# Patient Record
Sex: Female | Born: 1945 | Race: White | Hispanic: No | Marital: Married | Smoking: Former smoker
Health system: Southern US, Community
[De-identification: ages and names within clinical notes are randomized; demographics above are authoritative.]

## PROBLEM LIST (undated history)

## (undated) DIAGNOSIS — N289 Disorder of kidney and ureter, unspecified: Secondary | ICD-10-CM

## (undated) DIAGNOSIS — E039 Hypothyroidism, unspecified: Secondary | ICD-10-CM

## (undated) DIAGNOSIS — F419 Anxiety disorder, unspecified: Secondary | ICD-10-CM

## (undated) DIAGNOSIS — K648 Other hemorrhoids: Secondary | ICD-10-CM

## (undated) DIAGNOSIS — M40209 Unspecified kyphosis, site unspecified: Secondary | ICD-10-CM

## (undated) DIAGNOSIS — N184 Chronic kidney disease, stage 4 (severe): Secondary | ICD-10-CM

## (undated) DIAGNOSIS — M1990 Unspecified osteoarthritis, unspecified site: Secondary | ICD-10-CM

## (undated) DIAGNOSIS — K5790 Diverticulosis of intestine, part unspecified, without perforation or abscess without bleeding: Secondary | ICD-10-CM

## (undated) DIAGNOSIS — I10 Essential (primary) hypertension: Secondary | ICD-10-CM

## (undated) DIAGNOSIS — M549 Dorsalgia, unspecified: Secondary | ICD-10-CM

## (undated) DIAGNOSIS — I4891 Unspecified atrial fibrillation: Secondary | ICD-10-CM

## (undated) DIAGNOSIS — D369 Benign neoplasm, unspecified site: Secondary | ICD-10-CM

## (undated) DIAGNOSIS — F319 Bipolar disorder, unspecified: Secondary | ICD-10-CM

## (undated) DIAGNOSIS — K219 Gastro-esophageal reflux disease without esophagitis: Secondary | ICD-10-CM

## (undated) HISTORY — PX: TUBAL LIGATION: SHX77

## (undated) HISTORY — PX: APPENDECTOMY: SHX54

## (undated) HISTORY — DX: Diverticulosis of intestine, part unspecified, without perforation or abscess without bleeding: K57.90

## (undated) HISTORY — PX: TONSILLECTOMY: SUR1361

## (undated) HISTORY — PX: CHOLECYSTECTOMY: SHX55

## (undated) HISTORY — DX: Hypothyroidism, unspecified: E03.9

## (undated) HISTORY — DX: Unspecified atrial fibrillation: I48.91

## (undated) HISTORY — DX: Benign neoplasm, unspecified site: D36.9

## (undated) HISTORY — DX: Other hemorrhoids: K64.8

---

## 1997-08-05 ENCOUNTER — Encounter: Admission: RE | Admit: 1997-08-05 | Discharge: 1997-11-03 | Payer: Self-pay | Admitting: Neurology

## 1998-01-23 ENCOUNTER — Inpatient Hospital Stay (HOSPITAL_COMMUNITY): Admission: EM | Admit: 1998-01-23 | Discharge: 1998-01-25 | Payer: Self-pay | Admitting: Emergency Medicine

## 1998-01-23 ENCOUNTER — Encounter: Payer: Self-pay | Admitting: General Surgery

## 1998-01-24 ENCOUNTER — Encounter: Payer: Self-pay | Admitting: General Surgery

## 1998-09-04 ENCOUNTER — Encounter: Payer: Self-pay | Admitting: Emergency Medicine

## 1998-09-04 ENCOUNTER — Emergency Department (HOSPITAL_COMMUNITY): Admission: EM | Admit: 1998-09-04 | Discharge: 1998-09-04 | Payer: Self-pay | Admitting: Emergency Medicine

## 1999-01-05 ENCOUNTER — Emergency Department (HOSPITAL_COMMUNITY): Admission: EM | Admit: 1999-01-05 | Discharge: 1999-01-05 | Payer: Self-pay | Admitting: Emergency Medicine

## 1999-01-05 ENCOUNTER — Encounter: Payer: Self-pay | Admitting: Emergency Medicine

## 2000-09-26 ENCOUNTER — Ambulatory Visit (HOSPITAL_COMMUNITY): Admission: RE | Admit: 2000-09-26 | Discharge: 2000-09-26 | Payer: Self-pay | Admitting: Internal Medicine

## 2000-09-26 ENCOUNTER — Encounter (INDEPENDENT_AMBULATORY_CARE_PROVIDER_SITE_OTHER): Payer: Self-pay | Admitting: Specialist

## 2000-10-12 ENCOUNTER — Inpatient Hospital Stay (HOSPITAL_COMMUNITY): Admission: EM | Admit: 2000-10-12 | Discharge: 2000-10-14 | Payer: Self-pay | Admitting: Emergency Medicine

## 2000-10-12 ENCOUNTER — Encounter: Payer: Self-pay | Admitting: Emergency Medicine

## 2003-03-16 ENCOUNTER — Ambulatory Visit (HOSPITAL_COMMUNITY): Admission: RE | Admit: 2003-03-16 | Discharge: 2003-03-16 | Payer: Self-pay | Admitting: Internal Medicine

## 2003-10-20 ENCOUNTER — Ambulatory Visit (HOSPITAL_COMMUNITY): Admission: RE | Admit: 2003-10-20 | Discharge: 2003-10-20 | Payer: Self-pay | Admitting: Internal Medicine

## 2003-11-04 ENCOUNTER — Ambulatory Visit (HOSPITAL_COMMUNITY): Admission: RE | Admit: 2003-11-04 | Discharge: 2003-11-04 | Payer: Self-pay | Admitting: Internal Medicine

## 2004-01-02 ENCOUNTER — Inpatient Hospital Stay (HOSPITAL_COMMUNITY): Admission: EM | Admit: 2004-01-02 | Discharge: 2004-01-20 | Payer: Self-pay | Admitting: Emergency Medicine

## 2004-01-06 ENCOUNTER — Encounter (INDEPENDENT_AMBULATORY_CARE_PROVIDER_SITE_OTHER): Payer: Self-pay | Admitting: Cardiology

## 2004-01-14 ENCOUNTER — Ambulatory Visit: Payer: Self-pay | Admitting: Physical Medicine & Rehabilitation

## 2004-08-24 ENCOUNTER — Ambulatory Visit: Payer: Self-pay | Admitting: Internal Medicine

## 2004-09-08 ENCOUNTER — Observation Stay (HOSPITAL_COMMUNITY): Admission: EM | Admit: 2004-09-08 | Discharge: 2004-09-08 | Payer: Self-pay | Admitting: Emergency Medicine

## 2004-09-15 ENCOUNTER — Emergency Department (HOSPITAL_COMMUNITY): Admission: EM | Admit: 2004-09-15 | Discharge: 2004-09-15 | Payer: Self-pay | Admitting: Emergency Medicine

## 2004-09-16 ENCOUNTER — Emergency Department (HOSPITAL_COMMUNITY): Admission: EM | Admit: 2004-09-16 | Discharge: 2004-09-16 | Payer: Self-pay | Admitting: Emergency Medicine

## 2004-11-29 ENCOUNTER — Ambulatory Visit: Payer: Self-pay | Admitting: Internal Medicine

## 2004-12-06 ENCOUNTER — Ambulatory Visit: Payer: Self-pay | Admitting: Internal Medicine

## 2005-01-23 ENCOUNTER — Ambulatory Visit: Payer: Self-pay | Admitting: Internal Medicine

## 2005-03-26 ENCOUNTER — Ambulatory Visit: Payer: Self-pay | Admitting: Internal Medicine

## 2005-04-04 ENCOUNTER — Ambulatory Visit: Payer: Self-pay | Admitting: Internal Medicine

## 2005-04-13 ENCOUNTER — Ambulatory Visit: Payer: Self-pay | Admitting: Internal Medicine

## 2005-05-04 ENCOUNTER — Encounter: Payer: Self-pay | Admitting: Internal Medicine

## 2005-05-15 ENCOUNTER — Ambulatory Visit: Payer: Self-pay | Admitting: Internal Medicine

## 2005-07-24 ENCOUNTER — Ambulatory Visit: Payer: Self-pay | Admitting: Internal Medicine

## 2005-07-26 ENCOUNTER — Ambulatory Visit: Payer: Self-pay | Admitting: Cardiology

## 2005-08-03 ENCOUNTER — Ambulatory Visit: Payer: Self-pay | Admitting: Internal Medicine

## 2005-08-09 ENCOUNTER — Ambulatory Visit: Payer: Self-pay | Admitting: Internal Medicine

## 2005-10-09 ENCOUNTER — Ambulatory Visit: Payer: Self-pay | Admitting: Internal Medicine

## 2005-11-06 ENCOUNTER — Ambulatory Visit: Payer: Self-pay | Admitting: Internal Medicine

## 2005-12-24 ENCOUNTER — Ambulatory Visit: Payer: Self-pay | Admitting: Internal Medicine

## 2006-01-15 ENCOUNTER — Ambulatory Visit: Payer: Self-pay | Admitting: Internal Medicine

## 2006-01-25 ENCOUNTER — Ambulatory Visit: Payer: Self-pay | Admitting: Internal Medicine

## 2006-01-31 ENCOUNTER — Ambulatory Visit: Payer: Self-pay | Admitting: Internal Medicine

## 2006-02-07 ENCOUNTER — Ambulatory Visit: Payer: Self-pay | Admitting: Internal Medicine

## 2006-02-18 ENCOUNTER — Ambulatory Visit: Payer: Self-pay | Admitting: Internal Medicine

## 2006-02-18 LAB — CONVERTED CEMR LAB
BUN: 33 mg/dL — ABNORMAL HIGH (ref 6–23)
CO2: 23 meq/L (ref 19–32)
Calcium: 9.6 mg/dL (ref 8.4–10.5)
GFR calc non Af Amer: 27 mL/min
Glomerular Filtration Rate, Af Am: 33 mL/min/{1.73_m2}
Potassium: 4.8 meq/L (ref 3.5–5.1)
Pro B Natriuretic peptide (BNP): 34 pg/mL (ref 0.0–100.0)
T3, Free: 2.6 pg/mL (ref 2.3–4.2)

## 2006-02-28 ENCOUNTER — Ambulatory Visit: Payer: Self-pay | Admitting: Internal Medicine

## 2006-03-01 ENCOUNTER — Ambulatory Visit: Payer: Self-pay | Admitting: Internal Medicine

## 2006-03-01 LAB — CONVERTED CEMR LAB
CO2: 20 meq/L (ref 19–32)
Calcium: 9.7 mg/dL (ref 8.4–10.5)
Chloride: 108 meq/L (ref 96–112)
Creatinine, Ser: 1.3 mg/dL — ABNORMAL HIGH (ref 0.4–1.2)
Glomerular Filtration Rate, Af Am: 54 mL/min/{1.73_m2}

## 2006-04-23 ENCOUNTER — Ambulatory Visit: Payer: Self-pay | Admitting: Internal Medicine

## 2006-07-24 ENCOUNTER — Ambulatory Visit: Payer: Self-pay | Admitting: Internal Medicine

## 2006-08-22 ENCOUNTER — Ambulatory Visit: Payer: Self-pay | Admitting: Internal Medicine

## 2006-08-22 LAB — CONVERTED CEMR LAB
BUN: 12 mg/dL (ref 6–23)
CO2: 25 meq/L (ref 19–32)
Calcium: 9.6 mg/dL (ref 8.4–10.5)
Hgb A1c MFr Bld: 6.4 % — ABNORMAL HIGH (ref 4.6–6.0)
Ketones, ur: NEGATIVE mg/dL
Potassium: 4.6 meq/L (ref 3.5–5.1)
Sodium: 138 meq/L (ref 135–145)
Specific Gravity, Urine: <=1.005 (ref 1.000–1.03)
Total Protein, Urine: NEGATIVE mg/dL
Urobilinogen, UA: 0.2 (ref 0.0–1.0)

## 2006-09-02 ENCOUNTER — Ambulatory Visit: Payer: Self-pay | Admitting: Internal Medicine

## 2006-09-18 ENCOUNTER — Ambulatory Visit: Payer: Self-pay | Admitting: Internal Medicine

## 2006-10-30 ENCOUNTER — Ambulatory Visit: Payer: Self-pay | Admitting: Family Medicine

## 2006-11-16 ENCOUNTER — Encounter: Payer: Self-pay | Admitting: Internal Medicine

## 2006-11-16 DIAGNOSIS — E039 Hypothyroidism, unspecified: Secondary | ICD-10-CM | POA: Insufficient documentation

## 2006-11-16 DIAGNOSIS — I10 Essential (primary) hypertension: Secondary | ICD-10-CM | POA: Insufficient documentation

## 2006-11-27 ENCOUNTER — Ambulatory Visit (HOSPITAL_COMMUNITY): Admission: RE | Admit: 2006-11-27 | Discharge: 2006-11-27 | Payer: Self-pay | Admitting: Family Medicine

## 2006-11-27 ENCOUNTER — Ambulatory Visit: Payer: Self-pay | Admitting: Family Medicine

## 2006-12-31 ENCOUNTER — Ambulatory Visit: Payer: Self-pay | Admitting: Internal Medicine

## 2007-02-19 ENCOUNTER — Ambulatory Visit: Payer: Self-pay | Admitting: Internal Medicine

## 2007-02-21 ENCOUNTER — Ambulatory Visit: Payer: Self-pay | Admitting: Internal Medicine

## 2007-02-21 DIAGNOSIS — D509 Iron deficiency anemia, unspecified: Secondary | ICD-10-CM

## 2007-02-21 DIAGNOSIS — F314 Bipolar disorder, current episode depressed, severe, without psychotic features: Secondary | ICD-10-CM | POA: Insufficient documentation

## 2007-02-21 DIAGNOSIS — K5909 Other constipation: Secondary | ICD-10-CM

## 2007-02-21 DIAGNOSIS — M169 Osteoarthritis of hip, unspecified: Secondary | ICD-10-CM

## 2007-02-21 DIAGNOSIS — K219 Gastro-esophageal reflux disease without esophagitis: Secondary | ICD-10-CM

## 2007-02-21 DIAGNOSIS — M479 Spondylosis, unspecified: Secondary | ICD-10-CM | POA: Insufficient documentation

## 2007-02-21 LAB — CONVERTED CEMR LAB: Blood Glucose, Fingerstick: 127

## 2007-02-25 LAB — CONVERTED CEMR LAB
Basophils Absolute: 0 10*3/uL (ref 0.0–0.1)
Basophils Relative: 0 % (ref 0–1)
Eosinophils Absolute: 0.2 10*3/uL (ref 0.0–0.7)
Eosinophils Relative: 2 % (ref 0–5)
HCT: 32.5 % — ABNORMAL LOW (ref 36.0–46.0)
Hemoglobin: 10 g/dL — ABNORMAL LOW (ref 12.0–15.0)
Lymphs Abs: 1.6 10*3/uL (ref 0.7–3.3)
MCV: 92.6 fL (ref 78.0–100.0)
Monocytes Absolute: 1 10*3/uL — ABNORMAL HIGH (ref 0.2–0.7)
Platelets: 271 10*3/uL (ref 150–400)
RDW: 17.7 % — ABNORMAL HIGH (ref 11.5–14.0)

## 2007-03-13 ENCOUNTER — Telehealth (INDEPENDENT_AMBULATORY_CARE_PROVIDER_SITE_OTHER): Payer: Self-pay | Admitting: Internal Medicine

## 2007-03-13 ENCOUNTER — Ambulatory Visit: Payer: Self-pay | Admitting: Internal Medicine

## 2007-03-27 ENCOUNTER — Encounter: Payer: Self-pay | Admitting: Internal Medicine

## 2007-03-27 ENCOUNTER — Ambulatory Visit: Payer: Self-pay | Admitting: Internal Medicine

## 2007-03-27 ENCOUNTER — Encounter (INDEPENDENT_AMBULATORY_CARE_PROVIDER_SITE_OTHER): Payer: Self-pay | Admitting: Internal Medicine

## 2007-03-27 DIAGNOSIS — D369 Benign neoplasm, unspecified site: Secondary | ICD-10-CM

## 2007-03-27 HISTORY — DX: Benign neoplasm, unspecified site: D36.9

## 2007-04-20 ENCOUNTER — Ambulatory Visit: Payer: Self-pay | Admitting: Family Medicine

## 2007-04-20 ENCOUNTER — Observation Stay (HOSPITAL_COMMUNITY): Admission: EM | Admit: 2007-04-20 | Discharge: 2007-04-21 | Payer: Self-pay | Admitting: Emergency Medicine

## 2007-04-20 ENCOUNTER — Encounter (INDEPENDENT_AMBULATORY_CARE_PROVIDER_SITE_OTHER): Payer: Self-pay | Admitting: Internal Medicine

## 2007-04-21 ENCOUNTER — Encounter: Payer: Self-pay | Admitting: Family Medicine

## 2007-04-27 ENCOUNTER — Encounter (INDEPENDENT_AMBULATORY_CARE_PROVIDER_SITE_OTHER): Payer: Self-pay | Admitting: Internal Medicine

## 2007-05-06 ENCOUNTER — Ambulatory Visit: Payer: Self-pay | Admitting: Internal Medicine

## 2007-05-06 ENCOUNTER — Telehealth (INDEPENDENT_AMBULATORY_CARE_PROVIDER_SITE_OTHER): Payer: Self-pay | Admitting: Internal Medicine

## 2007-05-06 DIAGNOSIS — R079 Chest pain, unspecified: Secondary | ICD-10-CM | POA: Insufficient documentation

## 2007-05-06 DIAGNOSIS — E782 Mixed hyperlipidemia: Secondary | ICD-10-CM

## 2007-05-12 ENCOUNTER — Telehealth (INDEPENDENT_AMBULATORY_CARE_PROVIDER_SITE_OTHER): Payer: Self-pay | Admitting: Internal Medicine

## 2007-05-27 ENCOUNTER — Encounter (INDEPENDENT_AMBULATORY_CARE_PROVIDER_SITE_OTHER): Payer: Self-pay | Admitting: Internal Medicine

## 2007-06-02 ENCOUNTER — Encounter (INDEPENDENT_AMBULATORY_CARE_PROVIDER_SITE_OTHER): Payer: Self-pay | Admitting: Nurse Practitioner

## 2007-06-02 ENCOUNTER — Telehealth (INDEPENDENT_AMBULATORY_CARE_PROVIDER_SITE_OTHER): Payer: Self-pay | Admitting: Nurse Practitioner

## 2007-06-11 ENCOUNTER — Telehealth (INDEPENDENT_AMBULATORY_CARE_PROVIDER_SITE_OTHER): Payer: Self-pay | Admitting: Internal Medicine

## 2007-06-17 ENCOUNTER — Encounter (INDEPENDENT_AMBULATORY_CARE_PROVIDER_SITE_OTHER): Payer: Self-pay | Admitting: Nurse Practitioner

## 2007-06-24 ENCOUNTER — Encounter (INDEPENDENT_AMBULATORY_CARE_PROVIDER_SITE_OTHER): Payer: Self-pay | Admitting: Internal Medicine

## 2007-07-01 ENCOUNTER — Encounter (INDEPENDENT_AMBULATORY_CARE_PROVIDER_SITE_OTHER): Payer: Self-pay | Admitting: Nurse Practitioner

## 2007-07-03 ENCOUNTER — Telehealth (INDEPENDENT_AMBULATORY_CARE_PROVIDER_SITE_OTHER): Payer: Self-pay | Admitting: Nurse Practitioner

## 2007-07-06 ENCOUNTER — Emergency Department (HOSPITAL_COMMUNITY): Admission: EM | Admit: 2007-07-06 | Discharge: 2007-07-06 | Payer: Self-pay | Admitting: Emergency Medicine

## 2007-07-08 DIAGNOSIS — M1990 Unspecified osteoarthritis, unspecified site: Secondary | ICD-10-CM | POA: Insufficient documentation

## 2007-07-08 DIAGNOSIS — Z860100 Personal history of colon polyps, unspecified: Secondary | ICD-10-CM | POA: Insufficient documentation

## 2007-07-08 DIAGNOSIS — F329 Major depressive disorder, single episode, unspecified: Secondary | ICD-10-CM

## 2007-07-08 DIAGNOSIS — J438 Other emphysema: Secondary | ICD-10-CM

## 2007-07-08 DIAGNOSIS — D126 Benign neoplasm of colon, unspecified: Secondary | ICD-10-CM

## 2007-07-08 DIAGNOSIS — K5730 Diverticulosis of large intestine without perforation or abscess without bleeding: Secondary | ICD-10-CM | POA: Insufficient documentation

## 2007-07-08 DIAGNOSIS — F411 Generalized anxiety disorder: Secondary | ICD-10-CM | POA: Insufficient documentation

## 2007-07-09 ENCOUNTER — Telehealth (INDEPENDENT_AMBULATORY_CARE_PROVIDER_SITE_OTHER): Payer: Self-pay | Admitting: Internal Medicine

## 2007-07-14 ENCOUNTER — Telehealth (INDEPENDENT_AMBULATORY_CARE_PROVIDER_SITE_OTHER): Payer: Self-pay | Admitting: Internal Medicine

## 2007-07-14 ENCOUNTER — Ambulatory Visit: Payer: Self-pay | Admitting: Nurse Practitioner

## 2007-07-18 ENCOUNTER — Encounter (INDEPENDENT_AMBULATORY_CARE_PROVIDER_SITE_OTHER): Payer: Self-pay | Admitting: Nurse Practitioner

## 2007-07-25 ENCOUNTER — Ambulatory Visit: Payer: Self-pay | Admitting: Internal Medicine

## 2007-07-25 DIAGNOSIS — R42 Dizziness and giddiness: Secondary | ICD-10-CM

## 2007-07-26 ENCOUNTER — Encounter (INDEPENDENT_AMBULATORY_CARE_PROVIDER_SITE_OTHER): Payer: Self-pay | Admitting: Internal Medicine

## 2007-07-26 DIAGNOSIS — N189 Chronic kidney disease, unspecified: Secondary | ICD-10-CM

## 2007-07-28 LAB — CONVERTED CEMR LAB
BUN: 27 mg/dL — ABNORMAL HIGH
CO2: 22 meq/L
Calcium: 9.8 mg/dL
Chloride: 105 meq/L
Creatinine, Ser: 1.44 mg/dL — ABNORMAL HIGH
Glucose, Bld: 85 mg/dL
Potassium: 5.5 meq/L — ABNORMAL HIGH
Sodium: 140 meq/L

## 2007-07-31 ENCOUNTER — Encounter (INDEPENDENT_AMBULATORY_CARE_PROVIDER_SITE_OTHER): Payer: Self-pay | Admitting: Internal Medicine

## 2007-08-04 ENCOUNTER — Telehealth (INDEPENDENT_AMBULATORY_CARE_PROVIDER_SITE_OTHER): Payer: Self-pay | Admitting: Internal Medicine

## 2007-08-12 ENCOUNTER — Telehealth (INDEPENDENT_AMBULATORY_CARE_PROVIDER_SITE_OTHER): Payer: Self-pay | Admitting: Internal Medicine

## 2007-08-13 ENCOUNTER — Telehealth (INDEPENDENT_AMBULATORY_CARE_PROVIDER_SITE_OTHER): Payer: Self-pay | Admitting: *Deleted

## 2007-08-25 ENCOUNTER — Telehealth (INDEPENDENT_AMBULATORY_CARE_PROVIDER_SITE_OTHER): Payer: Self-pay | Admitting: Internal Medicine

## 2007-09-09 ENCOUNTER — Ambulatory Visit: Payer: Self-pay | Admitting: Internal Medicine

## 2007-09-09 DIAGNOSIS — H0520 Unspecified exophthalmos: Secondary | ICD-10-CM | POA: Insufficient documentation

## 2007-09-11 ENCOUNTER — Encounter (INDEPENDENT_AMBULATORY_CARE_PROVIDER_SITE_OTHER): Payer: Self-pay | Admitting: Internal Medicine

## 2007-09-21 ENCOUNTER — Telehealth (INDEPENDENT_AMBULATORY_CARE_PROVIDER_SITE_OTHER): Payer: Self-pay | Admitting: Internal Medicine

## 2007-09-23 LAB — CONVERTED CEMR LAB
ANA Titer 1: 1:40 {titer} — ABNORMAL HIGH
Basophils Absolute: 0 10*3/uL (ref 0.0–0.1)
Basophils Relative: 1 % (ref 0–1)
HCT: 33.7 % — ABNORMAL LOW (ref 36.0–46.0)
Hemoglobin: 10.5 g/dL — ABNORMAL LOW (ref 12.0–15.0)
MCHC: 31.2 g/dL (ref 30.0–36.0)
RBC: 3.56 M/uL — ABNORMAL LOW (ref 3.87–5.11)
RDW: 13.3 % (ref 11.5–15.5)
TSH: 0.277 microintl units/mL — ABNORMAL LOW (ref 0.350–5.50)
WBC: 6.1 10*3/uL (ref 4.0–10.5)

## 2007-09-27 ENCOUNTER — Telehealth: Payer: Self-pay | Admitting: Internal Medicine

## 2007-10-07 ENCOUNTER — Encounter (INDEPENDENT_AMBULATORY_CARE_PROVIDER_SITE_OTHER): Payer: Self-pay | Admitting: Internal Medicine

## 2007-10-27 ENCOUNTER — Encounter (INDEPENDENT_AMBULATORY_CARE_PROVIDER_SITE_OTHER): Payer: Self-pay | Admitting: Internal Medicine

## 2007-10-28 ENCOUNTER — Telehealth: Payer: Self-pay | Admitting: Internal Medicine

## 2007-10-29 ENCOUNTER — Telehealth (INDEPENDENT_AMBULATORY_CARE_PROVIDER_SITE_OTHER): Payer: Self-pay | Admitting: *Deleted

## 2007-10-29 ENCOUNTER — Telehealth: Payer: Self-pay | Admitting: Internal Medicine

## 2007-10-30 ENCOUNTER — Telehealth (INDEPENDENT_AMBULATORY_CARE_PROVIDER_SITE_OTHER): Payer: Self-pay | Admitting: *Deleted

## 2007-12-15 ENCOUNTER — Encounter: Admission: RE | Admit: 2007-12-15 | Discharge: 2008-01-07 | Payer: Self-pay | Admitting: Orthopedic Surgery

## 2008-08-16 ENCOUNTER — Ambulatory Visit: Payer: Self-pay | Admitting: Oncology

## 2008-09-27 ENCOUNTER — Ambulatory Visit: Payer: Self-pay | Admitting: Oncology

## 2008-09-30 LAB — FECAL OCCULT BLOOD, GUAIAC: Occult Blood: NEGATIVE

## 2008-11-23 ENCOUNTER — Ambulatory Visit (HOSPITAL_COMMUNITY): Admission: RE | Admit: 2008-11-23 | Discharge: 2008-11-23 | Payer: Self-pay | Admitting: Oncology

## 2008-11-23 ENCOUNTER — Ambulatory Visit: Payer: Self-pay | Admitting: Oncology

## 2008-11-23 LAB — CBC WITH DIFFERENTIAL/PLATELET
Basophils Absolute: 0 10*3/uL (ref 0.0–0.1)
EOS%: 1.7 % (ref 0.0–7.0)
Eosinophils Absolute: 0.1 10*3/uL (ref 0.0–0.5)
HGB: 9.6 g/dL — ABNORMAL LOW (ref 11.6–15.9)
LYMPH%: 22.4 % (ref 14.0–49.7)
MCH: 30.2 pg (ref 25.1–34.0)
MCV: 89.6 fL (ref 79.5–101.0)
MONO%: 6.1 % (ref 0.0–14.0)
NEUT#: 3.4 10*3/uL (ref 1.5–6.5)
Platelets: 296 10*3/uL (ref 145–400)
RBC: 3.16 10*6/uL — ABNORMAL LOW (ref 3.70–5.45)
RDW: 17.3 % — ABNORMAL HIGH (ref 11.2–14.5)

## 2008-11-23 LAB — SEDIMENTATION RATE: Sed Rate: 49 mm/hr — ABNORMAL HIGH (ref 0–22)

## 2008-11-24 LAB — COMPREHENSIVE METABOLIC PANEL
AST: 9 U/L (ref 0–37)
Albumin: 4 g/dL (ref 3.5–5.2)
Alkaline Phosphatase: 137 U/L — ABNORMAL HIGH (ref 39–117)
BUN: 37 mg/dL — ABNORMAL HIGH (ref 6–23)
Creatinine, Ser: 1.64 mg/dL — ABNORMAL HIGH (ref 0.40–1.20)
Glucose, Bld: 94 mg/dL (ref 70–99)
Potassium: 5.3 mEq/L (ref 3.5–5.3)
Total Bilirubin: 0.2 mg/dL — ABNORMAL LOW (ref 0.3–1.2)

## 2008-11-24 LAB — ERYTHROPOIETIN: Erythropoietin: 14.4 m[IU]/mL (ref 2.6–34.0)

## 2008-12-23 ENCOUNTER — Ambulatory Visit: Payer: Self-pay | Admitting: Oncology

## 2008-12-23 LAB — CHCC SMEAR

## 2008-12-23 LAB — COMPREHENSIVE METABOLIC PANEL
ALT: 15 U/L (ref 0–35)
Alkaline Phosphatase: 117 U/L (ref 39–117)
Creatinine, Ser: 1.6 mg/dL — ABNORMAL HIGH (ref 0.40–1.20)
Glucose, Bld: 91 mg/dL (ref 70–99)
Sodium: 134 mEq/L — ABNORMAL LOW (ref 135–145)
Total Bilirubin: 0.5 mg/dL (ref 0.3–1.2)
Total Protein: 6.4 g/dL (ref 6.0–8.3)

## 2008-12-23 LAB — CBC WITH DIFFERENTIAL/PLATELET
EOS%: 1.3 % (ref 0.0–7.0)
LYMPH%: 18.1 % (ref 14.0–49.7)
MCH: 31.4 pg (ref 25.1–34.0)
MCV: 91.8 fL (ref 79.5–101.0)
MONO%: 8.8 % (ref 0.0–14.0)
Platelets: 229 10*3/uL (ref 145–400)
RBC: 2.86 10*6/uL — ABNORMAL LOW (ref 3.70–5.45)
RDW: 15.9 % — ABNORMAL HIGH (ref 11.2–14.5)

## 2008-12-23 LAB — LACTATE DEHYDROGENASE: LDH: 104 U/L (ref 94–250)

## 2008-12-25 LAB — IRON AND TIBC
%SAT: 12 % — ABNORMAL LOW (ref 20–55)
TIBC: 205 ug/dL — ABNORMAL LOW (ref 250–470)
UIBC: 180 ug/dL

## 2008-12-25 LAB — FERRITIN: Ferritin: 512 ng/mL — ABNORMAL HIGH (ref 10–291)

## 2008-12-29 LAB — IMMUNOFIXATION ELECTROPHORESIS
IgA: 101 mg/dL (ref 68–378)
IgG (Immunoglobin G), Serum: 654 mg/dL — ABNORMAL LOW (ref 694–1618)
IgM, Serum: 29 mg/dL — ABNORMAL LOW (ref 60–263)
Total Protein, Serum Electrophoresis: 6.2 g/dL (ref 6.0–8.3)

## 2009-01-04 LAB — UIFE/LIGHT CHAINS/TP QN, 24-HR UR
Alpha 2, Urine: DETECTED — AB
Beta, Urine: DETECTED — AB
Free Kappa Lt Chains,Ur: 3.56 mg/dL — ABNORMAL HIGH (ref 0.04–1.51)
Free Lt Chn Excr Rate: 131.72 mg/d
Gamma Globulin, Urine: DETECTED — AB
Total Protein, Urine-Ur/day: 204 mg/d — ABNORMAL HIGH (ref 10–140)

## 2009-01-04 LAB — CREATININE CLEARANCE, URINE, 24 HOUR
Collection Interval-CRCL: 24 hours
Creatinine, Urine: 30.7 mg/dL

## 2009-01-17 LAB — COMPREHENSIVE METABOLIC PANEL
ALT: 15 U/L (ref 0–35)
Alkaline Phosphatase: 118 U/L — ABNORMAL HIGH (ref 39–117)
Glucose, Bld: 92 mg/dL (ref 70–99)
Sodium: 139 mEq/L (ref 135–145)
Total Bilirubin: 0.2 mg/dL — ABNORMAL LOW (ref 0.3–1.2)
Total Protein: 6.6 g/dL (ref 6.0–8.3)

## 2009-01-17 LAB — ERYTHROCYTE SEDIMENTATION RATE: Sed Rate: 105 mm/hr — ABNORMAL HIGH (ref 0–30)

## 2009-01-17 LAB — CBC WITH DIFFERENTIAL/PLATELET
EOS%: 1.3 % (ref 0.0–7.0)
MCH: 32 pg (ref 25.1–34.0)
MCV: 92.1 fL (ref 79.5–101.0)
MONO%: 6.2 % (ref 0.0–14.0)
RBC: 2.85 10*6/uL — ABNORMAL LOW (ref 3.70–5.45)
RDW: 14.5 % (ref 11.2–14.5)

## 2009-01-19 LAB — ANTI-NUCLEAR AB-TITER (ANA TITER): ANA Titer 1: 1:40 {titer} — ABNORMAL HIGH

## 2009-01-19 LAB — ANA: Anti Nuclear Antibody(ANA): POSITIVE — AB

## 2009-01-26 ENCOUNTER — Ambulatory Visit: Payer: Self-pay | Admitting: Oncology

## 2009-01-31 LAB — COMPREHENSIVE METABOLIC PANEL
ALT: 15 U/L (ref 0–35)
AST: 9 U/L (ref 0–37)
Albumin: 4.1 g/dL (ref 3.5–5.2)
Alkaline Phosphatase: 115 U/L (ref 39–117)
Glucose, Bld: 97 mg/dL (ref 70–99)
Potassium: 5.3 mEq/L (ref 3.5–5.3)
Sodium: 140 mEq/L (ref 135–145)
Total Protein: 6.5 g/dL (ref 6.0–8.3)

## 2009-01-31 LAB — CBC WITH DIFFERENTIAL/PLATELET
BASO%: 0.4 % (ref 0.0–2.0)
HCT: 26.4 % — ABNORMAL LOW (ref 34.8–46.6)
MCHC: 34.2 g/dL (ref 31.5–36.0)
MONO#: 0.4 10*3/uL (ref 0.1–0.9)
NEUT%: 76.2 % (ref 38.4–76.8)
RDW: 15 % — ABNORMAL HIGH (ref 11.2–14.5)
WBC: 6.5 10*3/uL (ref 3.9–10.3)
lymph#: 1 10*3/uL (ref 0.9–3.3)

## 2009-01-31 LAB — LACTATE DEHYDROGENASE: LDH: 110 U/L (ref 94–250)

## 2009-01-31 LAB — ERYTHROCYTE SEDIMENTATION RATE: Sed Rate: 87 mm/hr — ABNORMAL HIGH (ref 0–30)

## 2009-02-04 ENCOUNTER — Ambulatory Visit: Payer: Self-pay | Admitting: Oncology

## 2009-02-04 ENCOUNTER — Ambulatory Visit (HOSPITAL_COMMUNITY): Admission: RE | Admit: 2009-02-04 | Discharge: 2009-02-04 | Payer: Self-pay | Admitting: Oncology

## 2009-02-04 ENCOUNTER — Encounter (HOSPITAL_COMMUNITY): Payer: Self-pay | Admitting: Oncology

## 2009-11-23 ENCOUNTER — Emergency Department (HOSPITAL_COMMUNITY): Admission: EM | Admit: 2009-11-23 | Discharge: 2009-11-23 | Payer: Self-pay | Admitting: Emergency Medicine

## 2009-11-25 ENCOUNTER — Inpatient Hospital Stay (HOSPITAL_COMMUNITY): Admission: EM | Admit: 2009-11-25 | Discharge: 2009-12-02 | Payer: Self-pay | Admitting: Emergency Medicine

## 2009-11-29 ENCOUNTER — Encounter: Payer: Self-pay | Admitting: Internal Medicine

## 2009-12-06 ENCOUNTER — Ambulatory Visit: Payer: Self-pay | Admitting: Oncology

## 2010-02-22 ENCOUNTER — Emergency Department (HOSPITAL_COMMUNITY): Admission: EM | Admit: 2010-02-22 | Discharge: 2010-02-22 | Payer: Self-pay | Admitting: Emergency Medicine

## 2010-05-26 ENCOUNTER — Encounter
Admission: RE | Admit: 2010-05-26 | Discharge: 2010-05-26 | Payer: Self-pay | Source: Home / Self Care | Attending: Family Medicine | Admitting: Family Medicine

## 2010-05-27 ENCOUNTER — Encounter: Payer: Self-pay | Admitting: Internal Medicine

## 2010-07-22 LAB — URINE CULTURE: Colony Count: >=100000

## 2010-07-22 LAB — BASIC METABOLIC PANEL
BUN: 27 mg/dL — ABNORMAL HIGH (ref 6–23)
BUN: 36 mg/dL — ABNORMAL HIGH (ref 6–23)
BUN: 44 mg/dL — ABNORMAL HIGH (ref 6–23)
CO2: 24 mEq/L (ref 19–32)
CO2: 26 mEq/L (ref 19–32)
Calcium: 8.4 mg/dL (ref 8.4–10.5)
Chloride: 108 mEq/L (ref 96–112)
Chloride: 112 mEq/L (ref 96–112)
Chloride: 112 mEq/L (ref 96–112)
Creatinine, Ser: 1.67 mg/dL — ABNORMAL HIGH (ref 0.4–1.2)
Creatinine, Ser: 1.82 mg/dL — ABNORMAL HIGH (ref 0.4–1.2)
Creatinine, Ser: 1.92 mg/dL — ABNORMAL HIGH (ref 0.4–1.2)
Creatinine, Ser: 2.3 mg/dL — ABNORMAL HIGH (ref 0.4–1.2)
GFR calc Af Amer: 34 mL/min — ABNORMAL LOW (ref >60)
GFR calc non Af Amer: 31 mL/min — ABNORMAL LOW (ref >60)
Glucose, Bld: 100 mg/dL — ABNORMAL HIGH (ref 70–99)
Glucose, Bld: 93 mg/dL (ref 70–99)
Glucose, Bld: 99 mg/dL (ref 70–99)
Potassium: 3.7 mEq/L (ref 3.5–5.1)
Potassium: 4.1 mEq/L (ref 3.5–5.1)

## 2010-07-22 LAB — CBC
HCT: 20.1 % — ABNORMAL LOW (ref 36.0–46.0)
HCT: 21.5 % — ABNORMAL LOW (ref 36.0–46.0)
HCT: 22.2 % — ABNORMAL LOW (ref 36.0–46.0)
HCT: 25 % — ABNORMAL LOW (ref 36.0–46.0)
HCT: 26.5 % — ABNORMAL LOW (ref 36.0–46.0)
HCT: 26.8 % — ABNORMAL LOW (ref 36.0–46.0)
HCT: 27.1 % — ABNORMAL LOW (ref 36.0–46.0)
HCT: 25.1 % — ABNORMAL LOW (ref 36.0–46.0)
HCT: 25.5 % — ABNORMAL LOW (ref 36.0–46.0)
HCT: 26.7 % — ABNORMAL LOW (ref 36.0–46.0)
Hemoglobin: 6.3 g/dL — CL (ref 12.0–15.0)
Hemoglobin: 6.8 g/dL — CL (ref 12.0–15.0)
Hemoglobin: 7.3 g/dL — ABNORMAL LOW (ref 12.0–15.0)
Hemoglobin: 8.3 g/dL — ABNORMAL LOW (ref 12.0–15.0)
Hemoglobin: 9 g/dL — ABNORMAL LOW (ref 12.0–15.0)
Hemoglobin: 9.1 g/dL — ABNORMAL LOW (ref 12.0–15.0)
Hemoglobin: 9.1 g/dL — ABNORMAL LOW (ref 12.0–15.0)
Hemoglobin: 9.2 g/dL — ABNORMAL LOW (ref 12.0–15.0)
MCH: 28.9 pg (ref 26.0–34.0)
MCH: 30.8 pg (ref 26.0–34.0)
MCH: 30.8 pg (ref 26.0–34.0)
MCH: 30.2 pg (ref 26.0–34.0)
MCH: 30.4 pg (ref 26.0–34.0)
MCH: 30.6 pg (ref 26.0–34.0)
MCHC: 33.4 g/dL (ref 30.0–36.0)
MCHC: 33.7 g/dL (ref 30.0–36.0)
MCHC: 33.9 g/dL (ref 30.0–36.0)
MCHC: 33.5 g/dL (ref 30.0–36.0)
MCHC: 33.8 g/dL (ref 30.0–36.0)
MCHC: 33.9 g/dL (ref 30.0–36.0)
MCV: 88.4 fL (ref 78.0–100.0)
MCV: 90.8 fL (ref 78.0–100.0)
MCV: 91.3 fL (ref 78.0–100.0)
MCV: 90 fL (ref 78.0–100.0)
MCV: 90.3 fL (ref 78.0–100.0)
MCV: 90.3 fL (ref 78.0–100.0)
Platelets: 181 10*3/uL (ref 150–400)
Platelets: 184 10*3/uL (ref 150–400)
Platelets: 209 10*3/uL (ref 150–400)
Platelets: 212 10*3/uL (ref 150–400)
Platelets: 227 10*3/uL (ref 150–400)
Platelets: 238 10*3/uL (ref 150–400)
Platelets: 216 10*3/uL (ref 150–400)
Platelets: 218 10*3/uL (ref 150–400)
Platelets: 222 10*3/uL (ref 150–400)
Platelets: 235 10*3/uL (ref 150–400)
RBC: 2.07 MIL/uL — ABNORMAL LOW (ref 3.87–5.11)
RBC: 2.24 MIL/uL — ABNORMAL LOW (ref 3.87–5.11)
RBC: 2.44 MIL/uL — ABNORMAL LOW (ref 3.87–5.11)
RBC: 2.92 MIL/uL — ABNORMAL LOW (ref 3.87–5.11)
RBC: 2.94 MIL/uL — ABNORMAL LOW (ref 3.87–5.11)
RBC: 2.95 MIL/uL — ABNORMAL LOW (ref 3.87–5.11)
RBC: 2.96 MIL/uL — ABNORMAL LOW (ref 3.87–5.11)
RBC: 3.18 MIL/uL — ABNORMAL LOW (ref 3.87–5.11)
RBC: 2.78 MIL/uL — ABNORMAL LOW (ref 3.87–5.11)
RBC: 2.95 MIL/uL — ABNORMAL LOW (ref 3.87–5.11)
RDW: 16.4 % — ABNORMAL HIGH (ref 11.5–15.5)
RDW: 16.5 % — ABNORMAL HIGH (ref 11.5–15.5)
RDW: 16.5 % — ABNORMAL HIGH (ref 11.5–15.5)
RDW: 16.8 % — ABNORMAL HIGH (ref 11.5–15.5)
RDW: 16.9 % — ABNORMAL HIGH (ref 11.5–15.5)
RDW: 16.6 % — ABNORMAL HIGH (ref 11.5–15.5)
RDW: 16.8 % — ABNORMAL HIGH (ref 11.5–15.5)
RDW: 16.9 % — ABNORMAL HIGH (ref 11.5–15.5)
WBC: 10.1 10*3/uL (ref 4.0–10.5)
WBC: 10.3 10*3/uL (ref 4.0–10.5)
WBC: 11 10*3/uL — ABNORMAL HIGH (ref 4.0–10.5)
WBC: 11.1 10*3/uL — ABNORMAL HIGH (ref 4.0–10.5)
WBC: 11.5 10*3/uL — ABNORMAL HIGH (ref 4.0–10.5)
WBC: 11.6 10*3/uL — ABNORMAL HIGH (ref 4.0–10.5)
WBC: 12.2 10*3/uL — ABNORMAL HIGH (ref 4.0–10.5)
WBC: 14.8 10*3/uL — ABNORMAL HIGH (ref 4.0–10.5)
WBC: 18.5 10*3/uL — ABNORMAL HIGH (ref 4.0–10.5)
WBC: 9.9 10*3/uL (ref 4.0–10.5)
WBC: 10.1 10*3/uL (ref 4.0–10.5)
WBC: 9.5 10*3/uL (ref 4.0–10.5)

## 2010-07-22 LAB — DIFFERENTIAL
Basophils Absolute: 0 10*3/uL (ref 0.0–0.1)
Basophils Absolute: 0 10*3/uL (ref 0.0–0.1)
Basophils Relative: 0 % (ref 0–1)
Basophils Relative: 0 % (ref 0–1)
Eosinophils Absolute: 0.1 10*3/uL (ref 0.0–0.7)
Lymphocytes Relative: 1 % — ABNORMAL LOW (ref 12–46)
Monocytes Absolute: 0.1 10*3/uL (ref 0.1–1.0)
Neutro Abs: 12.8 10*3/uL — ABNORMAL HIGH (ref 1.7–7.7)
Neutro Abs: 17.7 10*3/uL — ABNORMAL HIGH (ref 1.7–7.7)
Neutrophils Relative %: 96 % — ABNORMAL HIGH (ref 43–77)

## 2010-07-22 LAB — COMPREHENSIVE METABOLIC PANEL
ALT: 34 U/L (ref 0–35)
Alkaline Phosphatase: 130 U/L — ABNORMAL HIGH (ref 39–117)
Alkaline Phosphatase: 138 U/L — ABNORMAL HIGH (ref 39–117)
BUN: 62 mg/dL — ABNORMAL HIGH (ref 6–23)
Creatinine, Ser: 3.42 mg/dL — ABNORMAL HIGH (ref 0.4–1.2)
Glucose, Bld: 133 mg/dL — ABNORMAL HIGH (ref 70–99)
Glucose, Bld: 95 mg/dL (ref 70–99)
Potassium: 4.3 mEq/L (ref 3.5–5.1)
Potassium: 4.5 mEq/L (ref 3.5–5.1)
Sodium: 139 mEq/L (ref 135–145)
Total Bilirubin: 0.4 mg/dL (ref 0.3–1.2)
Total Protein: 5.4 g/dL — ABNORMAL LOW (ref 6.0–8.3)
Total Protein: 5.6 g/dL — ABNORMAL LOW (ref 6.0–8.3)

## 2010-07-22 LAB — POCT I-STAT, CHEM 8
BUN: 62 mg/dL — ABNORMAL HIGH (ref 6–23)
Calcium, Ion: 1.23 mmol/L (ref 1.12–1.32)
Chloride: 111 mEq/L (ref 96–112)
Chloride: 113 mEq/L — ABNORMAL HIGH (ref 96–112)
HCT: 19 % — ABNORMAL LOW (ref 36.0–46.0)
HCT: 22 % — ABNORMAL LOW (ref 36.0–46.0)
Hemoglobin: 7.5 g/dL — ABNORMAL LOW (ref 12.0–15.0)
Sodium: 130 mEq/L — ABNORMAL LOW (ref 135–145)
TCO2: 14 mmol/L (ref 0–100)

## 2010-07-22 LAB — PREPARE FRESH FROZEN PLASMA

## 2010-07-22 LAB — URINE MICROSCOPIC-ADD ON

## 2010-07-22 LAB — CROSSMATCH: Antibody Screen: NEGATIVE

## 2010-07-22 LAB — BLOOD GAS, ARTERIAL
Acid-base deficit: 9.4 mmol/L — ABNORMAL HIGH (ref 0.0–2.0)
Bicarbonate: 15.7 mEq/L — ABNORMAL LOW (ref 20.0–24.0)
Drawn by: 270091
O2 Content: 2 L/min
O2 Saturation: 96.8 %
pO2, Arterial: 89.7 mmHg (ref 80.0–100.0)

## 2010-07-22 LAB — LACTIC ACID, PLASMA
Lactic Acid, Venous: 0.6 mmol/L (ref 0.5–2.2)
Lactic Acid, Venous: 0.9 mmol/L (ref 0.5–2.2)

## 2010-07-22 LAB — RETICULOCYTES: Retic Count, Absolute: 14.5 10*3/uL — ABNORMAL LOW (ref 19.0–186.0)

## 2010-07-22 LAB — RENAL FUNCTION PANEL
BUN: 49 mg/dL — ABNORMAL HIGH (ref 6–23)
CO2: 19 mEq/L (ref 19–32)
CO2: 21 mEq/L (ref 19–32)
Chloride: 108 mEq/L (ref 96–112)
GFR calc Af Amer: 18 mL/min — ABNORMAL LOW (ref >60)
GFR calc non Af Amer: 15 mL/min — ABNORMAL LOW (ref >60)
Glucose, Bld: 102 mg/dL — ABNORMAL HIGH (ref 70–99)
Glucose, Bld: 110 mg/dL — ABNORMAL HIGH (ref 70–99)
Phosphorus: 4.8 mg/dL — ABNORMAL HIGH (ref 2.3–4.6)
Potassium: 4.2 mEq/L (ref 3.5–5.1)
Potassium: 4.3 mEq/L (ref 3.5–5.1)
Sodium: 140 mEq/L (ref 135–145)
Sodium: 142 mEq/L (ref 135–145)

## 2010-07-22 LAB — TYPE AND SCREEN
ABO/RH(D): O POS
Antibody Screen: NEGATIVE

## 2010-07-22 LAB — URINALYSIS, ROUTINE W REFLEX MICROSCOPIC
Ketones, ur: 15 mg/dL — AB
Nitrite: POSITIVE — AB
Specific Gravity, Urine: 1.013 (ref 1.005–1.030)
Urobilinogen, UA: 1 mg/dL (ref 0.0–1.0)
pH: 5.5 (ref 5.0–8.0)

## 2010-07-22 LAB — TSH: TSH: 0.688 u[IU]/mL (ref 0.350–4.500)

## 2010-07-22 LAB — IRON AND TIBC: Iron: <10 ug/dL — ABNORMAL LOW (ref 42–135)

## 2010-07-22 LAB — HEMOCCULT GUIAC POC 1CARD (OFFICE)
Fecal Occult Bld: NEGATIVE
Fecal Occult Bld: POSITIVE

## 2010-07-22 LAB — CULTURE, BLOOD (ROUTINE X 2)
Culture: NO GROWTH
Culture: NO GROWTH
Culture: NO GROWTH

## 2010-07-22 LAB — VITAMIN B12: Vitamin B-12: 476 pg/mL (ref 211–911)

## 2010-07-22 LAB — APTT
aPTT: 33 seconds (ref 24–37)
aPTT: 43 seconds — ABNORMAL HIGH (ref 24–37)

## 2010-07-22 LAB — PREPARE RBC (CROSSMATCH)

## 2010-07-22 LAB — ABO/RH: ABO/RH(D): O POS

## 2010-07-22 LAB — PROTIME-INR
INR: 1.44 (ref 0.00–1.49)
INR: 1.58 — ABNORMAL HIGH (ref 0.00–1.49)

## 2010-07-22 LAB — GC/CHLAMYDIA PROBE AMP, GENITAL: Chlamydia, DNA Probe: NEGATIVE

## 2010-07-22 LAB — PROCALCITONIN
Procalcitonin: 28.56 ng/mL
Procalcitonin: 45.17 ng/mL

## 2010-07-22 LAB — FERRITIN: Ferritin: 498 ng/mL — ABNORMAL HIGH (ref 10–291)

## 2010-07-22 LAB — MAGNESIUM: Magnesium: 1.7 mg/dL (ref 1.5–2.5)

## 2010-07-22 LAB — WET PREP, GENITAL: Clue Cells Wet Prep HPF POC: NONE SEEN

## 2010-08-10 LAB — DIFFERENTIAL
Basophils Absolute: 0 10*3/uL (ref 0.0–0.1)
Eosinophils Absolute: 0.1 10*3/uL (ref 0.0–0.7)
Eosinophils Relative: 2 % (ref 0–5)
Lymphocytes Relative: 25 % (ref 12–46)

## 2010-08-10 LAB — BONE MARROW EXAM

## 2010-08-10 LAB — CBC
HCT: 27.6 % — ABNORMAL LOW (ref 36.0–46.0)
MCV: 93.5 fL (ref 78.0–100.0)
Platelets: 260 10*3/uL (ref 150–400)
RDW: 15.1 % (ref 11.5–15.5)

## 2010-08-18 ENCOUNTER — Other Ambulatory Visit: Payer: Self-pay | Admitting: Neurology

## 2010-08-18 DIAGNOSIS — R42 Dizziness and giddiness: Secondary | ICD-10-CM

## 2010-08-18 DIAGNOSIS — Z5181 Encounter for therapeutic drug level monitoring: Secondary | ICD-10-CM

## 2010-08-18 DIAGNOSIS — R269 Unspecified abnormalities of gait and mobility: Secondary | ICD-10-CM

## 2010-08-29 ENCOUNTER — Ambulatory Visit
Admission: RE | Admit: 2010-08-29 | Discharge: 2010-08-29 | Disposition: A | Discharge status: Home / Self Care | Payer: Medicare Other | Source: Ambulatory Visit | Attending: Neurology | Admitting: Neurology

## 2010-08-29 DIAGNOSIS — R42 Dizziness and giddiness: Secondary | ICD-10-CM

## 2010-08-29 DIAGNOSIS — Z5181 Encounter for therapeutic drug level monitoring: Secondary | ICD-10-CM

## 2010-08-29 DIAGNOSIS — R269 Unspecified abnormalities of gait and mobility: Secondary | ICD-10-CM

## 2010-09-19 NOTE — Assessment & Plan Note (Signed)
[facility]                                 ON-CALL NOTE   ESCARLET, SAATHOFF                         MRN:          [id number]  DATE:03/26/2007                            DOB:          March 02, 1946    Ms. Kittrell called the answering service twice tonight.  She is prepping  for a colonoscopy tomorrow with Dr. Marina Goodell.  She has taken half of her  TriLyte and is to drink the other half in the morning.  She has only had  1 or 2 bowel movements.  She took an enema without benefit.  She has  chronic constipation problems.  I told her to take 4 Dulcolax tonight  and then resume her TriLyte in the morning at about 7 a.m. and to call  us back if she was not having good results.  She called a second time  asking if she should take an enema in the morning, and I told her to  follow with the plan that I had outlined and to call us, and then we  would make further recommendations.     Iva Boop, MD,FACG  Electronically Signed    CEG/MedQ  DD: 03/26/2007  DT: 03/27/2007  Job #: [id number]   cc:   Wilhemina Bonito. Marina Goodell, MD

## 2010-09-19 NOTE — Discharge Summary (Signed)
[name], [name] NO.:  [id number]   MEDICAL RECORD NO.:  [id number]          PATIENT TYPE:  INP   LOCATION:  2004                         FACILITY:  [facility]   PHYSICIAN:  Levander Campion, M.D.  DATE OF BIRTH:  1945/09/13   DATE OF ADMISSION:  04/20/2007  DATE OF DISCHARGE:  04/21/2007                               DISCHARGE SUMMARY   ADMISSION DIAGNOSES:  Chest pain, rule out myocardial infarction (MI),  hypertension, gastroesophageal reflux disease (GERD), hypothyroidism,  right degenerative hip disease, bipolar disorder.   DISCHARGE DIAGNOSES:  Chest pain, rule out myocardial infarction (MI),  hypertension, hypothyroidism, gastroesophageal reflux disease (GERD),  right degenerative hip disease, bipolar disorder.   CONSULTS:  None.   PROCEDURE:  An echocardiogram done on April 21, 2007 which showed  hyperdynamic left ventricle.   HISTORY AND PHYSICAL EXAM:  The patient was admitted for chest pain  starting at 7 a.m. on December 14th.  The pain is described as an ache  with heaviness that travels from the right to the left shoulder and this  is not associated with shortness of breath, palpitations, or  diaphoresis.  This is not relieved by rest or exacerbated by movement.   VITALS ON ADMISSION:  Temp 97.1, pulse 52, blood pressure 151/79,  respiratory 20, and pulse ox 94% at room air.  Physical exam during  admission was benign.  GENERAL:  The patient is awake, alert, in no apparent distress.  HEENT:  No icterus.  HEART EXAM:  Regular rate and rhythm.  No murmur.  External tenderness  to palpation and from the left and right pectoral.  LUNGS:  Clear to auscultation bilaterally.  ABDOMEN:  Soft.  Positive bowel sounds.  No masses.  The patient is  obese.  EXTREMITY EXAM:  No cyanosis, clubbing, or edema.  NEURO EXAM:  Cranial nerves II through XII grossly intact.  Normal  affect.  The patient alert and oriented times three.   EKG on admission  showed normal sinus rhythm with normal axis.  There is  an inverted T wave in leads I, AVL, and V1.  During the course of  hospitalization, the patient's cardiac implants were negative.  D-dimer  negative.  BNP was 116.  Hemoglobin 10.4 with MCV of 88.  BUN 34,  creatinine 1.4.  She had a UA that was nitrite positive and showed  moderate leukocytes.  The patient was asymptomatic __________  of  dysuria.   Chest x-ray on admission showed cardiomegaly with no edema.  During  course of hospitalization, the patient's MI was ruled out with negative  enzymes.  Also had a lipid panel that showed cholesterol of 207,  triglycerides of 408.  With triglycerides over 400, we were not able to  determine the patient's LDL.  We recommend that the patient be seen at  followup and recommend her being started on a statin to lower her  cholesterol and lower her cardiac risk.  For hypertension, the patient's  hypertension was well-controlled here with blood pressures from 129 to  131 over 59 to 74.  Of note, her creatinine  was 1.4 on admission and  1.21 on day of discharge with the creatinine level trending down.  The  patient most likely will try on admission.  This will continue to  follow.  We will not do anything at this time.  Also during workup the  patient's hemoglobin was 11.2 on admission, down to 10.8 on discharge,  and peripheral smear showed leucocytes and __________ .  This is also  something that she can be followed up on as an outpatient with her  primary care.  Sodium 139, potassium 4.1, chloride 111, bicarb 22, BUN  18, creatinine, 1.21, glucose 129, calcium 9.5, total bilirubin 0.6,  alkaline phosphatase 147, total protein 7.1, AST 17, ALT 16, albumin  3.9.  Her U micro showed a few bacteria.  We decided not to treat  because the patient was asymptomatic.  The  discharge lab showed white  cell count 7, hemoglobin 10.8, hematocrit 31.9, platelets 338.  We feel  that the patient can benefit  from an outpatient stress test that can be  done from her primary care, Dr. Delrae Alfred.   DISCHARGE CONDITION:  Stable.   DISPOSITION:  The patient is discharged to home.  She lives at an  assisted living facility.   Her medications on discharge include:  1. Benzatropine 0.5 mg p.o. daily.  2. Amlodipine 10 mg p.o. daily.  3. Risperidone 2 mg p.o. t.i.d.  4. Lamictal 200 mg p.o. daily.  5. Oxcabavepine 300 mg p.o. t.i.d., and oxcabavepine 900 mg p.o.      q.h.s.  6. Omeprazole 20 mg p.o. daily.  7. Diovan 320 mg p.o. daily.  8. Alprazolam 1 mg p.o. t.i.d.   PATIENT INSTRUCTIONS:  The patient can increase activity slowly.  Her  diet should be heart healthy diet.  There are no __________ .  If  symptoms or want further treatment, the patient can come back to the  Emergency Room.  The patient is to follow up with Dr. Delrae Alfred at  [facility] on May 06, 2007 at 9:45.  The phone number there is [phone].  All the notes for this admission plus the echo and her labs will  be forwarded to Dr. Delrae Alfred at [facility].      Levander Campion, M.D.  Electronically Signed     Levander Campion, M.D.  Electronically Signed    JH/MEDQ  D:  04/21/2007  T:  04/22/2007  Job:  [id number]

## 2010-09-19 NOTE — Assessment & Plan Note (Signed)
[facility]                         GASTROENTEROLOGY OFFICE NOTE   NAME:[name], Doutt                       MRN:          [id number]  DATE:02/19/2007                            DOB:          [birth year]    REASON FOR CONSULTATION:  Surveillance colonoscopy and chronic  constipation.   HISTORY:  This is a 65 year old white female with bipolar disorder,  morbid obesity, thyroid disorder, adenomatous colon polyps, degenerative  joint disease, and chronic constipation.  She underwent colonoscopy in  May 2002, to evaluate change in bowel habits.  At that time, she was  found to have internal hemorrhoids, left sided diverticulosis, and  multiple colon polyps.  Her preparation was deemed fair.  Colon polyps  were found to be tubulovillous adenomas with high grade dysplasia as  well as tubulovillous adenoma.  Followup in 1 year recommended.  The  patient was sent a recall letter but failed to respond.  She was next seen in the office on July 24, 2005, as a self referral  for abdominal pain.  She was felt to have possible diverticulitis and  was treated with ciprofloxacin.  Screening laboratories were  unremarkable.  Contrast-enhanced CT scan of the abdomen and pelvis was  negative for acute abnormalities.  Known diverticulosis was noted,  though no obvious diverticulitis.  She was seen in followup and had  improved on medical therapy.  She was felt to have probable mild  diverticulitis that resolved.  Alternative diagnoses included benign  constipation or spasm from diverticulosis.  We did again discuss  colonoscopy at that time but it was not clear that she was adequately  fit.  The patient had problems with constipation this summer.  She had  an abdominal films at the hospital in July that showed a large amount of  retained stool.  Dr. Barbaraann Barthel referred her to this office for management  of constipation and surveillance colonoscopy.  The patient cancelled  her  appointment in early August as well as mid September.  She now presents.  She has multiple questions regarding her constipation.  She tells me  that she takes Dulcolax and periodic enemas with good results.  She  asked me what she might take should she have severe constipation.  Currently no other active GI complaints.  No abdominal pain.  No  bleeding.  We discussed the importance of surveillance colonoscopy.   PAST MEDICAL HISTORY:  As above.   PAST SURGICAL HISTORY:  1. Cholecystectomy.  2. Tubal ligation.  3. Appendectomy.   ALLERGIES:  No known drug allergies.   CURRENT MEDICATIONS:  Xanax, Lamictal, Diovan, Norvasc, labetalol, and  Trileptal.   SOCIAL HISTORY:  The patient is single, lives alone and currently  unemployed.  She is a reformed smoker and denies alcohol use.   FAMILY HISTORY:  Negative for gastrointestinal malignancy.   PHYSICAL EXAMINATION:  GENERAL:  Obese female who ambulates with a  walker.  She is alert and oriented and in no acute distress.  VITAL SIGNS:  Her blood pressure is 132/82, heart rate is 80, weight is  227 pounds.  HEENT:  Sclerae are anicteric.  LUNGS:  Clear.  HEART:  Regular.  ABDOMEN:  Obese and soft without tenderness.  Good bowel sounds heard.   IMPRESSION:  1. Chronic constipation.  2. 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  3. Diverticulosis.  4. General medical problems, including psychiatric disease.   RECOMMENDATIONS:  1. I discussed in detail today with the patient multiple bowel      regimens to address her constipation.  We discussed fiber, PEG      solutions, laxatives and enemas.  At this point, the patient is      content to continue with Dulcolax and enemas.  She is not      interested in [place] stating it was not helpful in the past.  We      did discuss the use of magnesium citrate for severe constipation as      a  rescue method.  2. Discussed the importance of screening colonoscopy.  The patient      states that she is not interested in having the exam performed at      this time.  Her reason is reported as being her mother's illness      and her fear that colonoscopy will make her own bipolar disorder      worse.  I told her that she is at risk for colon cancer and is well      over due to have had this exam performed.  She clearly acknowledges      and accepts the responsibility.  3. Return to the care of Dr. Barbaraann Barthel at [facility]     Wilhemina Bonito. Marina Goodell, MD  Electronically Signed    JNP/MedQ  DD: 02/19/2007  DT: 02/20/2007  Job #: [id number]   cc:   Fanny Dance. Rankins, M.D.

## 2010-09-22 NOTE — H&P (Signed)
[name], Mcmahon NO.:  [id number]   MEDICAL RECORD NO.:  [id number]          PATIENT TYPE:  INP   LOCATION:  5705                         FACILITY:  [facility]   PHYSICIAN:  Theone Stanley, MD   DATE OF BIRTH:  1945/09/07   DATE OF ADMISSION:  09/07/2004  DATE OF DISCHARGE:                                HISTORY & PHYSICAL   CHIEF COMPLAINT:  Weakness, bilateral leg pain.   HISTORY OF PRESENT ILLNESS:  [name] is a 65 year old female with history  of bipolar disorder, hypothyroidism, and hypertension presenting with a two  to three-month history of bilateral lower extremity pain.  Her pain is  constant.  She states it is both legs.  The whole leg is bothering her.  She  describes it as an achy, dull pain.  She states ibuprofen helps.  Standing  makes it worse.  She denies any numbness or tingling in her lower  extremities.  She does state she has increasing swelling in her ankles x1  week.  However, she normally does have some swelling bilaterally.  In  addition, she has complaints of weakness which is progressively worse.  She  states that she was able to walk and drive without any difficulty and now  uses cane and now has to use a walker.  She cannot ambulate without assist.  She feels unsteady when she gets up.  She states if she were to fall her  legs do not have enough energy or strength to pick herself up.  She has a  difficult time turning over in bed.  Some cold intolerance.  No incontinence  of bowel or bladder.  Patient is somewhat difficult to interview secondary  to her agitation.  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  She apparently, according to her sister,  is in a manic phase and they have recently switched her psychiatrist to Dr.  Jacqulyn Bath.  She was recently started in Trileptal about a week ago.   PAST MEDICAL HISTORY:  1.  Bipolar disorder.  2.  Hypothyroidism.  3.  Hypertension.  4.  She  does have a history of weakness in her left leg which was noted in      August of 2005.  An MRI scan of the lumbar and left hip were      unremarkable.  5.  There is mention of mental retardation in previous records; however, the      sister states this is untrue.  6.  Patient was admitted in August 2005 with lithium toxicity.  7.  She has some history of chronic renal insufficiency.   MEDICATIONS:  1.  Risperdal 3 mg one p.o. t.i.d.  2.  Xanax 0.5 mg q.i.d.  3.  Synthroid 88 mcg daily.  4.  Diovan 80 mg daily.  5.  Trileptal 300 mg t.i.d.  6.  Requip 1 mg daily.  7.  Multivitamin.  8.  Ibuprofen.   ALLERGIES:  No known drug allergies.   FAMILY HISTORY:  Stroke, hypothyroidism, depression, normal pressure  hydrocephalus.  SOCIAL HISTORY:  Patient lives in [place] at [facility] assisted  living.  She has her own apartment and gets minimal help.  She has never  been married, has no children.  She used to be a heavy smoker of two pack  per day x30 years, but quit in September 2005.  No alcohol or illicit drug  use.   PAST SURGICAL HISTORY:  Tonsillectomy and tubal ligation.   REVIEW OF SYSTEMS:  Please see HPI, otherwise all other systems were  negative.   PHYSICAL EXAMINATION:  VITAL SIGNS:  Temperature 97.1, blood pressure  152/83, pulse 95, respiratory rate 20, saturating 94% on room air.  HEENT:  Head atraumatic, normocephalic.  Eyes 3 mm.  Pupils equal, reactive  to light.  Extraocular movements intact.  Ears and nose no discharge.  Throat clear.  Mucosa appears slightly dry.  NECK:  Supple, no lymphadenopathy.  HEART:  Regular rate and rhythm.  A 2/6 systolic murmur is heard best at the  apex.  LUNGS:  Clear to auscultation bilaterally.  ABDOMEN:  Obese, soft, nontender, nondistended.  EXTREMITIES:  Patient had evidence of venous insufficiency more on the left  compared to the right.  1-2+ pitting edema.  NEUROLOGIC:  Patient was alert and oriented x3.   Patient had 4/5 strength in  upper extremities, 4/5 on the right leg, 3 on the left.  Cranial nerves II-  XII were intact.  Patient was able to get out of bed at a very slow pace.  When she got up to walk she needed assistance and walked with a very short  gait and stated that she had more pain on her right in the lower leg  compared to the left.  Patient did not have any cogwheeling present.  SKIN:  Dry.  No rashes appreciated.  Patient did have some discoloration of  her left leg consistent with venous insufficiency.  GENITOURINARY:  Deferred.   LABORATORIES/RADIOLOGY:  Sodium 141, potassium 4, chloride 110, BUN 13,  glucose 104.  Hemoglobin 10, hematocrit 31.  Creatinine 1.1, albumin 3.4,  AST 21, ALT 20, alkaline phosphatase 196.  Total direct and indirect were  all normal.  Magnesium was 2.  Beta natriuretic peptide was less than 30.  A  CT of the head did not show any acute processes, some left maxillary sinus  disease.  EKG showed normal sinus rhythm.  No Q-waves.  No ST abnormalities.  Patient did have inverted T-waves in AVR and AVL.   ASSESSMENT/PLAN:  1.  Disambulation with bilateral leg pain and weakness.  Exact etiology is      unclear at this point in time.  Patient was started in Trileptal.      However, that was a week ago.  It does have side effects of abnormal      gait, ataxia, fatigue, psychomotor slowing, and impaired coordination.      Will obtain an x-ray of the right lower leg.  Will also ask neurology to      come by and see the patient.  Ask physical therapy and occupational      therapy consult.  Will also ask Dr. Jeanie Sewer to come by to help out      with management of her psychiatric issues to see if there might be a      psychological underlying issue.  Her hemoglobin and hematocrit were low      and she had an elevated alkaline phosphatase.  Will obtain a formal CBC;  however, this is concerning for possible malignancy.  She does have an      extensive  smoking history.  Will obtain a chest x-ray at this point in      time.  A breast examination did not reveal any obvious masses and      patient states she has had a mammogram which was negative.  She has also      had a colonoscopy a couple years ago and subsequent follow-up with      [facility].  They wanted her to come back because they obtained a couple      polyps at that time.  However, she was not able to do that secondary to      all her issues with weakness and leg pain.  This might need to be      addressed.  However, obviously this can be done as an outpatient.  2.  Bipolar disorder.  Continue on all her medications.  Will ask Dr.      Jeanie Sewer to come by to help out with these medications and give his      input in regards to this Trileptal.  Although it has the side effects of      abnormal gait, the timeline does not correlate.  3.  Hypertension.  Will continue with the Diovan at this point in time.  4.  Hypothyroidism.  She did mention some increased cold intolerance.  Will      check a TSH.  Otherwise, will keep her Synthroid at 88 mcg.  5.  Prophylaxis.  Lovenox and Protonix will be started.      AEJ/MEDQ  D:  09/08/2004  T:  09/08/2004  Job:  [id number]

## 2010-09-22 NOTE — Assessment & Plan Note (Signed)
[facility]                                 ON-CALL NOTE   NAME:LEWISLevie, [name]                         MRN:          [id number]  DATE:10/30/2007                            DOB:          June 05, 1945    TELEPHONE NUMBER:  [phone].   TIME OF CALL:  07:58 p.m.   The patient is the caller.   HISTORY:  [name] is a patient of mine.  She has been calling the  office this week complaining of constipation.  She has been using  MiraLax.  She calls tonight saying that she just feels bad tells me  that she has been dizzy.  She is anxious.  She tried to call her  psychiatrist who did not call her back.  She has not seen her family  doctor.  She tells me she has not had a bowel movement in a week.  She  denies nausea, vomiting, or abdominal pain.  She is concerned that the  MiraLax may be having a reaction towards her body.  I tried to reassure  her that without nausea, vomiting or abdominal pain, that she does not  need to get fixated on her bowels.  She could continue to use MiraLax  regularly and I certainly would anticipate bowel movement at some point.  I asked her to check for impaction.  She was still quite anxious.  I  told her that if she felt that she needed to go to the emergency room  for evaluation by the emergency room personnel, that would be  reasonable.  I also told her to call her family physician for further  advice and evaluation.  She was agreeable.     Wilhemina Bonito. Marina Goodell, MD  Electronically Signed    JNP/MedQ  DD: 10/30/2007  DT: 10/31/2007  Job #: [id number]

## 2010-09-22 NOTE — Consult Note (Signed)
NAME:  [name], [name] NO.:  [id number]   MEDICAL RECORD NO.:  [id number]                   PATIENT TYPE:  INP   LOCATION:  2116                                 FACILITY:  [facility]   PHYSICIAN:  Marolyn Hammock. Thad Ranger, M.D.           DATE OF BIRTH:  10-21-1945   DATE OF CONSULTATION:  01/05/2004  DATE OF DISCHARGE:                                   CONSULTATION   REQUESTING PHYSICIAN:  Geoffry Paradise, M.D.   REASON FOR CONSULTATION:  Altered mental status.   HISTORY OF PRESENT ILLNESS:  This is the initial inpatient consultation  evaluation of this 65 year old woman with past medical history which  includes bipolar disorder and mental retardation.  The patient was admitted  on August 28 with generalized weakness and nausea.  She had a CT of the  abdomen and pelvis which was unremarkable. She was subsequently found to  have an extremely toxic lithium level of 3.7.  She was admitted for  observation, and her lithium has been held.  The does of her maintenance  Risperdal was also reduced.  Over the past couple of days, she has become  more lethargic and less responsive.  Today she was found in bed minimally  responsive to pain, unable to converse or answer questions which, although  she was lethargic on admission, did represent a change.  Currently  electrolytes have become markedly deranged with a serum sodium level this  morning of 173 with associated markedly elevated urine output in the 5000 ml  for 24-hour range.  She also has been febrile throughout her admission.   She underwent MRI of the brain this morning, which I did review, and  subsequently was transferred to the intensive care unit for close  management.  Neurologic consultation was requested for evaluation of  encephalopathy with consideration for possible lumbar puncture.   PAST MEDICAL HISTORY:  1.  Bipolar disorder.  2.  Mental retardation as above.  3.  She apparently has had some  recent leg pain and weakness which was      worked up with an MRI of the L spine and left hip which was      unremarkable.  4.  She has had some hypercalcemia for the past few months of uncertain      etiology with a negative bone scan and parathyroid scan.  5.  History of hypothyroidism.  6.  History of hypertension.   FAMILY HISTORY, SOCIAL HISTORY, REVIEW OF SYSTEMS:  As outlined in admission  H&P by Dr. Eloise Harman on January 02, 2004.   PAST MEDICAL HISTORY:  Prior to admission, she was taking:  1.  Xanax 0.5 mg t.i.d.  2.  Lithium 300 mg t.i.d.  3.  Levoxyl 0.1 mg daily.  4.  Diovan 80 mg daily.  5.  Risperdal 3 mg b.i.d.   Present regimen of medications includes:  1.  Synthroid at the same  dose.  2.  Risperdal 1 mg q.a.m., 2 mg q.h.s.  3.  Rocephin 1 g IV q.24h.  4.  Ativan p.r.n.  5.  Tylenol p.r.n.   PHYSICAL EXAMINATION:  VITAL SIGNS:  Temperature 101.0, blood pressure  140/50, pulse 105, respirations 36.  GENERAL:  This is a deeply stuporous woman lying supine in hospital bed in  no evident distress.  HEENT:  Cranium is normocephalic and atraumatic.  Oropharynx benign.  Mucous  membranes are very dry.  NECK: Supple.  No carotid bruits are heard.  HEART:  Regular rate and rhythm without murmurs.  NEUROLOGIC:  Mental Status:  She grunts to deep pain and demonstrates some  avoiding behavior but otherwise is not responsive to voice or stimuli.  She  does not make efforts to speak except for some groaning.  Cranial Nerves:  Pupils are equal and briskly reactive.  She is able to move her eyes  reflexively in all directions with some nystagmoid jerking movements.  Eye  __________, corneal, and gag reflexes are all present.  Motor:  Normal bulk,  slightly increased tone, with little cogwheeling versus a fine tremor.  There is no real withdrawal to pain and no spontaneous movement of any  extremity.  Reflexes are 2+ upper extremities, 1+ lower extremities.  Toes  are  downgoing.  Cerebellar and gait examinations could not be performed due  to her altered mental status.   LABORATORY AND X-RAY DATA:  CBC:  White count today 10.5, down from 14.3 on  admission.  Hemoglobin today 12.9, up from 12.2 on admission.  CMET  remarkable for markedly elevated sodium of 173, markedly elevated chloride  of greater than 130, minimally elevated AST and ALT at 41 and 43,  respectively.  BUN and creatinine are 21 and 2.0, respectively, mildly  elevated glucose of 115, elevated calcium at 11.6 with low albumin of 2.9.  Lithium level was 3.71 at admit, today down to 1.94 which is still elevated.  ABG from this morning showed pH 7.27, PCO2 of 39, PO2 of 60, bicarb 18.  Urinalysis on August 28 demonstrated moderate leukocytes.  Urinalysis  yesterday demonstrated no evidence of UTI.  Blood cultures done yesterday  were negative.   I personally reviewed the MRI of the brain performed earlier today and to my  eye demonstrates mild atrophy without any evidence of acute problems and no  real stigmata of white matter disease.  There are no findings suggestive of  meningitis on the scan.   IMPRESSION:  Toxic metabolic encephalopathy.  This most likely is due to her  profound electrolyte abnormalities including hypernatremia and hypercalcemia  with possible associated component of drug effect, perhaps a sepsis.  I do  not believe that she has meningitis given that she has no nuchal rigidity, a  normal white count, history of receiving empiric Rocephin, and other  adequate explanations for all her neurologic symptoms.   RECOMMENDATIONS:  1.  She obviously needs aggressive correction of her electrolyte      abnormalities.  2.  Would continue Rocephin for now, but I think we can defer a lumbar      puncture for now given the penetration of ceftriaxone into the fluid      would inhibit culture growth, and the severe dehydration in and of     itself may cause a pleocytosis which may  not necessarily be associated      with infection, and thus the study might provide some confusing results.  3.  Would also suggest holding the Ativan and Risperdal right now given her      mental status.  4.  Neuroleptic malignant syndrome is a remote consideration, although given      that she does not have muscle rigidity, I think this is quite unlikely.   Will follow with you.  Thank you for the consultation.                                               Michael L. Thad Ranger, M.D.    MLR/MEDQ  D:  01/05/2004  T:  01/05/2004  Job:  [id number]   cc:   Geoffry Paradise, M.D.  [address]  [place]  [place] [address]  Fax: [phone]

## 2010-11-07 ENCOUNTER — Emergency Department (HOSPITAL_COMMUNITY): Payer: Medicare Other

## 2010-11-07 ENCOUNTER — Inpatient Hospital Stay (HOSPITAL_COMMUNITY)
Admission: EM | Admit: 2010-11-07 | Discharge: 2010-11-09 | DRG: 392 | Disposition: A | Discharge status: Nursing Home (Includes ICF) | Payer: Medicare Other | Attending: Internal Medicine | Admitting: Internal Medicine

## 2010-11-07 DIAGNOSIS — K5900 Constipation, unspecified: Secondary | ICD-10-CM | POA: Diagnosis present

## 2010-11-07 DIAGNOSIS — Y92009 Unspecified place in unspecified non-institutional (private) residence as the place of occurrence of the external cause: Secondary | ICD-10-CM

## 2010-11-07 DIAGNOSIS — F319 Bipolar disorder, unspecified: Secondary | ICD-10-CM | POA: Diagnosis present

## 2010-11-07 DIAGNOSIS — N289 Disorder of kidney and ureter, unspecified: Secondary | ICD-10-CM | POA: Diagnosis present

## 2010-11-07 DIAGNOSIS — T474X5A Adverse effect of other laxatives, initial encounter: Secondary | ICD-10-CM | POA: Diagnosis present

## 2010-11-07 DIAGNOSIS — N3000 Acute cystitis without hematuria: Secondary | ICD-10-CM | POA: Diagnosis present

## 2010-11-07 DIAGNOSIS — E785 Hyperlipidemia, unspecified: Secondary | ICD-10-CM | POA: Diagnosis present

## 2010-11-07 DIAGNOSIS — E039 Hypothyroidism, unspecified: Secondary | ICD-10-CM | POA: Diagnosis present

## 2010-11-07 DIAGNOSIS — K219 Gastro-esophageal reflux disease without esophagitis: Secondary | ICD-10-CM | POA: Diagnosis present

## 2010-11-07 DIAGNOSIS — F411 Generalized anxiety disorder: Secondary | ICD-10-CM | POA: Diagnosis present

## 2010-11-07 DIAGNOSIS — R1084 Generalized abdominal pain: Principal | ICD-10-CM | POA: Diagnosis present

## 2010-11-07 DIAGNOSIS — N189 Chronic kidney disease, unspecified: Secondary | ICD-10-CM | POA: Diagnosis present

## 2010-11-07 LAB — COMPREHENSIVE METABOLIC PANEL
Alkaline Phosphatase: 167 U/L — ABNORMAL HIGH (ref 39–117)
BUN: 42 mg/dL — ABNORMAL HIGH (ref 6–23)
CO2: 23 mEq/L (ref 19–32)
Chloride: 105 mEq/L (ref 96–112)
Creatinine, Ser: 2.16 mg/dL — ABNORMAL HIGH (ref 0.50–1.10)
GFR calc Af Amer: 28 mL/min — ABNORMAL LOW (ref >60)
GFR calc non Af Amer: 23 mL/min — ABNORMAL LOW (ref >60)
Glucose, Bld: 81 mg/dL (ref 70–99)
Potassium: 5.1 mEq/L (ref 3.5–5.1)
Total Bilirubin: 0.2 mg/dL — ABNORMAL LOW (ref 0.3–1.2)

## 2010-11-07 LAB — DIFFERENTIAL
Basophils Absolute: 0 10*3/uL (ref 0.0–0.1)
Basophils Relative: 1 % (ref 0–1)
Eosinophils Relative: 2 % (ref 0–5)
Monocytes Absolute: 0.6 10*3/uL (ref 0.1–1.0)

## 2010-11-07 LAB — URINE MICROSCOPIC-ADD ON

## 2010-11-07 LAB — CBC
MCHC: 32.4 g/dL (ref 30.0–36.0)
RDW: 13.7 % (ref 11.5–15.5)

## 2010-11-07 LAB — URINALYSIS, ROUTINE W REFLEX MICROSCOPIC
Protein, ur: NEGATIVE mg/dL
Urobilinogen, UA: 0.2 mg/dL (ref 0.0–1.0)

## 2010-11-07 LAB — LIPASE, BLOOD: Lipase: 100 U/L — ABNORMAL HIGH (ref 11–59)

## 2010-11-08 LAB — CBC
HCT: 27.2 % — ABNORMAL LOW (ref 36.0–46.0)
MCHC: 32.7 g/dL (ref 30.0–36.0)
Platelets: 163 10*3/uL (ref 150–400)
RDW: 13.6 % (ref 11.5–15.5)

## 2010-11-08 LAB — COMPREHENSIVE METABOLIC PANEL
AST: 14 U/L (ref 0–37)
Albumin: 2.7 g/dL — ABNORMAL LOW (ref 3.5–5.2)
Alkaline Phosphatase: 138 U/L — ABNORMAL HIGH (ref 39–117)
BUN: 36 mg/dL — ABNORMAL HIGH (ref 6–23)
Potassium: 4.6 mEq/L (ref 3.5–5.1)
Sodium: 143 mEq/L (ref 135–145)
Total Protein: 5.9 g/dL — ABNORMAL LOW (ref 6.0–8.3)

## 2010-11-09 LAB — CBC
HCT: 29.5 % — ABNORMAL LOW (ref 36.0–46.0)
MCV: 95.8 fL (ref 78.0–100.0)
RDW: 13.5 % (ref 11.5–15.5)
WBC: 4.1 10*3/uL (ref 4.0–10.5)

## 2010-11-09 LAB — BASIC METABOLIC PANEL
BUN: 31 mg/dL — ABNORMAL HIGH (ref 6–23)
CO2: 20 mEq/L (ref 19–32)
Chloride: 111 mEq/L (ref 96–112)
Creatinine, Ser: 1.76 mg/dL — ABNORMAL HIGH (ref 0.50–1.10)
GFR calc Af Amer: 35 mL/min — ABNORMAL LOW (ref >60)

## 2010-11-11 LAB — URINE CULTURE
Colony Count: >=100000
Culture  Setup Time: 201207040214

## 2010-11-23 NOTE — Discharge Summary (Signed)
[name], [name] NO.:  [id number]  MEDICAL RECORD NO.:  [id number]  LOCATION:  1506                         FACILITY:  [facility]  PHYSICIAN:  Ladell Pier, M.D.   DATE OF BIRTH:  1945/05/25  DATE OF ADMISSION:  11/07/2010 DATE OF DISCHARGE:  11/09/2010                              DISCHARGE SUMMARY   DISCHARGE DIAGNOSES: 1. Abdominal pain, possibly secondary to laxative use that is now     resolved with CT scan of the abdomen and pelvis negative. 2. Acute renal insufficiency that is improving. 3. Hypothyroidism. 4. Anemia. 5. Anxiety. 6. Bipolar disorder. 7. Chronic back pain. 8. Chronic constipation. 9. Gastroesophageal reflux disease. 10.Hyperlipidemia. 11.Hypertension. 12.Osteoarthritis. 13.History of renal insufficiency. 14.History of schizophrenia.  DISCHARGE MEDICATIONS:  Same as admission medications except for decrease in laxatives. 1. Docusate 100 mg twice daily. 2. Alprazolam 1 mg 3 times daily as needed. 3. Norvasc 10 mg daily. 4. Cetirizine 10 mg daily. 5. Famotidine 20 mg twice daily. 6. Flonase nasal spray 1-2 sprays per nostril daily. 7. Ibuprofen 200 mg 2 tablets every 4 hours as needed. 8. Labetalol 200 mg twice daily. 9. Lamotrigine 200 mg twice daily. 10.Lovaza 4 capsules daily. 11.Lubiprostone 24 mcg twice daily. 12.Meclizine 25 mg half a tablet to 1 tablet 4 times daily as needed. 13.Oxcarbazepine 300 mg 3 times daily. 14.Risperidone 2 mg 2 tablets twice daily. 15.Robitussin 2-3 teaspoonfuls every 4 hours as needed.  FOLLOWUP APPOINTMENTS:  The patient is being discharged back to assisted living.  Follow up with Dr. Benedetto Goad in 1 week.  PROCEDURES:  CT scan of the abdomen and pelvis showed no acute abdominal or pelvic process.  No significant change from previous x-rays.  Sigmoid and descending colon diverticulosis without diverticulitis, renal cortical calcification are unchanged.  CONSULTANTS:  None.  HISTORY  OF PRESENT ILLNESS:  The patient is a 65 year old female with a history of chronic constipation among other things.  The patient has been on and off laxative for some type of chronic constipation.  She started MiraLax and took it for the last month.  In the last couple of days, she has felt generalized abdominal cramping and swelling.  She had no nausea or vomiting.  Please see admission note for remainder of history.  Past medical history, family history, social history, medications, allergies and review of systems per admission H and P.  PHYSICAL EXAMINATION:  VITAL SIGNS:  At the time of discharge, temperature 97.4, pulse 56, respirations 18, blood pressure 153/74, pulse oximetry 95% on room air. GENERAL:  The patient is sitting up in bed, well-nourished white female. HEENT:  Normocephalic, atraumatic.  Pupils reactive to light.  Throat is without erythema.  CARDIOVASCULAR:  Regular rhythm. LUNGS:  Clear bilaterally. ABDOMEN:  Positive bowel sounds. EXTREMITIES:  Without edema.  HOSPITAL COURSE: 1. Abdominal pain:  The patient was admitted to the hospital, had a CT     scan of the abdomen and pelvis that was negative.  The patient's     symptoms improved.  Most likely abdominal cramping could be     secondary to laxative use.  If this should recur, she probably     should  need to see a gastroenterologist.  We will discontinue the     MiraLax and just keep her on docusate 100 twice a day. 2. Schizophrenia/bipolar.  We kept her on her home medications. 3. Hypertension.  She was kept on her home medication for that as     well.  Blood pressure is stable. 4. Acute-on-chronic renal insufficiency.  Creatinine at the time of     discharge is 1.76.  I am unsure what her baseline is, but that can     be followed as an outpatient. 5. Urinary tract infection and in discussing urinary tract infection:     The patient did have a urinalysis done that also showed nitrite     positive urine with  too numerous to count WBCs, so another etiology     of her abdominal pain could be acute cystitis.  We will start her     on Cipro 500 mg twice daily for 5 days and discharge her on that     medication. 6. Elevated lipase:  We will repeat her lipase prior to discharge to     make sure if it is going down or back to normal.  LABORATORY DATA:  At the time of discharge, sodium 139, potassium 4.6, chloride 111, CO2 of 20, glucose 88, BUN 31, creatinine 1.76, WBC 4.1, hemoglobin 9.5, MCV 95.8, platelets of 169.  LFTs:  Alk phos 138, AST 14, ALT 15, lipase was 100 on November 07, 2010.  We will recheck.  TIME SPENT:  Time spent with the patient and doing this discharge was approximately 35 minutes.Ladell Pier, M.D.    NJ/MEDQ  D:  11/09/2010  T:  11/09/2010  Job:  [id number]  Electronically Signed by Ladell Pier M.D. on 11/23/2010 10:59:56 PM

## 2011-01-04 ENCOUNTER — Emergency Department (HOSPITAL_COMMUNITY)
Admission: EM | Admit: 2011-01-04 | Discharge: 2011-01-05 | Disposition: A | Discharge status: Home / Self Care | Payer: Medicare Other | Attending: Emergency Medicine | Admitting: Emergency Medicine

## 2011-01-04 ENCOUNTER — Emergency Department (HOSPITAL_COMMUNITY): Payer: Medicare Other

## 2011-01-04 DIAGNOSIS — R109 Unspecified abdominal pain: Secondary | ICD-10-CM | POA: Insufficient documentation

## 2011-01-04 DIAGNOSIS — F319 Bipolar disorder, unspecified: Secondary | ICD-10-CM | POA: Insufficient documentation

## 2011-01-04 DIAGNOSIS — G8929 Other chronic pain: Secondary | ICD-10-CM | POA: Insufficient documentation

## 2011-01-04 DIAGNOSIS — K5900 Constipation, unspecified: Secondary | ICD-10-CM | POA: Insufficient documentation

## 2011-01-04 DIAGNOSIS — E039 Hypothyroidism, unspecified: Secondary | ICD-10-CM | POA: Insufficient documentation

## 2011-01-04 DIAGNOSIS — K219 Gastro-esophageal reflux disease without esophagitis: Secondary | ICD-10-CM | POA: Insufficient documentation

## 2011-01-04 DIAGNOSIS — Z79899 Other long term (current) drug therapy: Secondary | ICD-10-CM | POA: Insufficient documentation

## 2011-01-04 DIAGNOSIS — I10 Essential (primary) hypertension: Secondary | ICD-10-CM | POA: Insufficient documentation

## 2011-01-04 LAB — URINALYSIS, ROUTINE W REFLEX MICROSCOPIC
Glucose, UA: NEGATIVE mg/dL
Hgb urine dipstick: NEGATIVE
Ketones, ur: NEGATIVE mg/dL
Leukocytes, UA: NEGATIVE
pH: 6 (ref 5.0–8.0)

## 2011-01-29 LAB — URINALYSIS, ROUTINE W REFLEX MICROSCOPIC
Bilirubin Urine: NEGATIVE
Hgb urine dipstick: NEGATIVE
Ketones, ur: NEGATIVE
Protein, ur: NEGATIVE
Specific Gravity, Urine: 1.007
Urobilinogen, UA: 0.2

## 2011-01-29 LAB — CBC
HCT: 30 — ABNORMAL LOW
Hemoglobin: 10.4 — ABNORMAL LOW
MCV: 88
RDW: 16.1 — ABNORMAL HIGH
WBC: 5.6

## 2011-01-29 LAB — URINE MICROSCOPIC-ADD ON

## 2011-01-29 LAB — DIFFERENTIAL
Basophils Absolute: 0
Basophils Relative: 0
Neutro Abs: 3.8
Neutrophils Relative %: 69

## 2011-01-29 LAB — URINE CULTURE: Colony Count: >=100000

## 2011-01-29 LAB — COMPREHENSIVE METABOLIC PANEL
Alkaline Phosphatase: 159 — ABNORMAL HIGH
BUN: 23
Chloride: 107
Creatinine, Ser: 1.32 — ABNORMAL HIGH
GFR calc non Af Amer: 41 — ABNORMAL LOW
Glucose, Bld: 111 — ABNORMAL HIGH
Potassium: 5
Total Bilirubin: 0.5

## 2011-01-29 LAB — POCT CARDIAC MARKERS
CKMB, poc: 2.8
Myoglobin, poc: 153
Operator id: 1211

## 2011-02-09 LAB — LIPID PANEL
Cholesterol: 270 — ABNORMAL HIGH
HDL: 38 — ABNORMAL LOW
LDL Cholesterol: UNDETERMINED
Total CHOL/HDL Ratio: 7.1
Triglycerides: 408 — ABNORMAL HIGH

## 2011-02-09 LAB — BASIC METABOLIC PANEL
CO2: 22
Calcium: 9.5
Creatinine, Ser: 1.21 — ABNORMAL HIGH
GFR calc Af Amer: 55 — ABNORMAL LOW

## 2011-02-09 LAB — CBC
Hemoglobin: 10.8 — ABNORMAL LOW
MCHC: 33.8
MCV: 88.1
RBC: 3.62 — ABNORMAL LOW

## 2011-02-09 LAB — CARDIAC PANEL(CRET KIN+CKTOT+MB+TROPI): Total CK: 102

## 2011-02-09 LAB — C-REACTIVE PROTEIN: CRP: 1 — ABNORMAL HIGH (ref <0.6)

## 2011-02-12 LAB — CBC
HCT: 31 — ABNORMAL LOW
Hemoglobin: 10.4 — ABNORMAL LOW
MCV: 88
RBC: 3.53 — ABNORMAL LOW
WBC: 5.3

## 2011-02-12 LAB — DIFFERENTIAL
Eosinophils Absolute: 0.2
Eosinophils Relative: 3
Lymphs Abs: 1.2
Monocytes Relative: 7

## 2011-02-12 LAB — HEPATIC FUNCTION PANEL
AST: 17
Albumin: 3.9
Alkaline Phosphatase: 147 — ABNORMAL HIGH
Bilirubin, Direct: 0.2
Total Bilirubin: 0.6

## 2011-02-12 LAB — I-STAT 8, (EC8 V) (CONVERTED LAB)
Acid-base deficit: 7 — ABNORMAL HIGH
Glucose, Bld: 97
TCO2: 22
pCO2, Ven: 47.7
pH, Ven: 7.242 — ABNORMAL LOW

## 2011-02-12 LAB — URINALYSIS, ROUTINE W REFLEX MICROSCOPIC
Bilirubin Urine: NEGATIVE
Glucose, UA: NEGATIVE
Ketones, ur: NEGATIVE
Specific Gravity, Urine: 1.01
pH: 6

## 2011-02-12 LAB — URINE MICROSCOPIC-ADD ON

## 2011-02-12 LAB — URINE CULTURE

## 2011-02-12 LAB — POCT CARDIAC MARKERS
CKMB, poc: 2.7
Myoglobin, poc: 176
Operator id: 151321

## 2011-02-12 LAB — CK TOTAL AND CKMB (NOT AT ARMC)
Relative Index: INVALID
Total CK: 87

## 2011-02-17 NOTE — H&P (Signed)
[name], Mcmahon NO.:  [id number]  MEDICAL RECORD NO.:  [id number]  LOCATION:  1506                         FACILITY:  [facility]  PHYSICIAN:  Lonia Blood, M.D.      DATE OF BIRTH:  February 11, 1946  DATE OF ADMISSION:  11/07/2010 DATE OF DISCHARGE:                             HISTORY & PHYSICAL   PRIMARY CARE PHYSICIAN:  Gloriajean Dell. Andrey Campanile, M.D.  PRESENTING COMPLAINT:  Abdominal pain.  HISTORY OF PRESENT ILLNESS:  The patient is a 65 year old female with history of chronic constipation among other things who has apparently been on chronic laxatives on and off for long time.  She recently had MiraLax added as her laxative and took it for the last 1 month.  In the last couple of days, she has felt generalized abdominal pain and swelling.  Since she started the MiraLax, no nausea or vomiting.  She had mild diarrhea yesterday, but the abdomen has continued to swell up and tender.  She denied any urinary problems associated with this.  She denied any fever or chills, but she is still nauseated and feels very much uncomfortable.  She lives in the [facility].  She was brought to the emergency room with worrying about possibility of small-bowel obstruction.  PAST MEDICAL HISTORY:  Extensive including: 1. Anemia. 2. Anxiety. 3. Bipolar disorder. 4. Chronic back pain. 5. Constipation. 6. GERD. 7. Hyperlipidemia. 8. Hypertension. 9. Hypothyroidism. 10.Osteoarthritis. 11.History of renal insufficiency. 12.History of schizophrenia.  ALLERGIES:  She has no known drug allergies.  CURRENT MEDICATIONS:  Include: 1. Alprazolam 1 mg t.i.d. 2. Citrucel fiber laxative 2 tablespoons twice a day. 3. Risperdal 4 mg twice a day. 4. Amitiza 24 mcg twice a day. 5. Amlodipine 10 mg daily. 6. Sodium docusate 100 mg daily. 7. Famotidine 20 mg twice a day. 8. Labetalol 200 mg twice a day. 9. Lamotrigine 200 mg twice a day. 10.Oxcarbazepine 300 mg  t.i.d. 11.Cetirizine 10 mg daily. 12.Fluticasone 50 mcg daily. 13.Lovaza 4 g daily. 14.MiraLax 17 g daily. 15.Ibuprofen 400 mg p.r.n. 16.Aspirin 1000 mg p.r.n. 17.Fleet's enema as needed.  SOCIAL HISTORY:  The patient lives in [place].  Denied tobacco, alcohol, or IV drug use.  She lives at the [facility]. No tobacco, alcohol, or IV drug use.  FAMILY HISTORY:  Significant only for stroke, hypothyroidism, depression, and normal pressure hydrocephalus.  REVIEW OF SYSTEMS:  All systems reviewed are negative except per HPI.  PHYSICAL EXAMINATION:  VITAL SIGNS:  Temperature 98, blood pressure 140/78, pulse 60, respiratory rate 16, her sats 94% on room air. GENERAL:  She is awake, alert woman, slow but in no acute distress. HEENT:  PERRL.  EOMI.  No pallor, no jaundice, no rhinorrhea. NECK:  Supple.  No visible JVD, no lymphadenopathy. RESPIRATORY:  She has good air entry bilaterally.  No significant wheeze.  No rales.  No crackles. CARDIOVASCULAR SYSTEM:  She has S1 and S2.  No audible murmur.  No rub. ABDOMEN:  Distended, tympanitic, soft with exaggerated bowel sounds. She has diffuse discomfort, but nontender.  No organomegaly. EXTREMITIES:  No significant edema, cyanosis, or clubbing. SKIN:  No rashes.  No ulcers. MUSCULOSKELETAL:  No  joint swelling or tenderness.  LABORATORY DATA:  White count is 5.7, hemoglobin 9.7 with platelet count of 188 and MCV of 94 and normal differentials.  Urinalysis showed cloudy urine with some trace hemoglobin, positive nitrite, large leukocyte esterase, wbc's too numerous to count, rbc's 0-2, many bacteria.  Sodium is 137, potassium 5.1, chloride 105, CO2 of 23, glucose 81, BUN 42, creatinine 2.16 with a calcium of 9.9, total protein 7.1, albumin 3.3, lipase is 100.  Acute abdominal series showed no acute cardiopulmonary process.  No evidence of bowel obstruction.  CT abdomen and pelvis showed no acute abdominal or pelvic  process.  No significant change from prior.  The sigmoid, descending colon diverticulosis without diverticulitis, renal cortical calcification unchanged from prior.  ASSESSMENT:  This is a 65 year old female presenting with mild abdominal distention, probably from the use of MiraLax, which can give occasional megacolon even though that is not seen on the CT.  She also has a UTI. Otherwise stable.  PLAN: 1. Abdominal pain and distention.  This will be treated     symptomatically.  The patient should probably stop taking MiraLax     at this point.  Watch her diet with increasing fiber.  We will     advance her diet closely in the hospital and make sure she is     taking okay. 2. UTI.  I will give her empiric ceftriaxone while awaiting for urine     culture. 3. GERD.  Continue with PPI. 4. Hypertension.  Blood pressure seems reasonable.  I will continue     with home medication. 5. Hyperlipidemia.  Also, continue with her statin. 6. Hypothyroidism.  Check TSH and continue with Synthroid. 7. Schizophrenia and other psych disease.  Again, we will continue     with her home medications. 8. Renal insufficiency.  The patient denied any chronic kidney     disease, however, as such her prior labs showed that she did have     chronic kidney disease from previous hospitalizations with her     creatinine at some point start to be acute-on-chronic and as high     as 4.1 back in 2011, so this may be her baseline. 9. Osteoarthritis.  Again, continue with home medications. 10.Chronic constipation.  At this point, the patient has overused     laxatives.  We will not give her any laxatives for now.     Lonia Blood, M.D.     Verlin Grills  D:  11/08/2010  T:  11/08/2010  Job:  [id number]  Electronically Signed by Lonia Blood M.D. on 02/17/2011 02:39:15 PM

## 2011-02-23 ENCOUNTER — Emergency Department (HOSPITAL_COMMUNITY)
Admission: EM | Admit: 2011-02-23 | Discharge: 2011-02-23 | Disposition: A | Discharge status: Home / Self Care | Payer: Medicare Other | Attending: Emergency Medicine | Admitting: Emergency Medicine

## 2011-02-23 ENCOUNTER — Emergency Department (HOSPITAL_COMMUNITY): Payer: Medicare Other

## 2011-02-23 DIAGNOSIS — I10 Essential (primary) hypertension: Secondary | ICD-10-CM | POA: Insufficient documentation

## 2011-02-23 DIAGNOSIS — R109 Unspecified abdominal pain: Secondary | ICD-10-CM | POA: Insufficient documentation

## 2011-02-23 DIAGNOSIS — R059 Cough, unspecified: Secondary | ICD-10-CM | POA: Insufficient documentation

## 2011-02-23 DIAGNOSIS — N289 Disorder of kidney and ureter, unspecified: Secondary | ICD-10-CM | POA: Insufficient documentation

## 2011-02-23 DIAGNOSIS — R05 Cough: Secondary | ICD-10-CM | POA: Insufficient documentation

## 2011-02-23 DIAGNOSIS — E039 Hypothyroidism, unspecified: Secondary | ICD-10-CM | POA: Insufficient documentation

## 2011-02-23 LAB — POCT I-STAT, CHEM 8
BUN: 44 mg/dL — ABNORMAL HIGH (ref 6–23)
Calcium, Ion: 1.29 mmol/L (ref 1.12–1.32)
Chloride: 110 mEq/L (ref 96–112)
Glucose, Bld: 97 mg/dL (ref 70–99)
TCO2: 23 mmol/L (ref 0–100)

## 2011-02-23 LAB — OCCULT BLOOD, POC DEVICE: Fecal Occult Bld: NEGATIVE

## 2011-03-16 ENCOUNTER — Other Ambulatory Visit: Payer: Self-pay

## 2011-04-12 ENCOUNTER — Emergency Department (HOSPITAL_COMMUNITY): Payer: Medicare Other

## 2011-04-12 ENCOUNTER — Emergency Department (HOSPITAL_COMMUNITY)
Admission: EM | Admit: 2011-04-12 | Discharge: 2011-04-13 | Disposition: A | Discharge status: Home / Self Care | Payer: Medicare Other | Attending: Emergency Medicine | Admitting: Emergency Medicine

## 2011-04-12 DIAGNOSIS — I10 Essential (primary) hypertension: Secondary | ICD-10-CM | POA: Insufficient documentation

## 2011-04-12 DIAGNOSIS — M4000 Postural kyphosis, site unspecified: Secondary | ICD-10-CM | POA: Insufficient documentation

## 2011-04-12 DIAGNOSIS — Z79899 Other long term (current) drug therapy: Secondary | ICD-10-CM | POA: Insufficient documentation

## 2011-04-12 DIAGNOSIS — K5900 Constipation, unspecified: Secondary | ICD-10-CM | POA: Insufficient documentation

## 2011-04-12 DIAGNOSIS — R109 Unspecified abdominal pain: Secondary | ICD-10-CM | POA: Insufficient documentation

## 2011-04-12 HISTORY — DX: Anxiety disorder, unspecified: F41.9

## 2011-04-12 HISTORY — DX: Essential (primary) hypertension: I10

## 2011-04-12 HISTORY — DX: Dorsalgia, unspecified: M54.9

## 2011-04-12 HISTORY — DX: Bipolar disorder, unspecified: F31.9

## 2011-04-12 HISTORY — DX: Gastro-esophageal reflux disease without esophagitis: K21.9

## 2011-04-12 HISTORY — DX: Unspecified osteoarthritis, unspecified site: M19.90

## 2011-04-12 LAB — URINALYSIS, ROUTINE W REFLEX MICROSCOPIC
Bilirubin Urine: NEGATIVE
Glucose, UA: NEGATIVE mg/dL
Hgb urine dipstick: NEGATIVE
Ketones, ur: NEGATIVE mg/dL
Nitrite: POSITIVE — AB
Protein, ur: NEGATIVE mg/dL
Specific Gravity, Urine: 1.009 (ref 1.005–1.030)
Urobilinogen, UA: 0.2 mg/dL (ref 0.0–1.0)
pH: 6 (ref 5.0–8.0)

## 2011-04-12 LAB — COMPREHENSIVE METABOLIC PANEL
ALT: 15 U/L (ref 0–35)
AST: 14 U/L (ref 0–37)
Albumin: 3 g/dL — ABNORMAL LOW (ref 3.5–5.2)
Alkaline Phosphatase: 181 U/L — ABNORMAL HIGH (ref 39–117)
BUN: 39 mg/dL — ABNORMAL HIGH (ref 6–23)
CO2: 24 mEq/L (ref 19–32)
Calcium: 10.3 mg/dL (ref 8.4–10.5)
Chloride: 104 mEq/L (ref 96–112)
Creatinine, Ser: 2.21 mg/dL — ABNORMAL HIGH (ref 0.50–1.10)
GFR calc Af Amer: 26 mL/min — ABNORMAL LOW (ref >90)
GFR calc non Af Amer: 22 mL/min — ABNORMAL LOW (ref >90)
Glucose, Bld: 90 mg/dL (ref 70–99)
Potassium: 4.6 mEq/L (ref 3.5–5.1)
Sodium: 136 mEq/L (ref 135–145)
Total Bilirubin: 0.1 mg/dL — ABNORMAL LOW (ref 0.3–1.2)
Total Protein: 7 g/dL (ref 6.0–8.3)

## 2011-04-12 LAB — CBC
HCT: 27.9 % — ABNORMAL LOW (ref 36.0–46.0)
Hemoglobin: 9.3 g/dL — ABNORMAL LOW (ref 12.0–15.0)
MCH: 30.9 pg (ref 26.0–34.0)
MCHC: 33.3 g/dL (ref 30.0–36.0)
MCV: 92.7 fL (ref 78.0–100.0)
Platelets: 235 10*3/uL (ref 150–400)
RBC: 3.01 MIL/uL — ABNORMAL LOW (ref 3.87–5.11)
RDW: 14 % (ref 11.5–15.5)
WBC: 5.8 10*3/uL (ref 4.0–10.5)

## 2011-04-12 LAB — URINE MICROSCOPIC-ADD ON

## 2011-04-12 MED ORDER — SODIUM CHLORIDE 0.9 % IV BOLUS (SEPSIS)
1000.0000 mL | Freq: Once | INTRAVENOUS | Status: AC
  Administered 2011-04-12: 1000 mL via INTRAVENOUS

## 2011-04-12 MED ORDER — CEFTRIAXONE SODIUM 1 G IJ SOLR
1.0000 g | Freq: Once | INTRAMUSCULAR | Status: AC
  Administered 2011-04-12: 1 g via INTRAVENOUS
  Filled 2011-04-12: qty 10

## 2011-04-12 NOTE — ED Notes (Signed)
Pt brought by ems from SNF with c/o constipation, pt c/o abd pain and tenderness as well.pt states she normally has BM daily and her last BM was on thanksgiving day

## 2011-04-12 NOTE — ED Notes (Signed)
JXB:[id number]<GN> Expected date:04/12/11<BR> Expected time: 5:18 PM<BR> Means of arrival:Ambulance<BR> Comments:<BR> EMS 31 GC - constipation

## 2011-04-13 NOTE — ED Provider Notes (Signed)
History    65yf with constipation. Says has not had normal BM since about thanksgiving. Doesn't feel like has to go but concerned that hasn't seemed to. Takes PRN meds for constipation. Has been giving self enemas recently as well and says just getting "water." Mild crampy intermittent abdominal pain. NO feve ror chills. No n/v. No back pain. No urinary complaints. No fever or chills. No change in appetite. No unintentional weight loss. No melena or brbpr.  CSN: [id number] Arrival date & time: 04/12/2011  5:46 PM   First MD Initiated Contact with Patient 04/12/11 1821      Chief Complaint  Patient presents with  . Constipation    (Consider location/radiation/quality/duration/timing/severity/associated sxs/prior treatment) HPI  Past Medical History  Diagnosis Date  . Constipation   . Bipolar 1 disorder   . Hypertension   . Esophageal reflux   . Anxiety   . Back pain   . Osteoarthritis     Past Surgical History  Procedure Date  . Cholecystectomy   . Appendectomy   . Tonsillectomy     History reviewed. No pertinent family history.  History  Substance Use Topics  . Smoking status: Not on file  . Smokeless tobacco: Not on file  . Alcohol Use:     OB History    Grav Para Term Preterm Abortions TAB SAB Ect Mult Living                  Review of Systems   Review of symptoms negative unless otherwise noted in HPI.   Allergies  Review of patient's allergies indicates no known allergies.  Home Medications   Current Outpatient Rx  Name Route Sig Dispense Refill  . ALPRAZOLAM 1 MG PO TABS Oral Take 1 mg by mouth 3 (three) times daily as needed.     Marland Kitchen AMLODIPINE BESYLATE 10 MG PO TABS Oral Take 10 mg by mouth daily.      Marland Kitchen CETIRIZINE HCL 10 MG PO TABS Oral Take 10 mg by mouth daily.      Marland Kitchen DOCUSATE SODIUM 100 MG PO CAPS Oral Take 300 mg by mouth 2 (two) times daily.      Marland Kitchen FAMOTIDINE 20 MG PO TABS Oral Take 20 mg by mouth 2 (two) times daily.      . GUAIFENESIN  100 MG/5ML PO LIQD Oral Take 200 mg by mouth 3 (three) times daily as needed. Take 2 to 3 teaspoons by mouth every four hours as needed for cough     . IBUPROFEN 200 MG PO TABS Oral Take 200 mg by mouth every 6 (six) hours as needed.      Marland Kitchen LABETALOL HCL 200 MG PO TABS Oral Take 200 mg by mouth 2 (two) times daily.      Marland Kitchen LAMOTRIGINE 200 MG PO TABS Oral Take 200 mg by mouth 2 (two) times daily.     . LUBIPROSTONE 24 MCG PO CAPS Oral Take 24 mcg by mouth 2 (two) times daily with a meal.      . NITROFURANTOIN MACROCRYSTAL 50 MG PO CAPS Oral Take 50 mg by mouth at bedtime.      . OMEGA-3-ACID ETHYL ESTERS 1 G PO CAPS Oral Take 1 g by mouth 2 (two) times daily.      Marland Kitchen OXCARBAZEPINE 300 MG PO TABS Oral Take 300 mg by mouth 2 (two) times daily. Take two tablets by mouth each morning and three at bedtime    . PSYLLIUM 95 % PO PACK  Oral Take 1 packet by mouth daily.      Marland Kitchen RISPERIDONE 2 MG PO TABS Oral Take 2 mg by mouth 2 (two) times daily.        BP 166/75  Pulse 63  Temp(Src) 98.2 F (36.8 C) (Oral)  Resp 20  Ht 5\' 6"  (1.676 m)  Wt 180 lb (81.647 kg)  BMI 29.05 kg/m2  SpO2 99%  Physical Exam  Nursing note and vitals reviewed. Constitutional: She appears well-developed and well-nourished. No distress.  HENT:  Head: Normocephalic and atraumatic.  Eyes: Conjunctivae are normal. Right eye exhibits no discharge. Left eye exhibits no discharge.  Cardiovascular: Normal rate, regular rhythm and normal heart sounds.  Exam reveals no gallop and no friction rub.   No murmur heard. Pulmonary/Chest: Effort normal and breath sounds normal. No respiratory distress.  Abdominal: Soft. She exhibits no distension and no mass. There is no tenderness. There is no rebound.  Genitourinary:       No external rectal lesions. No internal masses felt. No tenderness. No stool in rectal vault. No melena or blood.  Musculoskeletal: She exhibits no edema and no tenderness.       kyphotic  Neurological: She is alert.   Skin: Skin is warm and dry.  Psychiatric: She has a normal mood and affect. Her behavior is normal. Thought content normal.    ED Course  Procedures (including critical care time)  Labs Reviewed  CBC - Abnormal; Notable for the following:    RBC 3.01 (*)    Hemoglobin 9.3 (*)    HCT 27.9 (*)    All other components within normal limits  COMPREHENSIVE METABOLIC PANEL - Abnormal; Notable for the following:    BUN 39 (*)    Creatinine, Ser 2.21 (*)    Albumin 3.0 (*)    Alkaline Phosphatase 181 (*)    Total Bilirubin 0.1 (*)    GFR calc non Af Amer 22 (*)    GFR calc Af Amer 26 (*)    All other components within normal limits  URINALYSIS, ROUTINE W REFLEX MICROSCOPIC - Abnormal; Notable for the following:    APPearance CLOUDY (*)    Nitrite POSITIVE (*)    Leukocytes, UA LARGE (*)    All other components within normal limits  URINE MICROSCOPIC-ADD ON - Abnormal; Notable for the following:    Bacteria, UA MANY (*)    All other components within normal limits   Ct Abdomen Pelvis Wo Contrast  04/12/2011  *RADIOLOGY REPORT*  Clinical Data: Left abdominal pain and tenderness.  Constipation.  CT ABDOMEN AND PELVIS WITHOUT CONTRAST  Technique:  Multidetector CT imaging of the abdomen and pelvis was performed following the standard protocol without intravenous contrast.  Comparison: Acute abdominal series 02/23/2011.  Abdominal pelvic CT 11/07/2010.  Findings: The lung bases are clear.  There is no pleural effusion. As imaged in the noncontrast state, the liver, spleen, pancreas and right adrenal gland appear normal.  There is stable mild fullness of the left adrenal gland.  There is no significant biliary dilatation status post cholecystectomy.  Extensive parenchymal calcifications are again noted throughout both kidneys.  Small low-density lesions appear stable.  There is no hydronephrosis or increased perinephric soft tissue stranding. There is no evidence of ureteral calculus.  Enteric  contrast enters the right colon.  There is primarily liquid stool throughout the remainder of the colon.  Scattered diverticular changes are present.  There is no appreciable bowel wall thickening or surrounding inflammatory change.  The uterus, ovaries and urinary bladder appear unremarkable.  There are no acute osseous findings.  IMPRESSION:  1.  No acute abdominal pelvic findings identified. 2.  Liquid stool throughout the colon.  No evidence of bowel obstruction or inflammation. 3.  Chronic renal disease with numerous cortical calcifications. No hydronephrosis.  Original Report Authenticated By: Gerrianne Scale, M.D.     1. Abdominal  pain, other specified site   2. Constipation       MDM  65yF with mild abdominal pain and constipation. Abdominal exam benign but scanned because of age. No impaction or concerning findings on DRE. W/u relatively unremarkable. Pt already taking PRN meds. Fu as outpt.       Raeford Razor, MD 04/13/11 Moses Manners

## 2011-04-23 ENCOUNTER — Emergency Department (HOSPITAL_COMMUNITY): Payer: Medicare Other

## 2011-04-23 ENCOUNTER — Emergency Department (HOSPITAL_COMMUNITY)
Admission: EM | Admit: 2011-04-23 | Discharge: 2011-04-23 | Disposition: A | Discharge status: Home / Self Care | Payer: Medicare Other | Attending: Emergency Medicine | Admitting: Emergency Medicine

## 2011-04-23 ENCOUNTER — Encounter (HOSPITAL_COMMUNITY): Payer: Self-pay | Admitting: *Deleted

## 2011-04-23 DIAGNOSIS — F319 Bipolar disorder, unspecified: Secondary | ICD-10-CM | POA: Insufficient documentation

## 2011-04-23 DIAGNOSIS — K219 Gastro-esophageal reflux disease without esophagitis: Secondary | ICD-10-CM | POA: Insufficient documentation

## 2011-04-23 DIAGNOSIS — R109 Unspecified abdominal pain: Secondary | ICD-10-CM | POA: Insufficient documentation

## 2011-04-23 DIAGNOSIS — I10 Essential (primary) hypertension: Secondary | ICD-10-CM | POA: Insufficient documentation

## 2011-04-23 DIAGNOSIS — N390 Urinary tract infection, site not specified: Secondary | ICD-10-CM | POA: Insufficient documentation

## 2011-04-23 DIAGNOSIS — K5900 Constipation, unspecified: Secondary | ICD-10-CM | POA: Insufficient documentation

## 2011-04-23 LAB — DIFFERENTIAL
Basophils Absolute: 0 10*3/uL (ref 0.0–0.1)
Basophils Relative: 1 % (ref 0–1)
Eosinophils Relative: 1 % (ref 0–5)
Monocytes Absolute: 0.3 10*3/uL (ref 0.1–1.0)
Neutro Abs: 4 10*3/uL (ref 1.7–7.7)

## 2011-04-23 LAB — POCT I-STAT, CHEM 8
Calcium, Ion: 1.32 mmol/L (ref 1.12–1.32)
Glucose, Bld: 97 mg/dL (ref 70–99)
HCT: 27 % — ABNORMAL LOW (ref 36.0–46.0)
Hemoglobin: 9.2 g/dL — ABNORMAL LOW (ref 12.0–15.0)
TCO2: 20 mmol/L (ref 0–100)

## 2011-04-23 LAB — CBC
HCT: 27.8 % — ABNORMAL LOW (ref 36.0–46.0)
MCHC: 31.7 g/dL (ref 30.0–36.0)
Platelets: 333 10*3/uL (ref 150–400)
RDW: 13.7 % (ref 11.5–15.5)

## 2011-04-23 LAB — URINALYSIS, ROUTINE W REFLEX MICROSCOPIC
Bilirubin Urine: NEGATIVE
Ketones, ur: NEGATIVE mg/dL
Nitrite: POSITIVE — AB
Urobilinogen, UA: 0.2 mg/dL (ref 0.0–1.0)

## 2011-04-23 MED ORDER — MAGNESIUM CITRATE PO SOLN
1.0000 | Freq: Once | ORAL | Status: AC
  Administered 2011-04-23: 1 via ORAL
  Filled 2011-04-23: qty 296

## 2011-04-23 MED ORDER — NITROFURANTOIN MONOHYD MACRO 100 MG PO CAPS: 100.0000 mg | ORAL_CAPSULE | Freq: Two times a day (BID) | ORAL | Status: AC

## 2011-04-23 MED ORDER — FLEET ENEMA 7-19 GM/118ML RE ENEM
1.0000 | ENEMA | Freq: Once | RECTAL | Status: AC
  Administered 2011-04-23: 1 via RECTAL
  Filled 2011-04-23: qty 1

## 2011-04-23 NOTE — ED Notes (Signed)
Pt state she feels the urge to go but unable to have a bowel movement. Pt abdomin is distended and pt c/o abdominal pain

## 2011-04-23 NOTE — ED Provider Notes (Signed)
History     CSN: [id number] Arrival date & time: 04/23/2011 10:16 AM   First MD Initiated Contact with Patient 04/23/11 1126      Chief Complaint  Patient presents with  . Constipation  . Abdominal Pain    (Consider location/radiation/quality/duration/timing/severity/associated sxs/prior treatment) Patient is a 65 y.o. female presenting with constipation. The history is provided by the patient.  Constipation  Associated symptoms include abdominal pain. Pertinent negatives include no fever, no diarrhea, no nausea, no rectal pain, no vomiting, no chest pain and no rash.  Hx limited as pt is poor historian. States she is unable to recall when her last BM was. She has had trouble with constipation in the past and states she is on Amitiza daily, Colace, fiber supplement for this. Intermittently gives herself enemas and states she has just noted "water" with these. Abd feels distended to her, with generalized abd discomfort. She is able to pass gas. Has not had any changes in appetite or nausea/vomiting. Denies noting blood in stool/rectal bleeding, fever/chills, wt change.  Review of the chart indicates that pt was seen for the same on 12/6. She was evaled at that time with CT abd/pelvis which did not show any acute abd findings. She was instructed to f/u as outpt.  She apparently saw her PCP and was given GI referral to Dr. Loreta Ave last week; she cancelled this appt for an unknown reason.  Past Medical History  Diagnosis Date  . Constipation   . Bipolar 1 disorder   . Hypertension   . Esophageal reflux   . Anxiety   . Back pain   . Osteoarthritis     Past Surgical History  Procedure Date  . Cholecystectomy   . Appendectomy   . Tonsillectomy     History reviewed. No pertinent family history.  History  Substance Use Topics  . Smoking status: Never Smoker   . Smokeless tobacco: Not on file  . Alcohol Use: No    OB History    Grav Para Term Preterm Abortions TAB SAB Ect Mult  Living                  Review of Systems  Constitutional: Negative for fever, chills, activity change, appetite change and unexpected weight change.  Respiratory: Negative for chest tightness and shortness of breath.   Cardiovascular: Negative for chest pain and palpitations.  Gastrointestinal: Positive for abdominal pain, constipation and abdominal distention. Negative for nausea, vomiting, diarrhea, blood in stool, anal bleeding and rectal pain.  Genitourinary: Negative for dysuria and pelvic pain.  Musculoskeletal: Negative for myalgias.  Skin: Negative for rash.    Allergies  Review of patient's allergies indicates no known allergies.  Home Medications   Current Outpatient Rx  Name Route Sig Dispense Refill  . ALPRAZOLAM 1 MG PO TABS Oral Take 1 mg by mouth 3 (three) times daily.     Marland Kitchen ALPRAZOLAM 1 MG PO TABS Oral Take 1 mg by mouth at bedtime as needed. sleep     . AMLODIPINE BESYLATE 10 MG PO TABS Oral Take 10 mg by mouth daily.      Marland Kitchen CETIRIZINE HCL 10 MG PO TABS Oral Take 10 mg by mouth daily.      . CYCLOBENZAPRINE HCL 5 MG PO TABS Oral Take 5 mg by mouth 3 (three) times daily as needed. pain     . DOCUSATE SODIUM 100 MG PO CAPS Oral Take 300 mg by mouth 2 (two) times daily.      Marland Kitchen  FAMOTIDINE 20 MG PO TABS Oral Take 20 mg by mouth 2 (two) times daily.      . GUAIFENESIN 100 MG/5ML PO LIQD Oral Take 200 mg by mouth 3 (three) times daily as needed. Take 2 to 3 teaspoons by mouth every four hours as needed for cough     . IBUPROFEN 200 MG PO TABS Oral Take 200 mg by mouth every 6 (six) hours as needed. pain    . LABETALOL HCL 200 MG PO TABS Oral Take 200 mg by mouth 2 (two) times daily.      Marland Kitchen LAMOTRIGINE 200 MG PO TABS Oral Take 200 mg by mouth 2 (two) times daily.     Marland Kitchen LEVOTHYROXINE SODIUM 50 MCG PO TABS Oral Take 50 mcg by mouth daily.      . LUBIPROSTONE 24 MCG PO CAPS Oral Take 24 mcg by mouth 2 (two) times daily with a meal.     . MECLIZINE HCL 25 MG PO TABS Oral Take  12.5-25 mg by mouth 4 (four) times daily as needed. dizziness     . NITROFURANTOIN MACROCRYSTAL 50 MG PO CAPS Oral Take 50 mg by mouth at bedtime. Pt takes daily    . OMEGA-3-ACID ETHYL ESTERS 1 G PO CAPS Oral Take 4 g by mouth 2 (two) times daily. Pt takes 4 capsules daily    . OXCARBAZEPINE 300 MG PO TABS Oral Take 600-900 mg by mouth 2 (two) times daily. Take two tablets by mouth each morning and three at bedtime    . RISPERIDONE 2 MG PO TABS Oral Take 4 mg by mouth 2 (two) times daily. Pt takes 2 tabs for a 4mg  dose. Twice daily    . SIMETHICONE 80 MG PO CHEW Oral Chew 80 mg by mouth 2 (two) times daily as needed. gas     . ACETAMINOPHEN 500 MG PO TABS Oral Take 500 mg by mouth every 4 (four) hours as needed. pain     . FLUTICASONE PROPIONATE 50 MCG/ACT NA SUSP Nasal Place 1-2 sprays into the nose daily.      . GUAIFENESIN-DM 100-10 MG/5ML PO SYRP Oral Take 15 mLs by mouth 3 (three) times daily as needed. cough     . PSYLLIUM 95 % PO PACK Oral Take 1 packet by mouth daily.        BP 124/63  Pulse 77  Temp(Src) 98.9 F (37.2 C) (Oral)  Resp 16  SpO2 94%  Physical Exam  Nursing note and vitals reviewed. Constitutional: She appears well-developed and well-nourished. No distress.  HENT:  Head: Normocephalic and atraumatic.  Eyes: Conjunctivae are normal. Pupils are equal, round, and reactive to light.  Neck: Normal range of motion.  Cardiovascular: Normal rate, regular rhythm and normal heart sounds.   Pulmonary/Chest: Effort normal and breath sounds normal. No respiratory distress.  Abdominal: Soft. Bowel sounds are normal. She exhibits no mass. There is no tenderness. There is no rebound and no guarding.       Questionable slight generalized abd distension. Generalized mild TTP. Abd obese.  Genitourinary:       No ext lesions. Small internal hemorrhoids. No stool in rectal vault or tenderness with exam. Stool nl color.  Musculoskeletal: Normal range of motion.  Neurological: She is  alert.  Skin: Skin is dry. She is not diaphoretic.  Psychiatric: She has a normal mood and affect.    ED Course  Procedures (including critical care time)  Labs Reviewed  CBC - Abnormal; Notable for the  following:    RBC 2.91 (*)    Hemoglobin 8.8 (*)    HCT 27.8 (*)    All other components within normal limits  URINALYSIS, ROUTINE W REFLEX MICROSCOPIC - Abnormal; Notable for the following:    APPearance CLOUDY (*)    Hgb urine dipstick TRACE (*)    Nitrite POSITIVE (*)    Leukocytes, UA LARGE (*)    All other components within normal limits  POCT I-STAT, CHEM 8 - Abnormal; Notable for the following:    Chloride 117 (*)    BUN 33 (*)    Creatinine, Ser 2.50 (*)    Hemoglobin 9.2 (*)    HCT 27.0 (*)    All other components within normal limits  URINE MICROSCOPIC-ADD ON - Abnormal; Notable for the following:    Bacteria, UA FEW (*)    All other components within normal limits  DIFFERENTIAL  URINE CULTURE   Dg Abd Acute W/chest  04/23/2011  *RADIOLOGY REPORT*  Clinical Data: Abdominal pain, distension and constipation, evaluate for small bowel obstruction  ACUTE ABDOMEN SERIES (ABDOMEN 2 VIEW & CHEST 1 VIEW)  Comparison: 02/23/2011; chest radiograph - 11/25/2009; abdominal CT - 04/12/2011  Findings:  Paucity of bowel gas without evidence of obstruction.  Post cholecystectomy.  No pneumoperitoneum, pneumatosis or portal venous gas.  Grossly unchanged cardiac silhouette and mediastinal contours with atherosclerotic calcifications within the aortic arch.  There is minimal blunting of bilateral costophrenic angles without definite pleural effusion.  No focal airspace opacities.  No definite pneumothorax, though evaluation somewhat limited secondary to overlying chin. No acute osseous abnormalities.  IMPRESSION: 1.  Paucity of bowel gas without definite evidence of obstruction. 2.  No acute cardiopulmonary disease.  Original Report Authenticated By: Waynard Reeds, M.D.     1. Urinary  tract infection       MDM  1:09 PM Rectal exam performed. Internal hemorrhoids on exam. H/H stable. Abd plain films without definitive evidence for SBO, reviewed by myself. Pt just had CT performed last week, which was negative for acute findings, so I do not think that repeating this would add much to the clinical picture at this point. Will have nursing staff give enema and reassess.  3:00 PM Enema did not produce large amount of stool. Pt given mag citrate. Moved to TCU.  6:00 PM Pt has not had BM with mag citrate. Her abd is less tender than prior. I asked her further and she states that she has been "passing liquid" for the past several weeks (liquid stool) but has not had a firm BM. Was able to "pass liquid" with the enema. Plain film again reviewed by myself; there is not great evidence of large stool burden. Per RN, pt's sister visited her briefly and commented to RN that pt has had intermittent px with constipation in the past, but is very sedentary 2/2 her arthritis. Pt was educated that ambulation/regular activity will help with both px. I feel at this time pt is stable for d/c home, with close GI follow up. We had a lengthy conversation about today's findings. She was strongly encouraged to make a follow up with GI in the next 1-2 days. She stated that she would have the nurse manager at her assisted living make an appt tomorrow. Appears to have UTI and was given abx rx. Discussed reasons to return to ED. Pt verbalized understanding and agreed to plan.    Grant Fontana, [place] 04/23/11 [phone]

## 2011-04-23 NOTE — ED Notes (Signed)
Per ems: pt is unsure of her last bowel movement. Pt had a appointment with her GI doctor on Friday. Pt does not feel like she can make it to the doctor on Friday.

## 2011-04-24 ENCOUNTER — Telehealth: Payer: Self-pay | Admitting: Internal Medicine

## 2011-04-24 NOTE — Telephone Encounter (Signed)
Pt was seen in the ER for constipation and abdominal discomfort. Pt does not have transport to the office until Thursday. Pt scheduled to see Dr. Marina Goodell 04/26/11@9 :15am. Nursing home aware of appt date and time.

## 2011-04-24 NOTE — ED Provider Notes (Signed)
Medical screening examination/treatment/procedure(s) were performed by non-physician practitioner and as supervising physician I was immediately available for consultation/collaboration.   Nat Christen, MD 04/24/11 [phone]

## 2011-04-26 ENCOUNTER — Ambulatory Visit: Payer: Medicare Other | Admitting: Internal Medicine

## 2011-04-26 ENCOUNTER — Telehealth: Payer: Self-pay | Admitting: Internal Medicine

## 2011-04-26 LAB — URINE CULTURE: Culture  Setup Time: 201212180432

## 2011-04-26 NOTE — Telephone Encounter (Signed)
Spoke with Victorino Dike and pt missed her appt today, they accidentally wrote down the wrong time. Pt rescheduled to see Mike Gip PA tomorrow at 2pm. Victorino Dike aware of appt date and time.

## 2011-04-26 NOTE — ED Notes (Addendum)
Chart sent to EDP office for review of positive urine culture showing ESBL. Patient f/u with PCP

## 2011-04-27 ENCOUNTER — Ambulatory Visit (INDEPENDENT_AMBULATORY_CARE_PROVIDER_SITE_OTHER): Payer: Medicare Other | Admitting: Physician Assistant

## 2011-04-27 ENCOUNTER — Encounter: Payer: Self-pay | Admitting: Physician Assistant

## 2011-04-27 VITALS — Ht 66.0 in | Wt 176.4 lb

## 2011-04-27 DIAGNOSIS — R141 Gas pain: Secondary | ICD-10-CM

## 2011-04-27 DIAGNOSIS — R140 Abdominal distension (gaseous): Secondary | ICD-10-CM

## 2011-04-27 DIAGNOSIS — K5900 Constipation, unspecified: Secondary | ICD-10-CM

## 2011-04-27 DIAGNOSIS — K5909 Other constipation: Secondary | ICD-10-CM

## 2011-04-27 MED ORDER — RIFAXIMIN 550 MG PO TABS: 550.0000 mg | ORAL_TABLET | Freq: Two times a day (BID) | ORAL | Status: DC

## 2011-04-27 MED ORDER — ALIGN PO CAPS: 1.0000 | ORAL_CAPSULE | Freq: Every day | ORAL | Status: DC

## 2011-04-27 NOTE — Progress Notes (Signed)
Agree with assessment and plan 

## 2011-04-27 NOTE — Patient Instructions (Signed)
We have given you samples of Align capsule ( probiotic)  28 days worth. Take 1 capsule daily.  It would be good for her to take daily.  It is an over the counter drug.  We also gave you samples of Xifaxan 550 mg. Take 1 tab twice daily for 6 days.  Stay on the Amitiza twice daily. Use the Fleets enema as needed for constipation. Decrease the Citrucel to once daily.

## 2011-04-27 NOTE — Progress Notes (Signed)
Subjective:    Patient ID: [name], female    DOB: [birth year], 65 y.o.   MRN: [id number]  HPI [name] is a 65 year old white female known to Dr. Marina Goodell from colonoscopy done in 2008. She had one small polyp and moderate diverticulosis.  polyp was hyperplastic. She has multiple medical issues and is currently a resident at an assisted living facility. She does have previously documented history of schizophrenia and bipolar disorder. She has suffered from long-term chronic constipation. She comes in today after in the ER visit at which time she had complained of abdominal pain distention and constipation. She had KUB done earlier this week which was negative and also had CT scan of the abdomen on pelvis which was unremarkable. She is chronically on Amitiza twice daily and uses a variety of other laxatives including enemas Citrucel and Colace for constipation symptoms. She said she went to the emergency room because she felt bloated and distended and this" scared her "". She says with Amitiza  twice daily she has bowel movements generally every day though she seems to have a window of time with which the medication is effective and if she does not get the bathroom and that period of time she won't go that day. If she does not have a bowel movement about every day she feels bloated gassy and uncomfortable. This can be alleviated by an enema but at times this is difficult to obtain as she is an assisted living facility. She started taking Citrucel over the past few days but has not noticed any change in her bowels. She says the bloating is better but she is concerned because all she passes as liquid stool and is wondering if this is okay. Is not noted any melena or hematochezia. She is currently being treated for a UTI with Bactrim has no complaints of nausea or vomiting.    Review of Systems  Constitutional: Negative.   HENT: Negative.   Eyes: Negative.   Respiratory: Negative.   Cardiovascular:  Negative.   Gastrointestinal: Positive for constipation and abdominal distention.  Genitourinary: Negative.   Skin: Negative.   Neurological: Negative.   Hematological: Negative.    Outpatient Prescriptions Prior to Visit  Medication Sig Dispense Refill  . acetaminophen (TYLENOL) 500 MG tablet Take 1,000 mg by mouth every 4 (four) hours as needed. pain      . ALPRAZolam (XANAX) 1 MG tablet Take 1 mg by mouth 3 (three) times daily.       Marland Kitchen amLODipine (NORVASC) 10 MG tablet Take 10 mg by mouth daily.        . cetirizine (ZYRTEC) 10 MG tablet Take 10 mg by mouth daily.        . cyclobenzaprine (FLEXERIL) 5 MG tablet Take 5 mg by mouth 3 (three) times daily as needed. pain       . docusate sodium (COLACE) 100 MG capsule Take 3 tablets by mouth every morning and 2 tablets by mouth at 4 pm      . famotidine (PEPCID) 20 MG tablet Take 20 mg by mouth 2 (two) times daily.        . fluticasone (FLONASE) 50 MCG/ACT nasal spray Place 1-2 sprays into the nose daily.        Marland Kitchen guaiFENesin (ROBITUSSIN) 100 MG/5ML liquid Take 200 mg by mouth 3 (three) times daily as needed. Take 2 to 3 teaspoons by mouth every four hours as needed for cough       . ibuprofen (ADVIL,MOTRIN)  200 MG tablet Take 400 mg by mouth every 4 (four) hours as needed. pain      . labetalol (NORMODYNE) 200 MG tablet Take 200 mg by mouth 2 (two) times daily.        Marland Kitchen lamoTRIgine (LAMICTAL) 200 MG tablet Take 200 mg by mouth 2 (two) times daily.       Marland Kitchen levothyroxine (SYNTHROID, LEVOTHROID) 50 MCG tablet Take 50 mcg by mouth daily.        Marland Kitchen lubiprostone (AMITIZA) 24 MCG capsule Take 24 mcg by mouth 2 (two) times daily with a meal.       . meclizine (ANTIVERT) 25 MG tablet Take 12.5-25 mg by mouth 4 (four) times daily as needed. dizziness       . nitrofurantoin (MACRODANTIN) 50 MG capsule Take 50 mg by mouth at bedtime. Pt takes daily      . nitrofurantoin, macrocrystal-monohydrate, (MACROBID) 100 MG capsule Take 1 capsule (100 mg total) by  mouth 2 (two) times daily.  14 capsule  0  . omega-3 acid ethyl esters (LOVAZA) 1 G capsule Take 4 g by mouth 2 (two) times daily. Pt takes 4 capsules daily      . Oxcarbazepine (TRILEPTAL) 300 MG tablet Take 600-900 mg by mouth 2 (two) times daily. Take two tablets by mouth each morning and three at bedtime      . risperiDONE (RISPERDAL) 2 MG tablet Take 4 mg by mouth 2 (two) times daily. Pt takes 2 tabs for a 4mg  dose. Twice daily      . simethicone (MYLICON) 80 MG chewable tablet Chew 80 mg by mouth 2 (two) times daily as needed. gas       . ALPRAZolam (XANAX) 1 MG tablet Take 1 mg by mouth at bedtime as needed. sleep       . guaiFENesin-dextromethorphan (ROBITUSSIN DM) 100-10 MG/5ML syrup Take 15 mLs by mouth 3 (three) times daily as needed. cough       . psyllium (HYDROCIL/METAMUCIL) 95 % PACK Take 1 packet by mouth daily.         No Known Allergies     Objective:   Physical Exam Well-developed chronically ill-appearing white female with severe kyphosis ambulates with a walker. Alert and oriented x3, pleasant. HEENT;nontraumatic normocephalic, EOMI PERRLA sclera anicteric, And severely kyphotic her chin is resting on her chest, Cardiovascular ; regular rate and rhythm with S1-S2 no murmur gallop, Pulmonary; clear bilaterally, Abdomen;protuberant, soft nondistended bowel sounds active nontender no palpable mass or hepatosplenomegaly, Rectal; exam no stool in the rectal vault, scant liquid stool is Hemoccult negative. Extremities; no clubbing cyanosis or edema, Psych; mood normal affect odd but appropriate        Assessment & Plan:  #45 65 year old female with long term chronic constipation and intermittent abdominal bloating and gas. She is very focused on her bowels and is concerned with loose lstools which are  secondary to her chronic laxative use. R/o component of bacterial overgrowth Plan; reassurance and long discussion regarding affects of laxatives and Amitiza. For the time being we  will continue Amitiza twice daily, She will continue Citrucel but decrease to once daily Okay for her to use when necessary and fleets enemas Add Align one by mouth daily Add Xifaxan 550 twice daily x7 days for possible bacterial overgrowth, she is asked to wait until she finishes her Bactrim prior to starting on the Xifaxan. Followup with Dr. Marina Goodell on a when necessary basis  #2 Diverticulosis #3 history of adenomatous colon  polyps last colonoscopy 2008 one small hyperplastic polyp consider followup colonoscopy at [age]-year .

## 2011-05-03 NOTE — ED Notes (Signed)
Patient stated that she was already on Macrobid and feels fine.

## 2011-05-11 ENCOUNTER — Ambulatory Visit: Payer: Medicare Other | Admitting: Internal Medicine

## 2011-05-16 ENCOUNTER — Encounter (HOSPITAL_COMMUNITY): Payer: Self-pay | Admitting: *Deleted

## 2011-05-16 ENCOUNTER — Emergency Department (HOSPITAL_COMMUNITY): Payer: Medicare Other

## 2011-05-16 ENCOUNTER — Emergency Department (HOSPITAL_COMMUNITY)
Admission: EM | Admit: 2011-05-16 | Discharge: 2011-05-17 | Disposition: A | Discharge status: Home / Self Care | Payer: Medicare Other | Attending: Emergency Medicine | Admitting: Emergency Medicine

## 2011-05-16 DIAGNOSIS — I10 Essential (primary) hypertension: Secondary | ICD-10-CM | POA: Insufficient documentation

## 2011-05-16 DIAGNOSIS — D414 Neoplasm of uncertain behavior of bladder: Secondary | ICD-10-CM | POA: Insufficient documentation

## 2011-05-16 DIAGNOSIS — K5900 Constipation, unspecified: Secondary | ICD-10-CM | POA: Insufficient documentation

## 2011-05-16 DIAGNOSIS — R10819 Abdominal tenderness, unspecified site: Secondary | ICD-10-CM | POA: Insufficient documentation

## 2011-05-16 DIAGNOSIS — M4000 Postural kyphosis, site unspecified: Secondary | ICD-10-CM | POA: Insufficient documentation

## 2011-05-16 DIAGNOSIS — E039 Hypothyroidism, unspecified: Secondary | ICD-10-CM | POA: Insufficient documentation

## 2011-05-16 DIAGNOSIS — Z9089 Acquired absence of other organs: Secondary | ICD-10-CM | POA: Insufficient documentation

## 2011-05-16 DIAGNOSIS — K5730 Diverticulosis of large intestine without perforation or abscess without bleeding: Secondary | ICD-10-CM | POA: Insufficient documentation

## 2011-05-16 HISTORY — DX: Disorder of kidney and ureter, unspecified: N28.9

## 2011-05-16 LAB — OCCULT BLOOD, POC DEVICE: Fecal Occult Bld: POSITIVE

## 2011-05-16 NOTE — ED Notes (Signed)
Pt brought in via [facility] and taken to hall bed and made comfortable.

## 2011-05-16 NOTE — ED Provider Notes (Signed)
History     CSN: [id number]  Arrival date & time 05/16/11  2010   First MD Initiated Contact with Patient 05/16/11 2127      Chief Complaint  Patient presents with  . Constipation    pt c/o constipation x 7 days. pt was given 2 suppositories at [facility] w/o any relief.      HPI  History provided by the patient. Patient is a 66 year old female with history of cholecystectomy and appendectomy who presents with complaints of constipation for the past week. Patient presenting from assisted living facility. She reports taking several stool softeners, using an enema, and rectal suppository as well as drinking magnesium citrate today without any improvements of her symptoms over the past week. She reports some associated nausea and diffuse abdominal tenderness and cramping. Also feels bloated and distended abdomen. She denies any aggravating or alleviating factors. She denies any vomiting. She has had decreased appetite. She denies any diarrhea, rectal bleeding or rectal pain. She reports similar symptoms in the past off and on. She is on daily stool softeners.    Past Medical History  Diagnosis Date  . Constipation   . Bipolar 1 disorder   . Hypertension   . Esophageal reflux   . Anxiety   . Back pain   . Osteoarthritis   . Gout   . Adenomatous polyps 03-27-07  . Diverticulosis   . Internal hemorrhoids   . Hypothyroidism     Past Surgical History  Procedure Date  . Cholecystectomy   . Appendectomy   . Tonsillectomy   . Tubal ligation     Family History  Problem Relation Age of Onset  . Hypertension Mother   . Atrial fibrillation Father   . Colon cancer Neg Hx   . Heart disease Mother     History  Substance Use Topics  . Smoking status: Former Smoker    Quit date: 05/08/2003  . Smokeless tobacco: Never Used  . Alcohol Use: No    OB History    Grav Para Term Preterm Abortions TAB SAB Ect Mult Living                  Review of Systems  Constitutional: Positive for  appetite change. Negative for fever and chills.  Respiratory: Negative for cough and shortness of breath.   Cardiovascular: Negative for chest pain.  Gastrointestinal: Positive for nausea, abdominal pain, constipation and abdominal distention. Negative for vomiting, diarrhea, blood in stool, anal bleeding and rectal pain.  Genitourinary: Negative for dysuria, frequency, hematuria and flank pain.  All other systems reviewed and are negative.    Allergies  Review of patient's allergies indicates no known allergies.  Home Medications   Current Outpatient Rx  Name Route Sig Dispense Refill  . ALPRAZOLAM 1 MG PO TABS Oral Take 1 mg by mouth 3 (three) times daily. SLEEP    . AMLODIPINE BESYLATE 10 MG PO TABS Oral Take 10 mg by mouth daily.      Marland Kitchen ALIGN PO CAPS Oral Take 1 capsule by mouth daily. 28 capsule 0    Lot # [id number] Exp date: 02/2012  . BISACODYL 10 MG RE SUPP Rectal Place 10 mg rectally as needed.      Marland Kitchen CETIRIZINE HCL 10 MG PO TABS Oral Take 10 mg by mouth daily.      Marland Kitchen DEXTROMETHORPHAN-GUAIFENESIN 10-100 MG/5ML PO LIQD Oral Take 10-15 mLs by mouth every 4 (four) hours as needed. COUGH    . DOCUSATE SODIUM 100  MG PO CAPS  200-300 mg 2 (two) times daily. Take 3 tablets by mouth every morning and 2 tablets by mouth at 4 pm    . FAMOTIDINE 20 MG PO TABS Oral Take 20 mg by mouth 2 (two) times daily.      Marland Kitchen FLUTICASONE PROPIONATE 50 MCG/ACT NA SUSP Nasal Place 1-2 sprays into the nose daily.      . IBUPROFEN 200 MG PO TABS Oral Take 400 mg by mouth every 4 (four) hours as needed. pain    . LABETALOL HCL 200 MG PO TABS Oral Take 200 mg by mouth 2 (two) times daily.      Marland Kitchen LAMOTRIGINE 200 MG PO TABS Oral Take 200 mg by mouth 2 (two) times daily.     Marland Kitchen LEVOTHYROXINE SODIUM 50 MCG PO TABS Oral Take 50 mcg by mouth daily.      . LUBIPROSTONE 24 MCG PO CAPS Oral Take 24 mcg by mouth 2 (two) times daily with a meal.     . NITROFURANTOIN MACROCRYSTAL 50 MG PO CAPS Oral Take 50 mg by mouth  at bedtime. Pt takes daily    . OMEGA-3-ACID ETHYL ESTERS 1 G PO CAPS Oral Take 4 g by mouth daily. Pt takes 4 capsules daily    . OXCARBAZEPINE 300 MG PO TABS Oral Take 600-900 mg by mouth 2 (two) times daily. Take two tablets by mouth each morning and three at bedtime    . RISPERIDONE 2 MG PO TABS Oral Take 4 mg by mouth 2 (two) times daily.       BP 132/55  Pulse 83  Temp(Src) 98.6 F (37 C) (Oral)  Resp 20  SpO2 94%  Physical Exam  Nursing note and vitals reviewed. Constitutional: She is oriented to person, place, and time. She appears well-developed and well-nourished. No distress.  HENT:  Head: Normocephalic.  Cardiovascular: Normal rate and regular rhythm.   Pulmonary/Chest: Effort normal and breath sounds normal. She has no wheezes. She has no rales.  Abdominal: Soft. There is CVA tenderness. There is no rebound, no guarding, no tenderness at McBurney's point and negative Murphy's sign.       Dullness with percussion throughout abdomen except in the epigastric and right upper quadrant areas. Mild diffuse tenderness. Mild distention. No hernias. Laparoscopic incisional scars consistent with prior surgeries. Patient is obese.  Genitourinary: Rectum normal. Rectal exam shows no external hemorrhoid, no fissure and no tenderness.       Chaperone was present. No stool in vault or fecal impaction. No masses.  Musculoskeletal:       Kyphosis  Neurological: She is alert and oriented to person, place, and time.  Skin: Skin is warm and dry. No rash noted.  Psychiatric: She has a normal mood and affect. Her behavior is normal.    ED Course  Procedures   Labs Reviewed  CBC - Abnormal; Notable for the following:    WBC 1.7 (*)    RBC 5.97 (*)    Hemoglobin 18.6 (*)    HCT 54.1 (*)    Platelets 98 (*)    All other components within normal limits  DIFFERENTIAL - Abnormal; Notable for the following:    Neutro Abs 1.2 (*)    Lymphs Abs 0.3 (*)    All other components within normal  limits  BASIC METABOLIC PANEL - Abnormal; Notable for the following:    CO2 18 (*)    BUN 32 (*)    Creatinine, Ser 2.04 (*)  GFR calc non Af Amer 25 (*)    GFR calc Af Amer 29 (*)    All other components within normal limits  OCCULT BLOOD, POC DEVICE  POCT OCCULT BLOOD STOOL, DEVICE  CBC   Results for orders placed during the hospital encounter of 05/16/11  OCCULT BLOOD, POC DEVICE      Component Value Range   Fecal Occult Bld POSITIVE    CBC      Component Value Range   WBC 1.7 (*) 4.0 - 10.5 (K/uL)   RBC 5.97 (*) 3.87 - 5.11 (MIL/uL)   Hemoglobin 18.6 (*) 12.0 - 15.0 (g/dL)   HCT 40.9 (*) 81.1 - 46.0 (%)   MCV 90.6  78.0 - 100.0 (fL)   MCH 31.2  26.0 - 34.0 (pg)   MCHC 34.4  30.0 - 36.0 (g/dL)   RDW 91.4  78.2 - 95.6 (%)   Platelets 98 (*) 150 - 400 (K/uL)  DIFFERENTIAL      Component Value Range   Neutrophils Relative 72  43 - 77 (%)   Lymphocytes Relative 16  12 - 46 (%)   Monocytes Relative 7  3 - 12 (%)   Eosinophils Relative 4  0 - 5 (%)   Basophils Relative 1  0 - 1 (%)   Neutro Abs 1.2 (*) 1.7 - 7.7 (K/uL)   Lymphs Abs 0.3 (*) 0.7 - 4.0 (K/uL)   Monocytes Absolute 0.1  0.1 - 1.0 (K/uL)   Eosinophils Absolute 0.1  0.0 - 0.7 (K/uL)   Basophils Absolute 0.0  0.0 - 0.1 (K/uL)   Smear Review PLATELET COUNT CONFIRMED BY SMEAR    BASIC METABOLIC PANEL      Component Value Range   Sodium 136  135 - 145 (mEq/L)   Potassium 4.3  3.5 - 5.1 (mEq/L)   Chloride 105  96 - 112 (mEq/L)   CO2 18 (*) 19 - 32 (mEq/L)   Glucose, Bld 89  70 - 99 (mg/dL)   BUN 32 (*) 6 - 23 (mg/dL)   Creatinine, Ser 2.13 (*) 0.50 - 1.10 (mg/dL)   Calcium 9.9  8.4 - 08.6 (mg/dL)   GFR calc non Af Amer 25 (*) >90 (mL/min)   GFR calc Af Amer 29 (*) >90 (mL/min)     Ct Abdomen Pelvis Wo Contrast  05/17/2011  *RADIOLOGY REPORT*  Clinical Data: Colitis.  Constipation.  CT ABDOMEN AND PELVIS WITHOUT CONTRAST  Technique:  Multidetector CT imaging of the abdomen and pelvis was performed following  the standard protocol without intravenous contrast.  Comparison: 04/12/2011.  Findings: Lung bases show dependent atelectasis.  Cholecystectomy. Noncontrast appearance of the liver is unchanged.  Bilateral renal parenchymal calcifications appear similar to prior exam.  There is urothelial thickening in the renal pelvis bilaterally, greater on the right than left.  Although nonspecific, this can be associated with chronic urinary tract infection.  Correlation with urinalysis is recommended.  Uterus appears within normal limits.  There is a small hyperdense nodule in the anterior urinary bladder which is unchanged compared to recent prior. Follow-up cystoscopy recommended.  Although nonspecific, potentially this could represent neoplasm.  Severe colonic diverticulosis is present.  Liquid stool is present throughout the colon which is abnormal but nonspecific, most commonly associated with enteric infection.  There is no colonic mural thickening.  No aggressive osseous lesions are present.  Lumbar facet arthrosis and spondylosis. Small hiatal hernia.  Pancreas appears normal.  Stomach and proximal small bowel normal.  IMPRESSION: 1.  Liquid stool present throughout the colon which is abnormal but nonspecific. Most commonly this is associated with enteric infection.  The distribution is more extensive than on the recent examination of 04/12/2011. 2.  Cholecystectomy. 3.  Renal parenchymal calcification consistent with chronic renal disease.  Left upper pole renal sinus cyst. 4.  Renal pelvis urothelial thickening, greater on the right than left.  This can be associated with chronic infection but is nonspecific.  Correlate with urinalysis. 5.  Small hypodense nodule measuring 7 mm in the anterior right urinary bladder.  Follow-up cystoscopy recommended.  Original Report Authenticated By: Andreas Newport, M.D.   Dg Abd Acute W/chest  05/16/2011  *RADIOLOGY REPORT*  Clinical Data: The patient.  Small bowel obstruction.   ACUTE ABDOMEN SERIES (ABDOMEN 2 VIEW & CHEST 1 VIEW)  Comparison: 04/23/2011.  Findings: Chronic pulmonary parenchymal changes of the chest. Emphysema.  Cardiopericardial silhouette unchanged.  No free air underneath the hemidiaphragms. Cholecystectomy clips are present in the right upper quadrant.  No pathologically dilated loops of small bowel.  Scattered nonspecific air-fluid levels are present within colon and small bowel, most commonly associated with enteric infection.  The appearance is similar to 04/23/2011.  IMPRESSION:  1.  Stable appearance of the chest with emphysema and chronic pulmonary parenchymal changes. 2.  No free air. 3.  Scattered loops of nondilated large and small bowel with air- fluid levels.  Although nonspecific, this is most commonly associated with enteric infection and the appearance is very similar to 04/23/2011.  Original Report Authenticated By: Andreas Newport, M.D.     1. Bladder polyp   2. Constipation       MDM  9:30 PM patient seen and evaluated. Patient in no acute distress.   Spoke with Dr. Darrold Span oncologist hematology oncology about patient's CBC findings. She will arrange for close followup though she reports the next available new patient appointment is 10-14 days out. She recommends repeating CBC just to be sure of values. She also recommends having patient see her primary care provider Dr. Andrey Campanile this week for repeat CBC. Patient otherwise appears well and has no concerning or emergent findings on her CT scan. Plan to have patient continue her normal medications with close followup at the assisted-living facility.   Repeat a CBC shows normal lab values. Have instructed patient on all of the findings today including the lesion in the bladder. She's been given urology referral. Patient states she has an upcoming appointment with her GI doctor. He has been advised to keep this appointment and also followup with her PCP.  Angus Seller, [place] 05/17/11 [phone]

## 2011-05-16 NOTE — ED Notes (Signed)
[id number]<AV> Expected date:05/16/11<BR> Expected time: 7:56 PM<BR> Means of arrival:Ambulance<BR> Comments:<BR> EMS [facility], constipation

## 2011-05-17 ENCOUNTER — Emergency Department (HOSPITAL_COMMUNITY): Payer: Medicare Other

## 2011-05-17 ENCOUNTER — Encounter (HOSPITAL_COMMUNITY): Payer: Self-pay | Admitting: Radiology

## 2011-05-17 LAB — CBC
HCT: 24.6 % — ABNORMAL LOW (ref 36.0–46.0)
HCT: 54.1 % — ABNORMAL HIGH (ref 36.0–46.0)
Hemoglobin: 18.6 g/dL — ABNORMAL HIGH (ref 12.0–15.0)
Hemoglobin: 8.2 g/dL — ABNORMAL LOW (ref 12.0–15.0)
MCH: 30.5 pg (ref 26.0–34.0)
MCHC: 33.3 g/dL (ref 30.0–36.0)
WBC: 1.7 10*3/uL — ABNORMAL LOW (ref 4.0–10.5)

## 2011-05-17 LAB — BASIC METABOLIC PANEL
BUN: 32 mg/dL — ABNORMAL HIGH (ref 6–23)
CO2: 18 mEq/L — ABNORMAL LOW (ref 19–32)
Chloride: 105 mEq/L (ref 96–112)
GFR calc Af Amer: 29 mL/min — ABNORMAL LOW (ref >90)
Glucose, Bld: 89 mg/dL (ref 70–99)
Potassium: 4.3 mEq/L (ref 3.5–5.1)

## 2011-05-17 LAB — DIFFERENTIAL
Basophils Relative: 1 % (ref 0–1)
Eosinophils Relative: 4 % (ref 0–5)
Lymphocytes Relative: 16 % (ref 12–46)
Monocytes Relative: 7 % (ref 3–12)
Neutro Abs: 1.2 10*3/uL — ABNORMAL LOW (ref 1.7–7.7)

## 2011-05-17 NOTE — ED Notes (Signed)
[facility] retirement   [phone]  - [name]

## 2011-05-17 NOTE — ED Notes (Signed)
Patient stable upon discharged.  Called [facility] for transport and called facility and gave them report.

## 2011-05-18 NOTE — ED Provider Notes (Signed)
Medical screening examination/treatment/procedure(s) were performed by non-physician practitioner and as supervising physician I was immediately available for consultation/collaboration.   Jane Birkel A. Leimomi Zervas, MD 05/18/11 1133 

## 2011-05-20 ENCOUNTER — Emergency Department (HOSPITAL_COMMUNITY): Payer: Medicare Other

## 2011-05-20 ENCOUNTER — Emergency Department (HOSPITAL_COMMUNITY)
Admission: EM | Admit: 2011-05-20 | Discharge: 2011-05-20 | Disposition: A | Discharge status: Home / Self Care | Payer: Medicare Other | Attending: Emergency Medicine | Admitting: Emergency Medicine

## 2011-05-20 ENCOUNTER — Encounter (HOSPITAL_COMMUNITY): Payer: Self-pay | Admitting: Emergency Medicine

## 2011-05-20 DIAGNOSIS — M1990 Unspecified osteoarthritis, unspecified site: Secondary | ICD-10-CM | POA: Insufficient documentation

## 2011-05-20 DIAGNOSIS — K219 Gastro-esophageal reflux disease without esophagitis: Secondary | ICD-10-CM | POA: Insufficient documentation

## 2011-05-20 DIAGNOSIS — Z79899 Other long term (current) drug therapy: Secondary | ICD-10-CM | POA: Insufficient documentation

## 2011-05-20 DIAGNOSIS — M25559 Pain in unspecified hip: Secondary | ICD-10-CM | POA: Insufficient documentation

## 2011-05-20 DIAGNOSIS — M545 Low back pain, unspecified: Secondary | ICD-10-CM | POA: Insufficient documentation

## 2011-05-20 DIAGNOSIS — F319 Bipolar disorder, unspecified: Secondary | ICD-10-CM | POA: Insufficient documentation

## 2011-05-20 DIAGNOSIS — I10 Essential (primary) hypertension: Secondary | ICD-10-CM | POA: Insufficient documentation

## 2011-05-20 DIAGNOSIS — W19XXXA Unspecified fall, initial encounter: Secondary | ICD-10-CM

## 2011-05-20 DIAGNOSIS — Z8639 Personal history of other endocrine, nutritional and metabolic disease: Secondary | ICD-10-CM | POA: Insufficient documentation

## 2011-05-20 DIAGNOSIS — E039 Hypothyroidism, unspecified: Secondary | ICD-10-CM | POA: Insufficient documentation

## 2011-05-20 DIAGNOSIS — F411 Generalized anxiety disorder: Secondary | ICD-10-CM | POA: Insufficient documentation

## 2011-05-20 DIAGNOSIS — M25552 Pain in left hip: Secondary | ICD-10-CM

## 2011-05-20 DIAGNOSIS — Z862 Personal history of diseases of the blood and blood-forming organs and certain disorders involving the immune mechanism: Secondary | ICD-10-CM | POA: Insufficient documentation

## 2011-05-20 NOTE — ED Notes (Signed)
Patient states she turned around too quick and fell hurting low back.

## 2011-05-20 NOTE — ED Provider Notes (Signed)
History     CSN: [id number]  Arrival date & time 05/20/11  1478   First MD Initiated Contact with Patient 05/20/11 1729      Chief Complaint  Patient presents with  . Fall    (Consider location/radiation/quality/duration/timing/severity/associated sxs/prior treatment) HPI... [facility] at [facility] home. Complains of pain in left lateral hip and lower back. No head or neck trauma. No extremity pain. Patient makes it worse. Pain is minimal and sharp.  Past Medical History  Diagnosis Date  . Constipation   . Bipolar 1 disorder   . Hypertension   . Esophageal reflux   . Anxiety   . Back pain   . Osteoarthritis   . Gout   . Adenomatous polyps 03-27-07  . Diverticulosis   . Internal hemorrhoids   . Hypothyroidism   . Renal insufficiency     Past Surgical History  Procedure Date  . Cholecystectomy   . Appendectomy   . Tonsillectomy   . Tubal ligation     Family History  Problem Relation Age of Onset  . Hypertension Mother   . Atrial fibrillation Father   . Colon cancer Neg Hx   . Heart disease Mother     History  Substance Use Topics  . Smoking status: Former Smoker    Quit date: 05/08/2003  . Smokeless tobacco: Never Used  . Alcohol Use: No    OB History    Grav Para Term Preterm Abortions TAB SAB Ect Mult Living                  Review of Systems  All other systems reviewed and are negative.    Allergies  Review of patient's allergies indicates no known allergies.  Home Medications   Current Outpatient Rx  Name Route Sig Dispense Refill  . ACETAMINOPHEN 500 MG PO TABS Oral Take 1,000 mg by mouth every 4 (four) hours as needed. For pain    . ALPRAZOLAM 1 MG PO TABS Oral Take 1 mg by mouth 3 (three) times daily.     Marland Kitchen ALPRAZOLAM 1 MG PO TABS Oral Take 1 mg by mouth at bedtime as needed. For sleep    . AMLODIPINE BESYLATE 10 MG PO TABS Oral Take 10 mg by mouth daily.      Marland Kitchen ALIGN PO CAPS Oral Take 1 capsule by mouth daily. 28  capsule 0    Lot # [id number] Exp date: 02/2012  . CETIRIZINE HCL 10 MG PO TABS Oral Take 10 mg by mouth daily.      . CYCLOBENZAPRINE HCL 5 MG PO TABS Oral Take 5 mg by mouth 3 (three) times daily as needed. For muscle spasms    . DEXTROMETHORPHAN-GUAIFENESIN 10-100 MG/5ML PO LIQD Oral Take 10-15 mLs by mouth every 4 (four) hours as needed. COUGH    . DOCUSATE SODIUM 100 MG PO CAPS  200-300 mg 2 (two) times daily. Take 3 capsules by mouth every morning and 2 capsules by mouth at 4 pm    . FAMOTIDINE 20 MG PO TABS Oral Take 20 mg by mouth 2 (two) times daily.      Marland Kitchen FLUTICASONE PROPIONATE 50 MCG/ACT NA SUSP Nasal Place 1-2 sprays into the nose daily.     Marland Kitchen LABETALOL HCL 200 MG PO TABS Oral Take 200 mg by mouth 2 (two) times daily.      Marland Kitchen LAMOTRIGINE 200 MG PO TABS Oral Take 200 mg by mouth 2 (two) times daily.     Marland Kitchen  LEVOTHYROXINE SODIUM 50 MCG PO TABS Oral Take 50 mcg by mouth daily.      . LUBIPROSTONE 24 MCG PO CAPS Oral Take 24 mcg by mouth 2 (two) times daily with a meal.     . MECLIZINE HCL 25 MG PO TABS Oral Take 12.5-25 mg by mouth 4 (four) times daily as needed. For dizziness    . METHYLCELLULOSE (LAXATIVE) PO POWD Oral Take 1 packet by mouth daily as needed. For constipation    . NITROFURANTOIN MACROCRYSTAL 50 MG PO CAPS Oral Take 50 mg by mouth at bedtime. Pt takes daily    . OMEGA-3-ACID ETHYL ESTERS 1 G PO CAPS Oral Take 4 g by mouth daily.     Marland Kitchen OVER THE COUNTER MEDICATION Oral Take 5 oz by mouth daily as needed. For constipation  4oz. Prune juice with 1oz. Mineral oil    . OXCARBAZEPINE 300 MG PO TABS Oral Take 600-900 mg by mouth 2 (two) times daily. Take two tablets by mouth each morning and three at bedtime    . RISPERIDONE 2 MG PO TABS Oral Take 4 mg by mouth 2 (two) times daily.     Marland Kitchen GAS-X PO Oral Take 1 tablet by mouth 2 (two) times daily as needed. As needed for gas. Gas-x chewable tablet    . FLEET ENEMA RE Rectal Place 1 each rectally daily as needed. For constipation      . BISACODYL 10 MG RE SUPP Rectal Place 10 mg rectally as needed. For constipation    . IBUPROFEN 200 MG PO TABS Oral Take 400 mg by mouth every 4 (four) hours as needed. pain      BP 137/61  Pulse 62  Temp(Src) 97.7 F (36.5 C) (Oral)  Resp 18  SpO2 100%  Physical Exam  Nursing note and vitals reviewed. Constitutional: She is oriented to person, place, and time. She appears well-developed and well-nourished.  HENT:  Head: Normocephalic and atraumatic.  Eyes: Conjunctivae and EOM are normal. Pupils are equal, round, and reactive to light.  Neck: Normal range of motion. Neck supple.  Cardiovascular: Normal rate and regular rhythm.   Pulmonary/Chest: Effort normal and breath sounds normal.  Abdominal: Soft. Bowel sounds are normal.  Musculoskeletal:       Minimal tenderness lower back and left lateral hip.  Neurological: She is alert and oriented to person, place, and time.  Skin: Skin is warm and dry.  Psychiatric: She has a normal mood and affect.    ED Course  Procedures (including critical care time)  Labs Reviewed - No data to display Dg Lumbar Spine Complete  05/20/2011  *RADIOLOGY REPORT*  Clinical Data: Fall, pain.  LUMBAR SPINE - COMPLETE 4+ VIEW  Comparison: CT abdomen and pelvis 05/17/2011.  Findings: There is no fracture or subluxation of the lumbar spine. Scattered mild appearing degenerative change is noted.  IMPRESSION: No acute finding.  Original Report Authenticated By: Bernadene Bell. D'ALESSIO, M.D.   Dg Hip Complete Left  05/20/2011  *RADIOLOGY REPORT*  Clinical Data: Left hip pain.  LEFT HIP - COMPLETE 2+ VIEW  Comparison: Plain films 11/25/2009.  Findings: Both hips are located.  There is no fracture.  No notable degenerative change.  IMPRESSION: Negative study.  Original Report Authenticated By: Bernadene Bell. D'ALESSIO, M.D.     1. Fall   2. Left hip pain   3. Lower back pain       MDM  X-rays of left hip and lumbar spine are negative. Patient is  in no  acute distress        Donnetta Hutching, MD 05/20/11 1944

## 2011-05-20 NOTE — ED Notes (Signed)
Pt to ED via [facility].  Pt states she turned around too fast yesterday and lost her balance and fell.  C/o pain to L hip and lower back.  Denies LOC.  Denies neck pain. [name].

## 2011-05-20 NOTE — ED Notes (Signed)
[facility] contacted to transport pt back to [facility]  per nurses request

## 2011-05-21 ENCOUNTER — Ambulatory Visit: Payer: Medicare Other | Admitting: Internal Medicine

## 2011-08-21 ENCOUNTER — Encounter (HOSPITAL_COMMUNITY): Payer: Medicare Other

## 2011-11-05 ENCOUNTER — Telehealth: Payer: Self-pay | Admitting: Internal Medicine

## 2011-11-05 NOTE — Telephone Encounter (Signed)
Pt c/o constipation and abdominal pain. Requesting pt be seen. Pt scheduled to see Willette Cluster NP 11/07/11@2pm . Spoke with Tammy regarding the appt.

## 2011-11-07 ENCOUNTER — Encounter: Payer: Self-pay | Admitting: Nurse Practitioner

## 2011-11-07 ENCOUNTER — Ambulatory Visit (INDEPENDENT_AMBULATORY_CARE_PROVIDER_SITE_OTHER): Payer: Medicare Other | Admitting: Nurse Practitioner

## 2011-11-07 VITALS — BP 130/70 | HR 70 | Ht 66.0 in | Wt 170.4 lb

## 2011-11-07 DIAGNOSIS — K5900 Constipation, unspecified: Secondary | ICD-10-CM

## 2011-11-07 MED ORDER — HYDROCORTISONE ACETATE 25 MG RE SUPP: 25.0000 mg | Freq: Every day | RECTAL | Status: DC

## 2011-11-07 NOTE — Patient Instructions (Addendum)
We have given you a prescription for Suppositories.   Patient needs to purge her bowels. Please:  Stop Amitiza. Begin Linzess for constipation. Sample given of Linzess . Take one every morning.   Suprep directions:  Step 1:  Pour one 6 oz bottle of Suprep liquid into the mixing container. Step 2: Add cool drinking water to the 16 oz container and mix.   Step 3: Drink all the liquid in the container. Step 4:  You must drink 2 more 16 oz containers of water over the next 1 hour.  At 6pm today give first dose of Suprep at 6pm. Drink complete prep. Give a Dulcolax suppository 7pm At 8am tomorrow give 2nd dose of Suprep. Drink complete drink. If no BM, give a Dulcolax suppository.   Call office if patient didn't bowel prep or didn't have bowel movement.  Lowry Ram, CMA.  [phone]

## 2011-11-09 ENCOUNTER — Encounter: Payer: Self-pay | Admitting: Nurse Practitioner

## 2011-11-09 NOTE — Progress Notes (Signed)
[name] [id number] [birth year]   HISTORY OR PRESENT ILLNESS :  Patient is a 66 year old female with multiple medical problems. She is followed by [place] for adenomatous colon polyps and chronic constipation for which she is on twice daily Amitiza. She had a complete colonoscopy November 2008 with findings of diverticulosis and polyps (adenomatous).  Patient was last seen December 2012 and that was for evaluation of her chronic constipation as well as some abdominal bloating. She was advised to continue twice daily Amitiza. Her Citrucel was decrease to once daily and she was to use fleets enemas as needed. In addition, patient was treated with Align for possible bacterial overgrowth.  Patient comes in today with obstipation, reports no bowel movement in 5 weeks. She is taking twice daily Amitia, she has tried using suppositories. No nausea or vomiting  Current Medications, Allergies, Past Medical History, Past Surgical History, Family History and Social History were reviewed in [employer] record.   PHYSICAL EXAMINATION : General:  Chronically ill appearing white female in no acute distress Head: Normocephalic and atraumatic Eyes:  sclerae anicteric,conjunctive pink. Ears: Normal auditory acuity Neck: Supple, no masses.  Lungs: Clear throughout to auscultation Heart: Regular rate and rhythm Abdomen: Soft, obese ,nontender. No masses or hepatomegaly noted. Normal bowel sounds Rectal:No stool in the vault Skin: No lesions on visible extremities Extremities: No edema or deformities noted Neurological: Oriented x 4, grossly nonfocal Cervical Nodes:  No significant cervical adenopathy Psychological:  Alert and cooperative. Normal mood and affect  ASSESSMENT AND PLAN :  96.  66 year old female with acute on chronic constipation. She is not impacted on exam. Patient taking Amitiza twice a day, she has also tried suppositories appear. Will purge bowels with a bowel prep  then try her on Linzess. Discontinue Amitiza.  I have written out instructions for the nursing facility regarding bowel prep and have asked them to call if patient does not tolerate prep or does not have a good response to the prep. Otherwise, patient can followup in a couple of months to assess response to Linzess  2.  multiple medical problems including, but not limited to hypertension, hypothyroidism, renal insufficiency, schizophrenia and bipolar disorder.   3, History of adenomatous colon polyps. She had a small adenomatous polyp removed in November 2008. Recall colonoscopy recommended at 5 year mark so she would be due in a few months. Screening guidelines have changed, will defer to Dr. Marina Goodell as to timing of next surveillance colonoscopy.

## 2011-11-11 NOTE — Progress Notes (Signed)
i agree with the plan outlined in this note 

## 2011-11-12 NOTE — Progress Notes (Signed)
Yes, she has a history of adenomatous with high-grade dysplasia. Her recall colonoscopy should be November 2013. If you think she should be done sooner, then go ahead and schedule this. Otherwise, make sure she has a recall in the computer at the appropriate time as noted. Thank you.

## 2011-11-19 ENCOUNTER — Telehealth: Payer: Self-pay | Admitting: Internal Medicine

## 2011-11-22 ENCOUNTER — Telehealth: Payer: Self-pay | Admitting: *Deleted

## 2011-11-22 NOTE — Telephone Encounter (Signed)
This patient had seen Willette Cluster on 11-07-2011 and prescribed Linzess 145 mcg.  I did a prior authorization for the insurance company and they will not cover the Linzess.  I spoke to the pharmacist and he advised me they denied the coverage for the Linzess 145 mcg.  I told him to have the patient come here for samples.  Someone came to our 3rd floor and we accidentally gave  the 290 mcg Linzess to the person who picked up the samples.   The patient had taken 1 capsule by mouth in the Am on 11-22-2011.  A nurse call me and advised our office gave the wrong dosage. I have put the correct dosage at the front desk for [place] to pick up.

## 2011-11-23 NOTE — Telephone Encounter (Signed)
Another Phone Note was created. Samples put at front desk for Pt.

## 2011-11-28 ENCOUNTER — Telehealth: Payer: Self-pay

## 2011-11-28 NOTE — Telephone Encounter (Signed)
Spoke with Victorino Dike at [facility] and advised her to contact pt's insurance or pharmacist to see what options might be more affordable than Linzess.  She is going to check into this and call me back to discuss further options

## 2011-12-03 ENCOUNTER — Telehealth: Payer: Self-pay | Admitting: Nurse Practitioner

## 2011-12-14 ENCOUNTER — Telehealth: Payer: Self-pay | Admitting: Internal Medicine

## 2011-12-14 NOTE — Telephone Encounter (Signed)
Pt having problems with constipation. Requesting to be seen. Pt scheduled to see Mike Gip PA 12/19/11@11am .

## 2011-12-18 ENCOUNTER — Encounter: Payer: Self-pay | Admitting: Physician Assistant

## 2011-12-19 ENCOUNTER — Ambulatory Visit (INDEPENDENT_AMBULATORY_CARE_PROVIDER_SITE_OTHER): Payer: Medicare Other | Admitting: Physician Assistant

## 2011-12-19 ENCOUNTER — Encounter: Payer: Self-pay | Admitting: Physician Assistant

## 2011-12-19 VITALS — BP 130/72 | HR 80 | Ht 66.0 in | Wt 165.0 lb

## 2011-12-19 DIAGNOSIS — K5900 Constipation, unspecified: Secondary | ICD-10-CM

## 2011-12-19 MED ORDER — NA SULFATE-K SULFATE-MG SULF 17.5-3.13-1.6 GM/177ML PO SOLN: 1.0000 | Freq: Once | ORAL | Status: DC

## 2011-12-19 MED ORDER — LINACLOTIDE 145 MCG PO CAPS: 145.0000 ug | ORAL_CAPSULE | Freq: Every day | ORAL | Status: DC

## 2011-12-19 NOTE — Patient Instructions (Addendum)
You have been given samples of Linzess and one Suprep bowel kit. Please take as directed.   Follow up with Dr. Marina Goodell  in 1 month.

## 2011-12-19 NOTE — Progress Notes (Signed)
I agree with the plan outlined in this note 

## 2011-12-19 NOTE — Progress Notes (Signed)
Subjective:    Patient ID: [name], female    DOB: 02/26/1946, 66 y.o.   MRN: [id number]  HPI Viviann is a 66 year old female known to Dr. Marina Goodell from colonoscopy done in November of 2008. She did have an adenomatous polyp at that time. She has problems with chronic constipation in addition to history of schizophrenia, bipolar disorder,degenerative joint disease, COPD, GERD, hypertension, and chronic anxiety. She has been seen in our office couple of times this year were constipation. She had been previously on Amitiza which was not working on a Geophysicist/field seismologist basis. She has tried MiraLax in the past without good success. She was last seen in July of 2013 and at that time received a bowel prep to purge her bowels and was then started on Linzess. She felt much better after the prep and feels that the Linzess had been working well. However she has been unable to get a prescription for this will cost. We have been giving her samples. She comes in today with another episode of obstipation She says she 8 several episodes several helpings of spaghetti about 10 days ago which made her constipated and she's been unable to have a bowel movement since despite apparently taking the Linzess  daily. She says she feels somewhat bloated and distended has been passing gas but no bowel movement. She does have prior history of fecal impaction but does not feel impacted at this time. She says she gave herself an enema within the past couple of days without any results.    Review of Systems  HENT: Negative.   Eyes: Negative.   Respiratory: Negative.   Gastrointestinal: Positive for constipation.  Genitourinary: Negative.   Musculoskeletal: Positive for back pain and gait problem.  Neurological: Positive for weakness.  Hematological: Negative.   Psychiatric/Behavioral: Positive for behavioral problems.   Outpatient Prescriptions Prior to Visit  Medication Sig Dispense Refill  . acetaminophen (TYLENOL) 500 MG tablet  Take 1,000 mg by mouth every 4 (four) hours as needed. For pain      . ALPRAZolam (XANAX) 1 MG tablet Take 1 mg by mouth 3 (three) times daily.       Marland Kitchen ALPRAZolam (XANAX) 1 MG tablet Take 1 mg by mouth at bedtime as needed. For sleep      . amLODipine (NORVASC) 10 MG tablet Take 10 mg by mouth daily.        . cetirizine (ZYRTEC) 10 MG tablet Take 10 mg by mouth daily.        Marland Kitchen dextromethorphan-guaiFENesin (TUSSIN DM) 10-100 MG/5ML liquid Take 10-15 mLs by mouth every 4 (four) hours as needed. COUGH      . docusate sodium (COLACE) 100 MG capsule 200-300 mg 2 (two) times daily. Take 3 capsules by mouth every morning and 2 capsules by mouth at 4 pm      . famotidine (PEPCID) 20 MG tablet Take 20 mg by mouth 2 (two) times daily.        . fluticasone (FLONASE) 50 MCG/ACT nasal spray Place 1-2 sprays into the nose daily.       Marland Kitchen labetalol (NORMODYNE) 200 MG tablet Take 200 mg by mouth 2 (two) times daily.        Marland Kitchen lamoTRIgine (LAMICTAL) 200 MG tablet Take 200 mg by mouth 2 (two) times daily.       Marland Kitchen levothyroxine (SYNTHROID, LEVOTHROID) 50 MCG tablet Take 50 mcg by mouth daily.        . meclizine (ANTIVERT) 25 MG tablet Take  12.5-25 mg by mouth 4 (four) times daily as needed. For dizziness      . nitrofurantoin (MACRODANTIN) 50 MG capsule Take 50 mg by mouth at bedtime. Pt takes daily      . omega-3 acid ethyl esters (LOVAZA) 1 G capsule Take 4 g by mouth daily.       . Oxcarbazepine (TRILEPTAL) 300 MG tablet Take 600-900 mg by mouth 2 (two) times daily. Take two tablets by mouth each morning and three at bedtime      . risperiDONE (RISPERDAL) 2 MG tablet Take 4 mg by mouth 2 (two) times daily.       . Sodium Phosphates (FLEET ENEMA RE) Place 1 each rectally daily as needed. For constipation      . traMADol (ULTRAM) 50 MG tablet Take 50 mg by mouth. Take one pill twice a day      . hydrocortisone (ANUSOL-HC) 25 MG suppository Place 1 suppository (25 mg total) rectally at bedtime.  7 suppository  1  .  lubiprostone (AMITIZA) 24 MCG capsule Take 24 mcg by mouth 2 (two) times daily with a meal.        No Known Allergies     Patient Active Problem List  Diagnosis  . TUBULOVILLOUS ADENOMA, COLON  . HYPOTHYROIDISM  . MIXED HYPERLIPIDEMIA  . ANEMIA, IRON DEFICIENCY  . BPLR I, DPRSD, MOST RECENT EPSD, MANIC, SVR  . ANXIETY  . DEPRESSION  . PROPTOSIS  . HYPERTENSION  . EMPHYSEMA  . GERD  . DIVERTICULOSIS OF COLON  . CONSTIPATION, CHRONIC  . RENAL INSUFFICIENCY, CHRONIC  . RENAL FAILURE  . DEGENERATIVE JOINT DISEASE  . OSTEOARTHRITIS, HIP, RIGHT  . DEGENERATIVE JOINT DISEASE, SPINE  . VERTIGO  . CHEST PAIN  . VILLOUS ADENOMA, COLON, HX OF   History   Social History  . Marital Status: Married    Spouse Name: N/A    Number of Children: 0  . Years of Education: N/A   Occupational History  . disabled    Social History Main Topics  . Smoking status: Former Smoker    Quit date: 05/08/2003  . Smokeless tobacco: Never Used  . Alcohol Use: No  . Drug Use: No  . Sexually Active: No   Other Topics Concern  . Not on file   Social History Narrative  . No narrative on file    Objective:   Physical Exam well-developed older white female with significant kyphosis, she ambulates with a walker. Blood pressure 130/72 pulse 80 height 5 foot 6 weight 165. HEENT; nontraumatic normocephalic EOMI PERRLA sclera anicteric, Neck;Supple no JVD, Cardiovascular; regular rate and rhythm with S1-S2 no murmur or gallop, Pulmonary; clear bilaterally with significant kyphosis. Abdomen; slightly distended but not tympanitic bowel sounds are present was no tenderness no palpable mass or hepatosplenomegaly, Rectal ;exam small amount of stool in the rectal vault no impaction Hemoccult-negative, Extremities; no clubbing cyanosis or edema skin warm and dry, Psych; mood and affect appropriate        Assessment & Plan:  #75 66 year old female with multiple medical problems as outlined above with chronic  constipation, and intermittent episodes of more acute obstipation. #2 history of adenomatous colon polyp on colonoscopy 03/14/2012-will be due for followup, and may require more extensive bowel prep   Plan; patient was given more samples o fLinzess  145 mcg one daily in the morning She will take a Su prep to purge her bowels over the next 24-48 hours and then resume the Linzess;1 daily  Plan followup office visit with Dr. Marina Goodell in 4- 5 weeks.

## 2011-12-20 ENCOUNTER — Telehealth: Payer: Self-pay | Admitting: Physician Assistant

## 2011-12-20 ENCOUNTER — Telehealth: Payer: Self-pay | Admitting: *Deleted

## 2011-12-20 NOTE — Telephone Encounter (Signed)
pt saw Selinda Orion on 12-19-11

## 2011-12-20 NOTE — Telephone Encounter (Signed)
Called [name] who takes care of [name].  [name] did one dose of the prep this AM and Victorino Dike asked when she could do the other dose.  I explained that we dont have to follow strict exact instruction like when someone is have a colonoscopy.  We are simply trying to purge her bowels.  Since she did one dose this AM, she could do the next dose this evening.  Victorino Dike thanked me for calling back.

## 2011-12-20 NOTE — Telephone Encounter (Signed)
Spoke with patient and she states the nursing home where she lives did have some confusion about how to do her Su prep. Explained to procedure to her as per instructions. She thinks they did it correctly

## 2011-12-27 ENCOUNTER — Telehealth: Payer: Self-pay | Admitting: Internal Medicine

## 2011-12-27 NOTE — Telephone Encounter (Signed)
Pt states her bowels are not working. She took suprep and is taking linzess daily. Requesting to be seen. Pt scheduled to see Willette Cluster NP Monday at 10:30am. Retirement home aware of appt date and time.

## 2011-12-31 ENCOUNTER — Ambulatory Visit (INDEPENDENT_AMBULATORY_CARE_PROVIDER_SITE_OTHER): Payer: Medicare Other | Admitting: Nurse Practitioner

## 2011-12-31 ENCOUNTER — Other Ambulatory Visit (INDEPENDENT_AMBULATORY_CARE_PROVIDER_SITE_OTHER): Payer: Medicare Other

## 2011-12-31 ENCOUNTER — Encounter: Payer: Self-pay | Admitting: Nurse Practitioner

## 2011-12-31 ENCOUNTER — Telehealth: Payer: Self-pay | Admitting: *Deleted

## 2011-12-31 VITALS — BP 140/80 | HR 66 | Ht 59.5 in | Wt 165.0 lb

## 2011-12-31 DIAGNOSIS — R143 Flatulence: Secondary | ICD-10-CM

## 2011-12-31 DIAGNOSIS — R140 Abdominal distension (gaseous): Secondary | ICD-10-CM | POA: Insufficient documentation

## 2011-12-31 DIAGNOSIS — K5909 Other constipation: Secondary | ICD-10-CM

## 2011-12-31 LAB — CBC WITH DIFFERENTIAL/PLATELET
Basophils Absolute: 0 10*3/uL (ref 0.0–0.1)
Eosinophils Relative: 2.8 % (ref 0.0–5.0)
HCT: 30.5 % — ABNORMAL LOW (ref 36.0–46.0)
Lymphocytes Relative: 21.1 % (ref 12.0–46.0)
Lymphs Abs: 0.8 10*3/uL (ref 0.7–4.0)
Monocytes Relative: 6.2 % (ref 3.0–12.0)
Neutro Abs: 2.5 10*3/uL (ref 1.4–7.7)
Platelets: 234 10*3/uL (ref 150.0–400.0)
RBC: 3.12 Mil/uL — ABNORMAL LOW (ref 3.87–5.11)
RDW: 15.1 % — ABNORMAL HIGH (ref 11.5–14.6)

## 2011-12-31 LAB — COMPREHENSIVE METABOLIC PANEL
ALT: 22 U/L (ref 0–35)
CO2: 20 mEq/L (ref 19–32)
Calcium: 10 mg/dL (ref 8.4–10.5)
Chloride: 112 mEq/L (ref 96–112)
GFR: 19.17 mL/min — ABNORMAL LOW (ref >60.00)
Glucose, Bld: 93 mg/dL (ref 70–99)
Sodium: 141 mEq/L (ref 135–145)
Total Bilirubin: 0.4 mg/dL (ref 0.3–1.2)
Total Protein: 7.3 g/dL (ref 6.0–8.3)

## 2011-12-31 NOTE — Progress Notes (Signed)
[name] [id number] 06-06-1945   HISTORY OR PRESENT ILLNESS :  Patient is a 66 year old female with multiple medical problems. She is known to [place] for history of adenomatous colon polyps and chronic constipation. Patient has been seen here several times recently for severe constipation. She has finally begun having good bowel movements on current regimen. Today she presents with new onset abdominal "fullness" and bloating, especially after oral intake. No nausea. She feels her abdomen is distended and hard. No nausea.     Current Medications, Allergies, Past Medical History, Past Surgical History, Family History and Social History were reviewed in [employer] record.   PHYSICAL EXAMINATION : General:  Pleasant white  female in no acute distress Head: Normocephalic and atraumatic Eyes:  sclerae anicteric,conjunctive pink. Ears: Normal auditory acuity Neck: Supple, no masses.  Lungs: Clear throughout to auscultation Heart: Regular rate and rhythm Abdomen: Soft, obese,mild-moderate distention, normal bowel sounds.  No masses or hepatomegaly noted. Normal bowel sounds Musculoskeletal: marked deformity of upper back, ?spine abnormality.   Skin: No lesions on visible extremities. No spider nevi Extremities: 1+ BLE edema Neurological: Oriented x 4, grossly nonfocal Cervical Nodes:  No significant cervical adenopathy Psychological:  Alert and cooperative. Normal mood and affect  ASSESSMENT AND PLAN :  1. New onset abdominal pain, bloating, early satiety. Patient is obese and has a marked musculoskeletal abnormality of upper and cannot lay flat so difficult to assess how much true abdominal distention she really has. She isn't having any nausea and has good bowel sounds so doubt obstructive process.  Her weight is same as it was on her 12/19/11 visit her (165 pounds). She has peripheral edema. Will obtain ultrasound to rule out ascites. Will also obtain some basic labs.  Patient will be notified of test results and further recommendations based on those results  2. history of adenomatous colon polyps. Due for surveillance colonoscopy 03/14/2012  3. chronic constipation, finally doing well on current regimen.  4. Multiple medical problems as listed in PMH

## 2011-12-31 NOTE — Telephone Encounter (Signed)
[name] at [facility] of the follow up appointment with Dr. Yancey Flemings on 02-05-2012 at 10:45 Am.

## 2011-12-31 NOTE — Patient Instructions (Addendum)
Please go to the basement level to have your labs drawn.  We have scheduled the Ultrasound at [facility] on the 1st floor. Date is Wed 01-02-2012. Arrive at 8:45 Am Have nothing to eat or drink after midnight. I spoke to [place] to advise her of the date and time at [facility]. Called for your ride [facility] at 11:08 AM.

## 2012-01-01 NOTE — Progress Notes (Signed)
Reviewed and agree with management plan. Khaliyah [employer]. Lynton Crescenzo MD FACG 

## 2012-01-02 ENCOUNTER — Ambulatory Visit (HOSPITAL_COMMUNITY)
Admission: RE | Admit: 2012-01-02 | Discharge: 2012-01-02 | Disposition: A | Discharge status: Home / Self Care | Payer: Medicare Other | Source: Ambulatory Visit | Attending: Nurse Practitioner | Admitting: Nurse Practitioner

## 2012-01-02 DIAGNOSIS — R141 Gas pain: Secondary | ICD-10-CM | POA: Insufficient documentation

## 2012-01-02 DIAGNOSIS — R142 Eructation: Secondary | ICD-10-CM | POA: Insufficient documentation

## 2012-01-02 DIAGNOSIS — Z9089 Acquired absence of other organs: Secondary | ICD-10-CM | POA: Insufficient documentation

## 2012-01-02 DIAGNOSIS — N200 Calculus of kidney: Secondary | ICD-10-CM | POA: Insufficient documentation

## 2012-01-02 DIAGNOSIS — Q619 Cystic kidney disease, unspecified: Secondary | ICD-10-CM | POA: Insufficient documentation

## 2012-01-02 DIAGNOSIS — R140 Abdominal distension (gaseous): Secondary | ICD-10-CM

## 2012-01-23 ENCOUNTER — Telehealth: Payer: Self-pay | Admitting: Internal Medicine

## 2012-01-23 NOTE — Telephone Encounter (Signed)
Pt states she is having problems with constipation. Pt used an enema Sunday but only a little liquid came out. Requesting to be seen before scheduled appt. Pt scheduled to see Willette Cluster NP Friday 01/25/12@10 :Sula Soda aware of appt date and time.

## 2012-01-25 ENCOUNTER — Ambulatory Visit (INDEPENDENT_AMBULATORY_CARE_PROVIDER_SITE_OTHER): Payer: Medicare Other | Admitting: Physician Assistant

## 2012-01-25 ENCOUNTER — Encounter: Payer: Self-pay | Admitting: Physician Assistant

## 2012-01-25 VITALS — BP 142/88 | HR 66 | Ht 59.0 in | Wt 161.0 lb

## 2012-01-25 DIAGNOSIS — K5909 Other constipation: Secondary | ICD-10-CM

## 2012-01-25 DIAGNOSIS — R109 Unspecified abdominal pain: Secondary | ICD-10-CM

## 2012-01-25 DIAGNOSIS — K625 Hemorrhage of anus and rectum: Secondary | ICD-10-CM

## 2012-01-25 DIAGNOSIS — K5900 Constipation, unspecified: Secondary | ICD-10-CM

## 2012-01-25 MED ORDER — LINACLOTIDE 145 MCG PO CAPS: 145.0000 ug | ORAL_CAPSULE | Freq: Every day | ORAL | Status: DC

## 2012-01-25 NOTE — Progress Notes (Signed)
Subjective:    Patient ID: [name], female    DOB: 02/04/1946, 66 y.o.   MRN: [id number]  HPI [name] is a 66 year old white female who carries diagnoses of bipolar disorder, schizophrenia and lives in an assisted living facility. From a GI standpoint she has history of diverticulosis, adenomatous colon polyps and suffers from chronic constipation as well as GERD. Her last office visit was about 3 weeks ago, seen by Willette Cluster NP at that time with complaints of abdominal distention. She had upper abdominal ultrasound done which was negative for ascites she is status post cholecystectomy. She is now scheduled for an upper endoscopy as well. Patient has had chronic problems with constipation and has been seen at a couple of times over the past 6 months for the same. We had started her on limbs S. several months ago she says she does feel like this helps though she still is only having a bowel movement about every 5 days. She says she has had to use enemas about once a week ever since she was 66 years old and is still doing so. She says  this past Sunday she had had not had a bowel movement for several days and gave herself a small enema however she felt like she caused an abrasion or irritation as it was painful with insertion of the enema that she's had some rectal discomfort and scant amount of bleeding off and on since. She also didn't have any results from the enema and is now apparently been 2 weeks without a bowel movement. We have given her Suprep -earlier this summer and she had good results with that. We have tried higher dose of Linzess but she complains of abdominal pain with this and doesn't want to take it.    Review of Systems  Constitutional: Positive for appetite change.  HENT: Negative.   Eyes: Negative.   Respiratory: Negative.   Cardiovascular: Negative.   Gastrointestinal: Positive for abdominal pain, constipation, blood in stool and abdominal distention.  Genitourinary:  Negative.   Musculoskeletal: Negative.   Neurological: Negative.   Hematological: Negative.   Psychiatric/Behavioral: Negative.    Outpatient Prescriptions Prior to Visit  Medication Sig Dispense Refill  . acetaminophen (TYLENOL) 500 MG tablet Take 1,000 mg by mouth every 4 (four) hours as needed. For pain      . ALPRAZolam (XANAX) 1 MG tablet Take 1 mg by mouth 3 (three) times daily.       Marland Kitchen ALPRAZolam (XANAX) 1 MG tablet Take 1 mg by mouth at bedtime as needed. For sleep      . amLODipine (NORVASC) 10 MG tablet Take 10 mg by mouth daily.        . cetirizine (ZYRTEC) 10 MG tablet Take 10 mg by mouth daily.        Marland Kitchen docusate sodium (COLACE) 100 MG capsule 200-300 mg 2 (two) times daily. Take 3 capsules by mouth every morning and 2 capsules by mouth at 4 pm      . famotidine (PEPCID) 20 MG tablet Take 20 mg by mouth 2 (two) times daily.        . fluticasone (FLONASE) 50 MCG/ACT nasal spray Place 1-2 sprays into the nose daily.       Marland Kitchen labetalol (NORMODYNE) 200 MG tablet Take 200 mg by mouth 2 (two) times daily.        Marland Kitchen lamoTRIgine (LAMICTAL) 200 MG tablet Take 200 mg by mouth 2 (two) times daily.       Marland Kitchen  levothyroxine (SYNTHROID, LEVOTHROID) 50 MCG tablet Take 50 mcg by mouth daily.        . meclizine (ANTIVERT) 25 MG tablet Take 12.5-25 mg by mouth 4 (four) times daily as needed. For dizziness      . methylcellulose oral powder Take by mouth daily.      . nitrofurantoin (MACRODANTIN) 50 MG capsule Take 50 mg by mouth at bedtime. Pt takes daily      . omega-3 acid ethyl esters (LOVAZA) 1 G capsule Take 4 g by mouth daily.       . Oxcarbazepine (TRILEPTAL) 300 MG tablet Take 600-900 mg by mouth 2 (two) times daily. Take two tablets by mouth each morning and three at bedtime      . risperiDONE (RISPERDAL) 2 MG tablet Take 4 mg by mouth 2 (two) times daily.       . Sodium Phosphates (FLEET ENEMA RE) Place 1 each rectally daily as needed. For constipation      . traMADol (ULTRAM) 50 MG tablet  Take 50 mg by mouth. Take one pill twice a day      . Linaclotide (LINZESS) 145 MCG CAPS Take 1 capsule (145 mcg total) by mouth daily.  30 capsule  0  . dextromethorphan-guaiFENesin (TUSSIN DM) 10-100 MG/5ML liquid Take 10-15 mLs by mouth every 4 (four) hours as needed. COUGH      . fluconazole (DIFLUCAN) 150 MG tablet Take 150 mg by mouth once.      . hydrocortisone (ANUSOL-HC) 25 MG suppository Place 1 suppository (25 mg total) rectally at bedtime.  7 suppository  1   No Known Allergies Patient Active Problem List  Diagnosis  . TUBULOVILLOUS ADENOMA, COLON  . HYPOTHYROIDISM  . MIXED HYPERLIPIDEMIA  . ANEMIA, IRON DEFICIENCY  . BPLR I, DPRSD, MOST RECENT EPSD, MANIC, SVR  . ANXIETY  . DEPRESSION  . PROPTOSIS  . HYPERTENSION  . EMPHYSEMA  . GERD  . DIVERTICULOSIS OF COLON  . CONSTIPATION, CHRONIC  . RENAL INSUFFICIENCY, CHRONIC  . RENAL FAILURE  . DEGENERATIVE JOINT DISEASE  . OSTEOARTHRITIS, HIP, RIGHT  . DEGENERATIVE JOINT DISEASE, SPINE  . VERTIGO  . CHEST PAIN  . Abdominal distention   History   Social History  . Marital Status: Married    Spouse Name: N/A    Number of Children: 0  . Years of Education: N/A   Occupational History  . disabled    Social History Main Topics  . Smoking status: Former Smoker    Quit date: 05/08/2003  . Smokeless tobacco: Never Used  . Alcohol Use: No  . Drug Use: No  . Sexually Active: No   Other Topics Concern  . Not on file   Social History Narrative  . No narrative on file       Objective:   Physical Exam well-developed older white female very kyphotic. Blood pressure 142/88 pulse 66 height 4 foot 11 inches weight 161. HEENT; nontraumatic normocephalic EOMI PERRLA sclera anicteric,Neck; Supple no JVD, Cardiovascular; regular rate and rhythm with S1-S2 no murmur or gallop, Pulmonary; clear bilaterally, Abdomen; a protuberant but soft no focal tenderness no guarding no rebound no palpable mass or hepatosplenomegaly, Rectal  ;exam nontender on exam no impaction soft brown stool in the rectal vault Hemoccult negative no external lesion noted, Extremities; no clubbing cyanosis or edema, psych; affect odd, but she is appropriate and pleasant as usual.        Assessment & Plan:  #58 66 year old female with chronic constipation  presenting with no bowel movement x2 weeks despite daily use of linzess. She is on several psychotropic medicines which are probably adversely affecting her bowel function as well. I suspect she had an abrasion from the enema tip of the stool is currently Hemoccult negative she is really nontender on exam so I'm not concerned that she has had any significant rectal injury etc. #2 history of adenomatous colon polyps and diverticulosis, last colonoscopy 2008 do for followup #3 chronic GERD #4 severe kyphosis  Plan; we discussed increasing her dose of linzess however she is hesitant to do that so we'll continue 145 mcg daily I reassured her that she did not have any significant rectal injury or any evidence of bleeding at this time and also that she is not impacted Will purge bowel with Su prep over the weekend. Patient has a followup appointment with Dr. Marina Goodell on October 1, she will keep that and at that time needs to be scheduled for followup colonoscopy

## 2012-01-25 NOTE — Patient Instructions (Signed)
Keep your appointment with Dr. Marina Goodell on 02-05-2012 at 10:45 Am.  We have given you a sample of the Suprep. Follow instructions on the outside of the box. We have printed a new prescription for the Linzess. 145 mcg.

## 2012-01-25 NOTE — Progress Notes (Signed)
Agree with Ms. Esterwood's assessment and plan. Carl E. Gessner, MD, FACG   

## 2012-01-28 ENCOUNTER — Telehealth: Payer: Self-pay | Admitting: Internal Medicine

## 2012-01-28 NOTE — Telephone Encounter (Signed)
Spoke with Lorin Picket at the retirement center and he is aware.

## 2012-01-28 NOTE — Telephone Encounter (Signed)
We cannot provide Supprette chronically. It may result in electrolyte disturbances. She can use MiraLax and titrate as needed

## 2012-01-28 NOTE — Telephone Encounter (Signed)
Pt seen by Mike Gip PA 01/25/12 c/o constipation. Pt was given SuPrep and it worked well per pt. Pt is calling this morning requesting that we call in a script for SuPrep, she has not had a bowel movement in 2 days. Dr. Marina Goodell please advise.

## 2012-01-31 ENCOUNTER — Other Ambulatory Visit: Payer: Self-pay | Admitting: Internal Medicine

## 2012-01-31 ENCOUNTER — Telehealth: Payer: Self-pay | Admitting: Internal Medicine

## 2012-01-31 MED ORDER — POLYETHYLENE GLYCOL 3350 17 GM/SCOOP PO POWD: ORAL | Status: DC

## 2012-01-31 NOTE — Telephone Encounter (Signed)
Rx entered into epic and faxed to Tammy at requested number.

## 2012-02-05 ENCOUNTER — Ambulatory Visit: Payer: Medicare Other | Admitting: Internal Medicine

## 2012-02-06 ENCOUNTER — Other Ambulatory Visit: Payer: Self-pay | Admitting: Gastroenterology

## 2012-02-06 ENCOUNTER — Telehealth: Payer: Self-pay | Admitting: Internal Medicine

## 2012-02-06 NOTE — Telephone Encounter (Signed)
Pt is still complaining of constipation. Pt thinks MOM may help. Gave order for MOM prn daily for constipation. Retirement home to fax order over for signature.

## 2012-02-08 ENCOUNTER — Encounter (HOSPITAL_COMMUNITY): Payer: Self-pay | Admitting: Emergency Medicine

## 2012-02-08 ENCOUNTER — Emergency Department (HOSPITAL_COMMUNITY)
Admission: EM | Admit: 2012-02-08 | Discharge: 2012-02-08 | Disposition: A | Discharge status: Home / Self Care | Payer: Medicare Other | Attending: Emergency Medicine | Admitting: Emergency Medicine

## 2012-02-08 DIAGNOSIS — E039 Hypothyroidism, unspecified: Secondary | ICD-10-CM | POA: Insufficient documentation

## 2012-02-08 DIAGNOSIS — M1990 Unspecified osteoarthritis, unspecified site: Secondary | ICD-10-CM | POA: Insufficient documentation

## 2012-02-08 DIAGNOSIS — Z79899 Other long term (current) drug therapy: Secondary | ICD-10-CM | POA: Insufficient documentation

## 2012-02-08 DIAGNOSIS — K5900 Constipation, unspecified: Secondary | ICD-10-CM | POA: Insufficient documentation

## 2012-02-08 DIAGNOSIS — I10 Essential (primary) hypertension: Secondary | ICD-10-CM | POA: Insufficient documentation

## 2012-02-08 DIAGNOSIS — Z87891 Personal history of nicotine dependence: Secondary | ICD-10-CM | POA: Insufficient documentation

## 2012-02-08 DIAGNOSIS — N289 Disorder of kidney and ureter, unspecified: Secondary | ICD-10-CM | POA: Insufficient documentation

## 2012-02-08 DIAGNOSIS — K219 Gastro-esophageal reflux disease without esophagitis: Secondary | ICD-10-CM | POA: Insufficient documentation

## 2012-02-08 DIAGNOSIS — M109 Gout, unspecified: Secondary | ICD-10-CM | POA: Insufficient documentation

## 2012-02-08 DIAGNOSIS — F319 Bipolar disorder, unspecified: Secondary | ICD-10-CM | POA: Insufficient documentation

## 2012-02-08 NOTE — ED Provider Notes (Signed)
History     CSN: [id number]  Arrival date & time 02/08/12  1455   First MD Initiated Contact with Patient 02/08/12 1742      Chief Complaint  Patient presents with  . Constipation    (Consider location/radiation/quality/duration/timing/severity/associated sxs/prior treatment) The history is provided by the patient.  DREMA STAUDT is a 66 y.o. female hx of constipation, bipolar, HTN here with constipation. Constipation x 2 weeks. She tried enemas, polyethylene glycol, docusate without relief. She took milk of magnesia yesterday and today. No abdominal pain and was eating well. No vomiting. When she arrive in the ED, she had several bowel movements and now feels better. Wants to go back to assisted living. Hx of appendicitis in the past and no hx of SBO.    Past Medical History  Diagnosis Date  . Constipation   . Bipolar 1 disorder   . Hypertension   . Esophageal reflux   . Anxiety   . Back pain   . Osteoarthritis   . Gout   . Adenomatous polyps 03-27-07  . Diverticulosis   . Internal hemorrhoids   . Hypothyroidism   . Renal insufficiency     Past Surgical History  Procedure Date  . Cholecystectomy   . Appendectomy   . Tonsillectomy   . Tubal ligation     Family History  Problem Relation Age of Onset  . Hypertension Mother   . Atrial fibrillation Father   . Colon cancer Neg Hx   . Heart disease Mother     History  Substance Use Topics  . Smoking status: Former Smoker    Quit date: 05/08/2003  . Smokeless tobacco: Never Used  . Alcohol Use: No    OB History    Grav Para Term Preterm Abortions TAB SAB Ect Mult Living                  Review of Systems  Gastrointestinal: Positive for constipation.  All other systems reviewed and are negative.    Allergies  Review of patient's allergies indicates no known allergies.  Home Medications   Current Outpatient Rx  Name Route Sig Dispense Refill  . ACETAMINOPHEN 500 MG PO TABS Oral Take 1,000 mg by  mouth every 6 (six) hours as needed. For pain.    Marland Kitchen ALPRAZOLAM 1 MG PO TABS Oral Take 1 mg by mouth 3 (three) times daily.     Marland Kitchen ALPRAZOLAM 1 MG PO TABS Oral Take 1 mg by mouth at bedtime as needed. For sleep    . AMLODIPINE BESYLATE 10 MG PO TABS Oral Take 10 mg by mouth daily.      Marland Kitchen CETIRIZINE HCL 10 MG PO TABS Oral Take 10 mg by mouth daily as needed. For allergies.    . DOCUSATE SODIUM 100 MG PO CAPS Oral Take 200-300 mg by mouth 2 (two) times daily. 3 caps in am, 2 caps in pm    . FAMOTIDINE 20 MG PO TABS Oral Take 20 mg by mouth 2 (two) times daily.      Marland Kitchen LABETALOL HCL 200 MG PO TABS Oral Take 200 mg by mouth 2 (two) times daily.      Marland Kitchen LAMOTRIGINE 200 MG PO TABS Oral Take 200 mg by mouth 2 (two) times daily.     Marland Kitchen LEVOTHYROXINE SODIUM 50 MCG PO TABS Oral Take 50 mcg by mouth daily.      . LUBIPROSTONE 24 MCG PO CAPS Oral Take 24 mcg by mouth 2 (two)  times daily with a meal.    . MAGNESIUM HYDROXIDE 400 MG/5ML PO SUSP Oral Take 30-60 mLs by mouth daily as needed. For constipation.    . OMEGA-3-ACID ETHYL ESTERS 1 G PO CAPS Oral Take 4 g by mouth daily.     Marland Kitchen OXCARBAZEPINE 300 MG PO TABS Oral Take 900 mg by mouth 2 (two) times daily.     Marland Kitchen POLYETHYLENE GLYCOL 3350 PO PACK Oral Take 17 g by mouth daily.    Marland Kitchen RISPERIDONE 2 MG PO TABS Oral Take 4 mg by mouth 2 (two) times daily.     . DISPOSABLE ENEMA 19-7 GM/118ML RE ENEM Rectal Place 1 enema rectally as needed. For constipation.    . TRAMADOL HCL 50 MG PO TABS Oral Take 50 mg by mouth 2 (two) times daily.       BP 115/51  Pulse 54  Temp 97.8 F (36.6 C) (Oral)  Resp 17  SpO2 94%  Physical Exam  Nursing note and vitals reviewed. Constitutional: She is oriented to person, place, and time. She appears well-developed and well-nourished.       Calm, NAD  HENT:  Head: Normocephalic and atraumatic.  Mouth/Throat: Oropharynx is clear and moist.  Eyes: Conjunctivae normal are normal. Pupils are equal, round, and reactive to light.    Neck: Normal range of motion. Neck supple.  Cardiovascular: Normal rate, regular rhythm and normal heart sounds.   Pulmonary/Chest: Effort normal and breath sounds normal.  Abdominal: Bowel sounds are normal.       Mild distension and bloating, no abdominal tenderness. Rectal- some diarrhea no stool impaction.   Musculoskeletal: Normal range of motion.  Neurological: She is alert and oriented to person, place, and time.  Skin: Skin is warm and dry.  Psychiatric: She has a normal mood and affect. Her behavior is normal. Judgment and thought content normal.    ED Course  Procedures (including critical care time)  Labs Reviewed - No data to display No results found.   1. Constipation       MDM  BRITTNY SPANGLE is a 66 y.o. female here with constipation that resolved. I counseled her of getting an abdominal xray to assess the amount of stool and possible early SBO but she refused. She just wants to go back. She has no vomiting or abdominal pain to suggest SBO at this point. I told her to continue the milk of magnesia and her other meds for constipation.          Richardean Canal, MD 02/08/12 1806

## 2012-02-08 NOTE — ED Notes (Signed)
[id number]<NF> Expected date:02/08/12<BR> Expected time:<BR> Means of arrival:<BR> Comments:<BR> 62&#39;s from Nursing home constipation.

## 2012-02-08 NOTE — ED Notes (Signed)
Report called to [facility] spoke with Malka So [employer] scientist. Ptar called pt awaiting transportation

## 2012-02-08 NOTE — ED Notes (Signed)
Per EMS, pt from [facility].  Has chronic constipation, self administers enemas almost daily, takes polyethylene glycol, citrucel, docusate, amitiza daily, took milk of magnesia yesterday and today. Denies abd pain or any new c/o.

## 2012-02-08 NOTE — ED Notes (Signed)
Awaiting [facility] for transportation back to facility.

## 2012-02-10 ENCOUNTER — Encounter (HOSPITAL_COMMUNITY): Payer: Self-pay | Admitting: Emergency Medicine

## 2012-02-10 ENCOUNTER — Emergency Department (HOSPITAL_COMMUNITY)
Admission: EM | Admit: 2012-02-10 | Discharge: 2012-02-10 | Disposition: A | Discharge status: Home / Self Care | Payer: Medicare Other | Attending: Emergency Medicine | Admitting: Emergency Medicine

## 2012-02-10 DIAGNOSIS — I10 Essential (primary) hypertension: Secondary | ICD-10-CM | POA: Insufficient documentation

## 2012-02-10 DIAGNOSIS — K219 Gastro-esophageal reflux disease without esophagitis: Secondary | ICD-10-CM | POA: Insufficient documentation

## 2012-02-10 DIAGNOSIS — Z87891 Personal history of nicotine dependence: Secondary | ICD-10-CM | POA: Insufficient documentation

## 2012-02-10 DIAGNOSIS — M109 Gout, unspecified: Secondary | ICD-10-CM | POA: Insufficient documentation

## 2012-02-10 DIAGNOSIS — M1990 Unspecified osteoarthritis, unspecified site: Secondary | ICD-10-CM | POA: Insufficient documentation

## 2012-02-10 DIAGNOSIS — F411 Generalized anxiety disorder: Secondary | ICD-10-CM | POA: Insufficient documentation

## 2012-02-10 DIAGNOSIS — F319 Bipolar disorder, unspecified: Secondary | ICD-10-CM

## 2012-02-10 DIAGNOSIS — E039 Hypothyroidism, unspecified: Secondary | ICD-10-CM | POA: Insufficient documentation

## 2012-02-10 NOTE — ED Notes (Signed)
[id number]<AV> Expected date:02/10/12<BR> Expected time:12:29 PM<BR> Means of arrival:Ambulance<BR> Comments:<BR> HTN psychotic

## 2012-02-10 NOTE — ED Notes (Signed)
[facility] called  

## 2012-02-10 NOTE — ED Notes (Signed)
Per EMS report nursing staff stated patient was experiencing psychosis and given the following examples that was stated yelling take me to the hospital, they are overdosing me on my Risperdal, and they are going to kill me. General appearance is a patient conscious and alert, quiet. No acute distress, cooperative.

## 2012-02-10 NOTE — ED Provider Notes (Signed)
History     CSN: [id number]  Arrival date & time 02/10/12  1235   First MD Initiated Contact with Patient 02/10/12 1319      Chief Complaint  Patient presents with  . Hypertension  . Psychiatric Evaluation    The history is provided by the patient (Nurse at the assisted living facility).   the patient reports she's concerned that the staff at her sister living facility or overdosing her on her Risperdal.  She call 911 to come to the emergency department.  I discussed her case with the nurse at the assisted living Center who reports that recently her Risperdal was increased from 2 mg twice a day to 4 mg twice a day by her physician for her bipolar disorder.  The nurse reports that this type of behavior is very common for the patient at the nurse herself is not concerned about any new medical or psychiatric pathology.  The nurse tried to talk the patient had of coming to the emergency department but unbeknownst to the nurse the patient called on herself on to 911.  The patient this time has no complaint.  She denies chest pain shortness of breath.  She has no abdominal pain.  No nausea or vomiting.  Past Medical History  Diagnosis Date  . Constipation   . Bipolar 1 disorder   . Hypertension   . Esophageal reflux   . Anxiety   . Back pain   . Osteoarthritis   . Gout   . Adenomatous polyps 03-27-07  . Diverticulosis   . Internal hemorrhoids   . Hypothyroidism   . Renal insufficiency     Past Surgical History  Procedure Date  . Cholecystectomy   . Appendectomy   . Tonsillectomy   . Tubal ligation     Family History  Problem Relation Age of Onset  . Hypertension Mother   . Atrial fibrillation Father   . Colon cancer Neg Hx   . Heart disease Mother     History  Substance Use Topics  . Smoking status: Former Smoker    Quit date: 05/08/2003  . Smokeless tobacco: Never Used  . Alcohol Use: No    OB History    Grav Para Term Preterm Abortions TAB SAB Ect Mult Living                  Review of Systems  All other systems reviewed and are negative.    Allergies  Review of patient's allergies indicates no known allergies.  Home Medications   Current Outpatient Rx  Name Route Sig Dispense Refill  . ACETAMINOPHEN 500 MG PO TABS Oral Take 1,000 mg by mouth every 6 (six) hours as needed. For pain.    Marland Kitchen ALPRAZOLAM 1 MG PO TABS Oral Take 1 mg by mouth 3 (three) times daily.     Marland Kitchen ALPRAZOLAM 1 MG PO TABS Oral Take 1 mg by mouth at bedtime as needed. For sleep    . AMLODIPINE BESYLATE 10 MG PO TABS Oral Take 10 mg by mouth daily.      Marland Kitchen CETIRIZINE HCL 10 MG PO TABS Oral Take 10 mg by mouth daily as needed. For allergies.    . DOCUSATE SODIUM 100 MG PO CAPS Oral Take 200-300 mg by mouth 2 (two) times daily. 3 caps in am, 2 caps in pm    . FAMOTIDINE 20 MG PO TABS Oral Take 20 mg by mouth 2 (two) times daily.      Marland Kitchen  LABETALOL HCL 200 MG PO TABS Oral Take 200 mg by mouth 2 (two) times daily.      Marland Kitchen LAMOTRIGINE 200 MG PO TABS Oral Take 200 mg by mouth 2 (two) times daily.     Marland Kitchen LEVOTHYROXINE SODIUM 50 MCG PO TABS Oral Take 50 mcg by mouth daily.      . LUBIPROSTONE 24 MCG PO CAPS Oral Take 24 mcg by mouth 2 (two) times daily with a meal.    . MAGNESIUM HYDROXIDE 400 MG/5ML PO SUSP Oral Take 30-60 mLs by mouth daily as needed. For constipation.    . OMEGA-3-ACID ETHYL ESTERS 1 G PO CAPS Oral Take 4 g by mouth daily.     Marland Kitchen OXCARBAZEPINE 300 MG PO TABS Oral Take 900 mg by mouth 2 (two) times daily.     Marland Kitchen POLYETHYLENE GLYCOL 3350 PO PACK Oral Take 17 g by mouth daily.    Marland Kitchen RISPERIDONE 2 MG PO TABS Oral Take 4 mg by mouth 2 (two) times daily.     . DISPOSABLE ENEMA 19-7 GM/118ML RE ENEM Rectal Place 1 enema rectally as needed. For constipation.    . TRAMADOL HCL 50 MG PO TABS Oral Take 50 mg by mouth 2 (two) times daily.       BP 179/64  Pulse 62  Temp 97.6 F (36.4 C) (Oral)  Resp 20  SpO2 96%  Physical Exam  Nursing note and vitals  reviewed. Constitutional: She is oriented to person, place, and time. She appears well-developed and well-nourished. No distress.  HENT:  Head: Normocephalic and atraumatic.  Eyes: EOM are normal.  Neck: Normal range of motion.  Cardiovascular: Normal rate, regular rhythm and normal heart sounds.   Pulmonary/Chest: Effort normal and breath sounds normal.  Abdominal: Soft. She exhibits no distension. There is no tenderness.  Musculoskeletal: Normal range of motion.  Neurological: She is alert and oriented to person, place, and time.  Skin: Skin is warm and dry.  Psychiatric: She has a normal mood and affect. Judgment normal.    ED Course  Procedures (including critical care time)  Labs Reviewed - No data to display No results found.   1. Bipolar disorder       MDM  The patient has no acute psychiatric illness.  This appears to be baseline bipolar disorder.  I spoke with the nurse at her facility he states this is normal behavior for the patient and the nurse has no concerns at all.  She tried to talk the patient is calling now wanting coming to the ER.  She recently had a respiratory increased from 2 mg twice a day to 4 mg twice a day this was changed for several days ago.  Her vital signs are normal.  Discharge back to their facility        Lyanne Co, MD 02/10/12 1341

## 2012-02-14 ENCOUNTER — Telehealth: Payer: Self-pay | Admitting: Internal Medicine

## 2012-02-14 NOTE — Telephone Encounter (Signed)
Pt called and states that she is taking amitiza and that her BM's are not coming out of her bottom, they are coming out of her vagina. Discussed with pt that she should see her gyn doctor for this. Called assisted living facility and spoke with her caregiver Tammy. Per Tammy the pt is fine and her bipolar disorder is in full swing. Pt has refused to take her medication for the past 2-3 days. Pt called 911 and went to the ER thinking they are giving her to much medicine. Tammy states this is normal behavior for the pt.

## 2012-02-22 ENCOUNTER — Ambulatory Visit (INDEPENDENT_AMBULATORY_CARE_PROVIDER_SITE_OTHER): Payer: Medicare Other | Admitting: Internal Medicine

## 2012-02-22 ENCOUNTER — Encounter: Payer: Self-pay | Admitting: Internal Medicine

## 2012-02-22 VITALS — BP 120/72 | HR 58 | Ht 59.0 in

## 2012-02-22 DIAGNOSIS — K5900 Constipation, unspecified: Secondary | ICD-10-CM

## 2012-02-22 DIAGNOSIS — Z860100 Personal history of colon polyps, unspecified: Secondary | ICD-10-CM

## 2012-02-22 DIAGNOSIS — R1084 Generalized abdominal pain: Secondary | ICD-10-CM

## 2012-02-22 NOTE — Progress Notes (Signed)
HISTORY OF PRESENT ILLNESS:  [name] is a 66 y.o. female with multiple medical problems as listed below. She has been followed in this office intermittently for chronic constipation, GERD, and adenomatous colon polyps. Her biggest problem is bipolar disorder. She has been seen by our extenders on several occasions recently for constipation. Different agents tried with variable success. She continues to complain of either constipation or laxative induced diarrhea. As well intermittent abdominal bloating with discomfort. Things really have not changed over the years. Currently, she is using Amitiza, milk of magnesia, and MiraLax. Amounts may vary. She has been out of she can use Dulcolax tablets, having had success in the past. She is not having bleeding or any new issues.  REVIEW OF SYSTEMS:  All non-GI ROS negative except for back pain  Past Medical History  Diagnosis Date  . Constipation   . Bipolar 1 disorder   . Hypertension   . Esophageal reflux   . Anxiety   . Back pain   . Osteoarthritis   . Gout   . Adenomatous polyps 03-27-07  . Diverticulosis   . Internal hemorrhoids   . Hypothyroidism   . Renal insufficiency     Past Surgical History  Procedure Date  . Cholecystectomy   . Appendectomy   . Tonsillectomy   . Tubal ligation     Social History [name]  reports that she quit smoking about 8 years ago. She has never used smokeless tobacco. She reports that she does not drink alcohol or use illicit drugs.  family history includes Atrial fibrillation in her father; Heart disease in her mother; and Hypertension in her mother.  There is no history of Colon cancer.  No Known Allergies     PHYSICAL EXAMINATION: Vital signs: BP 120/72  Pulse 58  Ht 4\' 11"  (1.499 m)  SpO2 96% General: chronically ill-appearing, well-nourished, no acute distress. Slurred speech. Kyphosis HEENT: Sclerae are anicteric, conjunctiva pink. Oral mucosa intact Lungs: Clear Heart:  Regular Abdomen: soft,obese, nontender, nondistended, no obvious ascites, no peritoneal signs, normal bowel sounds. No organomegaly. Extremities: No edema Psychiatric: alert and oriented x3. Cooperative    ASSESSMENT: #1. Functional constipation #2. History of adenomatous colon polyps. Last colonoscopy 2008 #3. Bipolar disorder   PLAN: #1. In terms of bowel regimen, whatever works best for the patient. Unfortunately, with her psychiatric illness, this can vary greatly. She wants to try Dulcolax tablets, that's fine. #2. Surveillance colonoscopy. She is interested. No contraindication. This will need to be performed after the new year, in the hospital outpatient area #3. Return to the care of Dr. Andrey Campanile

## 2012-02-22 NOTE — Patient Instructions (Addendum)
We will call you after the first of the year to set up a colonoscopy.

## 2012-02-29 ENCOUNTER — Other Ambulatory Visit: Payer: Self-pay | Admitting: Internal Medicine

## 2012-03-03 ENCOUNTER — Other Ambulatory Visit: Payer: Self-pay

## 2012-03-03 MED ORDER — MAGNESIUM HYDROXIDE 400 MG/5ML PO SUSP: 30.0000 mL | Freq: Every day | ORAL | Status: DC | PRN

## 2012-03-05 ENCOUNTER — Other Ambulatory Visit: Payer: Self-pay | Admitting: Gastroenterology

## 2012-03-31 ENCOUNTER — Other Ambulatory Visit: Payer: Self-pay | Admitting: Internal Medicine

## 2012-05-23 ENCOUNTER — Emergency Department (HOSPITAL_BASED_OUTPATIENT_CLINIC_OR_DEPARTMENT_OTHER)
Admission: EM | Admit: 2012-05-23 | Discharge: 2012-05-23 | Disposition: A | Discharge status: Skilled Nursing Facility | Payer: Medicare Other | Attending: Emergency Medicine | Admitting: Emergency Medicine

## 2012-05-23 ENCOUNTER — Encounter (HOSPITAL_BASED_OUTPATIENT_CLINIC_OR_DEPARTMENT_OTHER): Payer: Self-pay

## 2012-05-23 ENCOUNTER — Emergency Department (HOSPITAL_BASED_OUTPATIENT_CLINIC_OR_DEPARTMENT_OTHER): Payer: Medicare Other

## 2012-05-23 DIAGNOSIS — F319 Bipolar disorder, unspecified: Secondary | ICD-10-CM | POA: Insufficient documentation

## 2012-05-23 DIAGNOSIS — F411 Generalized anxiety disorder: Secondary | ICD-10-CM | POA: Insufficient documentation

## 2012-05-23 DIAGNOSIS — Z79899 Other long term (current) drug therapy: Secondary | ICD-10-CM | POA: Insufficient documentation

## 2012-05-23 DIAGNOSIS — Y9210 Unspecified residential institution as the place of occurrence of the external cause: Secondary | ICD-10-CM | POA: Insufficient documentation

## 2012-05-23 DIAGNOSIS — I129 Hypertensive chronic kidney disease with stage 1 through stage 4 chronic kidney disease, or unspecified chronic kidney disease: Secondary | ICD-10-CM | POA: Insufficient documentation

## 2012-05-23 DIAGNOSIS — Z87891 Personal history of nicotine dependence: Secondary | ICD-10-CM | POA: Insufficient documentation

## 2012-05-23 DIAGNOSIS — S39012A Strain of muscle, fascia and tendon of lower back, initial encounter: Secondary | ICD-10-CM

## 2012-05-23 DIAGNOSIS — Z8639 Personal history of other endocrine, nutritional and metabolic disease: Secondary | ICD-10-CM | POA: Insufficient documentation

## 2012-05-23 DIAGNOSIS — Z8719 Personal history of other diseases of the digestive system: Secondary | ICD-10-CM | POA: Insufficient documentation

## 2012-05-23 DIAGNOSIS — W06XXXA Fall from bed, initial encounter: Secondary | ICD-10-CM | POA: Insufficient documentation

## 2012-05-23 DIAGNOSIS — Z862 Personal history of diseases of the blood and blood-forming organs and certain disorders involving the immune mechanism: Secondary | ICD-10-CM | POA: Insufficient documentation

## 2012-05-23 DIAGNOSIS — S335XXA Sprain of ligaments of lumbar spine, initial encounter: Secondary | ICD-10-CM | POA: Insufficient documentation

## 2012-05-23 DIAGNOSIS — Y939 Activity, unspecified: Secondary | ICD-10-CM | POA: Insufficient documentation

## 2012-05-23 DIAGNOSIS — Z8739 Personal history of other diseases of the musculoskeletal system and connective tissue: Secondary | ICD-10-CM | POA: Insufficient documentation

## 2012-05-23 DIAGNOSIS — Z860100 Personal history of colon polyps, unspecified: Secondary | ICD-10-CM | POA: Insufficient documentation

## 2012-05-23 DIAGNOSIS — Z8679 Personal history of other diseases of the circulatory system: Secondary | ICD-10-CM | POA: Insufficient documentation

## 2012-05-23 DIAGNOSIS — N289 Disorder of kidney and ureter, unspecified: Secondary | ICD-10-CM | POA: Insufficient documentation

## 2012-05-23 DIAGNOSIS — E039 Hypothyroidism, unspecified: Secondary | ICD-10-CM | POA: Insufficient documentation

## 2012-05-23 MED ORDER — TRAMADOL HCL 50 MG PO TABS: 50.0000 mg | ORAL_TABLET | Freq: Two times a day (BID) | ORAL | Status: DC | PRN

## 2012-05-23 MED ORDER — TRAMADOL HCL 50 MG PO TABS
50.0000 mg | ORAL_TABLET | Freq: Once | ORAL | Status: AC
  Administered 2012-05-23: 50 mg via ORAL
  Filled 2012-05-23: qty 1

## 2012-05-23 NOTE — ED Notes (Signed)
Report given to Deanna Artis, LPN at [facility]

## 2012-05-23 NOTE — ED Provider Notes (Signed)
History     CSN: [id number]  Arrival date & time 05/23/12  7829   First MD Initiated Contact with Patient 05/23/12 [phone]      Chief Complaint  Patient presents with  . Fall    (Consider location/radiation/quality/duration/timing/severity/associated sxs/prior treatment) HPI This is a 67 year old female who lives in assisted living facility. She fell off the end of her bed yesterday afternoon about 12 hours ago. She states she has difficulty with her balance and fell while attempting to stand. After falling she developed pain in the right paralumbar region but decided she'd rather take a nap then seek medical care at that time.  This morning she decided to have her pain looked into. The pain is moderate and worse with movement of the back but not with movement of her legs. She states it is making it somewhat difficult to walk. There is no associated neurologic deficit. She denies other injury.  Past Medical History  Diagnosis Date  . Constipation   . Bipolar 1 disorder   . Hypertension   . Esophageal reflux   . Anxiety   . Back pain   . Osteoarthritis   . Gout   . Adenomatous polyps 03-27-07  . Diverticulosis   . Internal hemorrhoids   . Hypothyroidism   . Renal insufficiency     Past Surgical History  Procedure Date  . Cholecystectomy   . Appendectomy   . Tonsillectomy   . Tubal ligation     Family History  Problem Relation Age of Onset  . Hypertension Mother   . Atrial fibrillation Father   . Colon cancer Neg Hx   . Heart disease Mother     History  Substance Use Topics  . Smoking status: Former Smoker    Quit date: 05/08/2003  . Smokeless tobacco: Never Used  . Alcohol Use: No    OB History    Grav Para Term Preterm Abortions TAB SAB Ect Mult Living                  Review of Systems  All other systems reviewed and are negative.    Allergies  Review of patient's allergies indicates no known allergies.  Home Medications   Current Outpatient Rx   Name  Route  Sig  Dispense  Refill  . ACETAMINOPHEN 500 MG PO TABS   Oral   Take 1,000 mg by mouth every 4 (four) hours as needed. For pain.         Marland Kitchen ALPRAZOLAM 1 MG PO TABS   Oral   Take 1 mg by mouth 3 (three) times daily.          Marland Kitchen ALPRAZOLAM 1 MG PO TABS   Oral   Take 1 mg by mouth at bedtime as needed. For sleep         . AMITIZA 24 MCG PO CAPS      TAKE (1) CAPSULE BY MOUTH TWICE DAILY WITH FOOD.   60 capsule   3   . AMLODIPINE BESYLATE 10 MG PO TABS   Oral   Take 10 mg by mouth daily.           Marland Kitchen CETIRIZINE HCL 10 MG PO TABS   Oral   Take 10 mg by mouth daily as needed. For allergies.         . DOCUSATE SODIUM 100 MG PO CAPS   Oral   Take 200-300 mg by mouth 2 (two) times daily. 3 caps in am, 2  caps in pm         . FAMOTIDINE 20 MG PO TABS   Oral   Take 20 mg by mouth 2 (two) times daily.           Marland Kitchen LABETALOL HCL 200 MG PO TABS   Oral   Take 200 mg by mouth 2 (two) times daily.           Marland Kitchen LAMOTRIGINE 200 MG PO TABS   Oral   Take 200 mg by mouth 2 (two) times daily.          Marland Kitchen LEVOTHYROXINE SODIUM 50 MCG PO TABS   Oral   Take 50 mcg by mouth daily before breakfast.          . MAGNESIUM HYDROXIDE 400 MG/5ML PO SUSP   Oral   Take 30-60 mLs by mouth daily as needed. For constipation.   360 mL   1   . MIRALAX PO POWD      MIX 1 CAPFUL (17G) IN 8 OUNCES OF JUICE/WATER AND DRINK ONCE DAILY.   527 g   2     PATIENT ALSO TAKES AN EXTRA DOSE IF NEEDED.   Marland Kitchen OMEGA-3-ACID ETHYL ESTERS 1 G PO CAPS   Oral   Take 4 g by mouth daily.          Marland Kitchen OXCARBAZEPINE 300 MG PO TABS   Oral   Take 900 mg by mouth 2 (two) times daily.          Marland Kitchen POLYETHYLENE GLYCOL 3350 PO PACK   Oral   Take 17 g by mouth daily.         Marland Kitchen RISPERIDONE 2 MG PO TABS   Oral   Take 4 mg by mouth 2 (two) times daily.          . DISPOSABLE ENEMA 19-7 GM/118ML RE ENEM   Rectal   Place 1 enema rectally as needed. For constipation.         .  TRAMADOL HCL 50 MG PO TABS   Oral   Take 50 mg by mouth 2 (two) times daily.            There were no vitals taken for this visit.  Physical Exam General: Well-developed, well-nourished female in no acute distress; appearance consistent with age of record HENT: normocephalic, atraumatic Eyes: pupils equal round and reactive to light; extraocular muscles intact Neck: supple Heart: regular rate and rhythm Lungs: clear to auscultation bilaterally Abdomen: soft; nondistended; nontender; bowel sounds present Back: Right paralumbar tenderness; no pain on straight leg raise bilaterally Extremities: No deformity; full range of motion; trace edema of lower legs Neurologic: Awake, alert; motor function intact in all extremities and symmetric; no facial droop; choreoathetoid movements of lips Skin: Warm and dry     ED Course  Procedures (including critical care time)     MDM  Nursing notes and vitals signs, including pulse oximetry, reviewed.  Summary of this visit's results, reviewed by myself:  Imaging Studies: Dg Lumbar Spine Complete  05/23/2012  *RADIOLOGY REPORT*  Clinical Data: Low back pain after fall.  LUMBAR SPINE - COMPLETE 4+ VIEW  Comparison: 05/20/2011  Findings: Sedation of the lumbar spine is limited due to bowel gas and stool.  Five lumbar type vertebrae are identified.  There appears to be normal alignment of the lumbar vertebrae and of the facet joints.  Mild anterior compression of the T12 vertebra with degenerative changes at the inferior end plate consistent  with chronic change.  There is progression of degenerative change since the previous study. No focal bone lesion or bone destruction.  Bone cortex and trabecular architecture appear intact.  IMPRESSION: No acute displaced fractures identified.  Mild chronic anterior wedging of T12 with progressive degenerative changes since the previous study, suggesting chronic change.   Original Report Authenticated By: Burman Nieves, M.D.             Hanley Seamen, MD 05/23/12 [phone]

## 2012-05-23 NOTE — ED Notes (Signed)
Patient transported to X-ray 

## 2012-05-23 NOTE — ED Notes (Signed)
[facility] has arrived for discharge transport.  Pt leaving at this time.

## 2012-05-23 NOTE — ED Notes (Signed)
Per [facility], pt was at assisted living facility and fell about 12 hours ago, now complains of R sided pain after awakening from a nap.  Pt states that she was standing at the foot of her bed, lost balance and fell.  Primary complaint of R leg pain.

## 2012-05-23 NOTE — ED Notes (Signed)
Pt returned from radiology, given warm blanket.

## 2012-06-03 ENCOUNTER — Encounter: Payer: Self-pay | Admitting: Internal Medicine

## 2012-06-06 ENCOUNTER — Encounter (HOSPITAL_COMMUNITY): Payer: Self-pay | Admitting: *Deleted

## 2012-06-06 ENCOUNTER — Emergency Department (HOSPITAL_COMMUNITY): Payer: Medicare Other

## 2012-06-06 ENCOUNTER — Emergency Department (HOSPITAL_COMMUNITY)
Admission: EM | Admit: 2012-06-06 | Discharge: 2012-06-07 | Disposition: A | Discharge status: Home / Self Care | Payer: Medicare Other | Attending: Emergency Medicine | Admitting: Emergency Medicine

## 2012-06-06 DIAGNOSIS — Z7982 Long term (current) use of aspirin: Secondary | ICD-10-CM | POA: Insufficient documentation

## 2012-06-06 DIAGNOSIS — I10 Essential (primary) hypertension: Secondary | ICD-10-CM | POA: Insufficient documentation

## 2012-06-06 DIAGNOSIS — Z79899 Other long term (current) drug therapy: Secondary | ICD-10-CM | POA: Insufficient documentation

## 2012-06-06 DIAGNOSIS — F411 Generalized anxiety disorder: Secondary | ICD-10-CM | POA: Insufficient documentation

## 2012-06-06 DIAGNOSIS — Z8719 Personal history of other diseases of the digestive system: Secondary | ICD-10-CM | POA: Insufficient documentation

## 2012-06-06 DIAGNOSIS — K219 Gastro-esophageal reflux disease without esophagitis: Secondary | ICD-10-CM | POA: Insufficient documentation

## 2012-06-06 DIAGNOSIS — F3160 Bipolar disorder, current episode mixed, unspecified: Secondary | ICD-10-CM | POA: Insufficient documentation

## 2012-06-06 DIAGNOSIS — M1990 Unspecified osteoarthritis, unspecified site: Secondary | ICD-10-CM | POA: Insufficient documentation

## 2012-06-06 DIAGNOSIS — Y9389 Activity, other specified: Secondary | ICD-10-CM | POA: Insufficient documentation

## 2012-06-06 DIAGNOSIS — N289 Disorder of kidney and ureter, unspecified: Secondary | ICD-10-CM | POA: Insufficient documentation

## 2012-06-06 DIAGNOSIS — Z87891 Personal history of nicotine dependence: Secondary | ICD-10-CM | POA: Insufficient documentation

## 2012-06-06 DIAGNOSIS — IMO0002 Reserved for concepts with insufficient information to code with codable children: Secondary | ICD-10-CM | POA: Insufficient documentation

## 2012-06-06 DIAGNOSIS — W010XXA Fall on same level from slipping, tripping and stumbling without subsequent striking against object, initial encounter: Secondary | ICD-10-CM | POA: Insufficient documentation

## 2012-06-06 DIAGNOSIS — M545 Low back pain: Secondary | ICD-10-CM

## 2012-06-06 DIAGNOSIS — M109 Gout, unspecified: Secondary | ICD-10-CM | POA: Insufficient documentation

## 2012-06-06 DIAGNOSIS — Y9289 Other specified places as the place of occurrence of the external cause: Secondary | ICD-10-CM | POA: Insufficient documentation

## 2012-06-06 DIAGNOSIS — E039 Hypothyroidism, unspecified: Secondary | ICD-10-CM | POA: Insufficient documentation

## 2012-06-06 DIAGNOSIS — W19XXXA Unspecified fall, initial encounter: Secondary | ICD-10-CM

## 2012-06-06 NOTE — ED Notes (Signed)
Pt states she fell in room around 7pm. Pt now with left sided back pain. Pt is from [facility] on [address].

## 2012-06-06 NOTE — ED Notes (Signed)
ION:[id number]<MW> Expected date:<BR> Expected time:<BR> Means of arrival:<BR> Comments:<BR> PTAR 37, 66 F, Fall/Back Pain

## 2012-06-07 ENCOUNTER — Emergency Department (HOSPITAL_COMMUNITY): Payer: Medicare Other

## 2012-06-07 NOTE — ED Provider Notes (Signed)
History     CSN: [id number]  Arrival date & time 06/06/12  2153   First MD Initiated Contact with Patient 06/06/12 2312      Chief Complaint  Patient presents with  . Fall  . Back Pain     HPI Pt was seen at 2330.   Per pt, c/o sudden onset and resolution of one episode of slip and fall that occurred today approx 1900 at the [facility].  States she slipped and fell while she was trying to pick up clothes.  States she fell backwards to the left, hitting her left ribs and low back "against a table."  Is unsure if she hit her head. Pt has significant hx of frequent falls. Denies LOC, no syncope, no neck pain, no abd pain, no CP/SOB, no focal motor weakness, no tingling/numbness in extremities.    Past Medical History  Diagnosis Date  . Constipation   . Bipolar 1 disorder   . Hypertension   . Esophageal reflux   . Anxiety   . Back pain   . Osteoarthritis   . Gout   . Adenomatous polyps 03-27-07  . Diverticulosis   . Internal hemorrhoids   . Hypothyroidism   . Renal insufficiency     Past Surgical History  Procedure Date  . Cholecystectomy   . Appendectomy   . Tonsillectomy   . Tubal ligation     Family History  Problem Relation Age of Onset  . Hypertension Mother   . Atrial fibrillation Father   . Colon cancer Neg Hx   . Heart disease Mother     History  Substance Use Topics  . Smoking status: Former Smoker    Quit date: 05/08/2003  . Smokeless tobacco: Never Used  . Alcohol Use: No    Review of Systems ROS: Statement: All systems negative except as marked or noted in the HPI; Constitutional: Negative for fever and chills. ; ; Eyes: Negative for eye pain, redness and discharge. ; ; ENMT: Negative for ear pain, hoarseness, nasal congestion, sinus pressure and sore throat. ; ; Cardiovascular: Negative for chest pain, palpitations, diaphoresis, dyspnea and peripheral edema. ; ; Respiratory: Negative for cough, wheezing and stridor. ; ; Gastrointestinal: Negative for  nausea, vomiting, diarrhea, abdominal pain, blood in stool, hematemesis, jaundice and rectal bleeding. . ; ; Genitourinary: Negative for dysuria, flank pain and hematuria. ; ; Musculoskeletal: +left ribs and LBP. Negative for neck pain. Negative for swelling and trauma.; ; Skin: Negative for pruritus, rash, abrasions, blisters, bruising and skin lesion.; ; Neuro: Negative for headache, lightheadedness and neck stiffness. Negative for weakness, altered level of consciousness , altered mental status, extremity weakness, paresthesias, involuntary movement, seizure and syncope.       Allergies  Review of patient's allergies indicates no known allergies.  Home Medications   Current Outpatient Rx  Name  Route  Sig  Dispense  Refill  . ALPRAZOLAM 1 MG PO TABS   Oral   Take 0.5-1 mg by mouth 3 (three) times daily. Scheduled. Give 1 tablet in the morning, 0.5 tablet at lunch, & 1 tablet at 6pm.         . AMITIZA 24 MCG PO CAPS      TAKE (1) CAPSULE BY MOUTH TWICE DAILY WITH FOOD.   60 capsule   3   . AMLODIPINE BESYLATE 10 MG PO TABS   Oral   Take 10 mg by mouth daily.          . ASPIRIN 500  MG PO TABS   Oral   Take 500 mg by mouth every 4 (four) hours as needed. For pain         . DOCUSATE SODIUM 100 MG PO CAPS   Oral   Take 200-300 mg by mouth 2 (two) times daily. 3 caps in am, 2 caps in pm         . FAMOTIDINE 20 MG PO TABS   Oral   Take 20 mg by mouth 2 (two) times daily.           . GUAIFENESIN 100 MG/5ML PO SYRP   Oral   Take 200 mg by mouth 4 (four) times daily as needed. For cough         . LABETALOL HCL 200 MG PO TABS   Oral   Take 200 mg by mouth 2 (two) times daily.           Marland Kitchen LAMOTRIGINE 200 MG PO TABS   Oral   Take 200 mg by mouth 2 (two) times daily.          Marland Kitchen LEVOTHYROXINE SODIUM 50 MCG PO TABS   Oral   Take 50 mcg by mouth daily before breakfast.          . OMEGA-3-ACID ETHYL ESTERS 1 G PO CAPS   Oral   Take 4 g by mouth daily.           Marland Kitchen OXCARBAZEPINE 300 MG PO TABS   Oral   Take 900 mg by mouth 2 (two) times daily.          Marland Kitchen POLYETHYLENE GLYCOL 3350 PO PACK   Oral   Take 17 g by mouth daily.         . PSYLLIUM 95 % PO PACK   Oral   Take 1 packet by mouth 2 (two) times daily.         Marland Kitchen RISPERIDONE 2 MG PO TABS   Oral   Take 4 mg by mouth 2 (two) times daily.          . TRAMADOL HCL 50 MG PO TABS   Oral   Take 1 tablet (50 mg total) by mouth 2 (two) times daily as needed for pain.   30 tablet   0   . ACETAMINOPHEN 500 MG PO TABS   Oral   Take 1,000 mg by mouth every 4 (four) hours as needed. For pain.         Marland Kitchen ALPRAZOLAM 1 MG PO TABS   Oral   Take 0.5 mg by mouth daily as needed. For anxiety or sleep         . CETIRIZINE HCL 10 MG PO TABS   Oral   Take 10 mg by mouth daily as needed. For allergies.         Marland Kitchen MAGNESIUM HYDROXIDE 400 MG/5ML PO SUSP   Oral   Take 30-60 mLs by mouth daily as needed. For constipation.   360 mL   1   . DISPOSABLE ENEMA 19-7 GM/118ML RE ENEM   Rectal   Place 1 enema rectally as needed. For constipation.           BP 152/59  Pulse 59  Temp 97.5 F (36.4 C) (Oral)  Resp 16  SpO2 100%  Physical Exam 2335: Physical examination:  Nursing notes reviewed; Vital signs and O2 SAT reviewed;  Constitutional: Well developed, Well nourished, Well hydrated, In no acute distress; Head:  Normocephalic, atraumatic; Eyes: EOMI,  PERRL, No scleral icterus; ENMT: Mouth and pharynx normal, Mucous membranes moist; Neck: Supple, Full range of motion, No lymphadenopathy; Cardiovascular: Regular rate and rhythm, No gallop; Respiratory: Breath sounds clear & equal bilaterally, No rales, rhonchi, wheezes.  Speaking full sentences with ease, Normal respiratory effort/excursion; Chest: Nontender, Movement normal; Abdomen: Soft, Nontender, Nondistended, Normal bowel sounds; Genitourinary: No CVA tenderness; Spine:  No midline CS, TS, LS tenderness.  +TTP left lumbar  paraspinal muscles.; Extremities: Pulses normal, No tenderness, No edema, No calf edema or asymmetry.; Neuro: AA&Ox3, Major CN grossly intact.  Speech clear. Strength 5/5 equal bilat UE's and LE's, including great toe dorsiflexion.  DTR 2/4 equal bilat UE's and LE's.  No gross sensory deficits.  Neg straight leg raises bilat..; Skin: Color normal, Warm, Dry.   ED Course  Procedures    MDM  MDM Reviewed: previous chart, nursing note and vitals Interpretation: CT scan and x-ray   Dg Ribs Unilateral W/chest Left 06/07/2012  *RADIOLOGY REPORT*  Clinical Data: Left-sided rib pain after fall.  LEFT RIBS AND CHEST - 3+ VIEW  Comparison: Chest 11/29/2009  Findings: Shallow inspiration.  Borderline heart size with normal pulmonary vascularity for technique.  Suggestion of infiltration or atelectasis in the right lung base.  No blunting of costophrenic angles.  No pneumothorax.  Calcified and tortuous aorta.  The left ribs appear intact.  No displaced fractures are appreciated.  No focal bone lesion or expansile changes.  IMPRESSION: Shallow inspiration with infiltration or atelectasis in the right lung base.  Left ribs appear intact.   Original Report Authenticated By: Burman Nieves, M.D.    Dg Lumbar Spine Complete 06/07/2012  *RADIOLOGY REPORT*  Clinical Data: Mid low back pain after fall.  LUMBAR SPINE - COMPLETE 4+ VIEW  Comparison: 05/23/2012  Findings: Five lumbar type vertebrae.  Normal alignment of the lumbar vertebrae and facet joints.  Mild anterior wedging of T12 associated with narrowed T12/L1 interspace, likely due to degenerative changes.  This is stable since the previous study.  No new vertebral compression deformities.  Mild degenerative changes with endplate hypertrophic changes throughout.  Degenerative changes in the facet joints.  No focal bone lesion or bone destruction.  Bone cortex and trabecular architecture appear intact.  No significant change since previous study.  IMPRESSION:  Degenerative changes.  No acute displaced fractures identified.   Original Report Authenticated By: Burman Nieves, M.D.    Ct Head Wo Contrast 06/07/2012  *RADIOLOGY REPORT*  Clinical Data: Head injury after fall.  Left-sided back pain.  CT HEAD WITHOUT CONTRAST  Technique:  Contiguous axial images were obtained from the base of the skull through the vertex without contrast.  Comparison: 07/06/2007  Findings: Mild diffuse cerebral atrophy.  Ventricular dilatation is likely due to central atrophy but normal pressure hydrocephalus is not excluded.  Scattered low attenuation change in the deep white matter consistent with small vessel ischemia.  Stable appearance since previous study.  No mass effect or midline shift.  No abnormal extra-axial fluid collections.  Gray-white matter junctions are distinct.  Basal cisterns are not effaced.  No evidence of acute intracranial hemorrhage.  Vascular calcifications.  Visualized paranasal sinuses and mastoid air cells are not opacified.  No depressed skull fractures.  IMPRESSION: No acute intracranial abnormalities.  Chronic atrophy and small vessel ischemic changes.   Original Report Authenticated By: Burman Nieves, M.D.      0120:  No acute fx on XR.  CT head without acute abnl.  Will tx symptomatically and send back  to NH.      Laray Anger, DO 06/08/12 [id number]

## 2012-06-07 NOTE — ED Notes (Signed)
Called [facility] to transport pt back to facility. Awaiting arrival. Pt informed. Called and report given

## 2012-06-10 ENCOUNTER — Other Ambulatory Visit: Payer: Self-pay | Admitting: Gastroenterology

## 2012-06-11 ENCOUNTER — Emergency Department (HOSPITAL_BASED_OUTPATIENT_CLINIC_OR_DEPARTMENT_OTHER): Payer: Medicare Other

## 2012-06-11 ENCOUNTER — Emergency Department (HOSPITAL_BASED_OUTPATIENT_CLINIC_OR_DEPARTMENT_OTHER)
Admission: EM | Admit: 2012-06-11 | Discharge: 2012-06-11 | Disposition: A | Discharge status: Home / Self Care | Payer: Medicare Other | Attending: Emergency Medicine | Admitting: Emergency Medicine

## 2012-06-11 ENCOUNTER — Encounter (HOSPITAL_BASED_OUTPATIENT_CLINIC_OR_DEPARTMENT_OTHER): Payer: Self-pay | Admitting: Emergency Medicine

## 2012-06-11 DIAGNOSIS — F319 Bipolar disorder, unspecified: Secondary | ICD-10-CM | POA: Insufficient documentation

## 2012-06-11 DIAGNOSIS — Z862 Personal history of diseases of the blood and blood-forming organs and certain disorders involving the immune mechanism: Secondary | ICD-10-CM | POA: Insufficient documentation

## 2012-06-11 DIAGNOSIS — Z8739 Personal history of other diseases of the musculoskeletal system and connective tissue: Secondary | ICD-10-CM | POA: Insufficient documentation

## 2012-06-11 DIAGNOSIS — Z8719 Personal history of other diseases of the digestive system: Secondary | ICD-10-CM | POA: Insufficient documentation

## 2012-06-11 DIAGNOSIS — Y9210 Unspecified residential institution as the place of occurrence of the external cause: Secondary | ICD-10-CM | POA: Insufficient documentation

## 2012-06-11 DIAGNOSIS — Z79899 Other long term (current) drug therapy: Secondary | ICD-10-CM | POA: Insufficient documentation

## 2012-06-11 DIAGNOSIS — Z87448 Personal history of other diseases of urinary system: Secondary | ICD-10-CM | POA: Insufficient documentation

## 2012-06-11 DIAGNOSIS — Y939 Activity, unspecified: Secondary | ICD-10-CM | POA: Insufficient documentation

## 2012-06-11 DIAGNOSIS — F411 Generalized anxiety disorder: Secondary | ICD-10-CM | POA: Insufficient documentation

## 2012-06-11 DIAGNOSIS — Z87891 Personal history of nicotine dependence: Secondary | ICD-10-CM | POA: Insufficient documentation

## 2012-06-11 DIAGNOSIS — Z860100 Personal history of colon polyps, unspecified: Secondary | ICD-10-CM | POA: Insufficient documentation

## 2012-06-11 DIAGNOSIS — R296 Repeated falls: Secondary | ICD-10-CM | POA: Insufficient documentation

## 2012-06-11 DIAGNOSIS — Z8639 Personal history of other endocrine, nutritional and metabolic disease: Secondary | ICD-10-CM | POA: Insufficient documentation

## 2012-06-11 DIAGNOSIS — I10 Essential (primary) hypertension: Secondary | ICD-10-CM | POA: Insufficient documentation

## 2012-06-11 DIAGNOSIS — S20219A Contusion of unspecified front wall of thorax, initial encounter: Secondary | ICD-10-CM | POA: Insufficient documentation

## 2012-06-11 MED ORDER — MECLIZINE HCL 25 MG PO TABS: 25.0000 mg | ORAL_TABLET | Freq: Three times a day (TID) | ORAL | Status: DC | PRN

## 2012-06-11 NOTE — ED Notes (Signed)
[facility] here for transport now

## 2012-06-11 NOTE — ED Notes (Signed)
[facility] called for transport.  

## 2012-06-11 NOTE — ED Provider Notes (Signed)
History     CSN: [id number]  Arrival date & time 06/11/12  0254   First MD Initiated Contact with Patient 06/11/12 [phone]      Chief Complaint  Patient presents with  . Dizziness    (Consider location/radiation/quality/duration/timing/severity/associated sxs/prior treatment) HPI This is a 67 year old female with multiple medical problems. She is a resident of a nursing home. She was brought after staff reportedly found her on the floor, apparently having fallen. She was seen here 5 days ago after a fall. At that time she contused her left posterior ribs as well as several other places on her back. X-rays of the LS spine and left ribs were negative for fracture. A CT scan of her head showed no acute pathology.  The patient herself denies that she fell this morning. She states that she requested transport here because she has been dizzy for the past 2 days. She states she has had multiple episodes of dizziness over the past 6 months. She states she's been evaluated by a neurologist without a definitive diagnosis. She states she's been given an unspecified medication for dizziness, but she does not take it because it does not help. The dizziness makes it difficult for her to ambulate, but she is able to ambulate with a walker. She states her dizziness is very mild at the present time. She denies nausea or vomiting associated with the dizziness. She does complain of continuing pain in her left posterior ribs.  Past Medical History  Diagnosis Date  . Constipation   . Bipolar 1 disorder   . Hypertension   . Esophageal reflux   . Anxiety   . Back pain   . Osteoarthritis   . Gout   . Adenomatous polyps 03-27-07  . Diverticulosis   . Internal hemorrhoids   . Hypothyroidism   . Renal insufficiency     Past Surgical History  Procedure Date  . Cholecystectomy   . Appendectomy   . Tonsillectomy   . Tubal ligation     Family History  Problem Relation Age of Onset  . Hypertension Mother    . Atrial fibrillation Father   . Colon cancer Neg Hx   . Heart disease Mother     History  Substance Use Topics  . Smoking status: Former Smoker    Quit date: 05/08/2003  . Smokeless tobacco: Never Used  . Alcohol Use: No    OB History    Grav Para Term Preterm Abortions TAB SAB Ect Mult Living                  Review of Systems  All other systems reviewed and are negative.    Allergies  Review of patient's allergies indicates no known allergies.  Home Medications   Current Outpatient Rx  Name  Route  Sig  Dispense  Refill  . CYCLOBENZAPRINE HCL 5 MG PO TABS   Oral   Take 5 mg by mouth 3 (three) times daily as needed.         Marland Kitchen MECLIZINE HCL 25 MG PO TABS   Oral   Take 25 mg by mouth 3 (three) times daily as needed.         Marland Kitchen SIMETHICONE 80 MG PO CHEW   Oral   Chew 80 mg by mouth every 6 (six) hours as needed.         . ACETAMINOPHEN 500 MG PO TABS   Oral   Take 1,000 mg by mouth every 4 (four) hours  as needed. For pain.         Marland Kitchen ALPRAZOLAM 1 MG PO TABS   Oral   Take 0.5-1 mg by mouth 3 (three) times daily. Scheduled. Give 1 tablet in the morning, 0.5 tablet at lunch, & 1 tablet at 6pm.         . ALPRAZOLAM 1 MG PO TABS   Oral   Take 0.5 mg by mouth daily as needed. For anxiety or sleep         . AMITIZA 24 MCG PO CAPS      TAKE (1) CAPSULE BY MOUTH TWICE DAILY WITH FOOD.   60 capsule   0   . AMLODIPINE BESYLATE 10 MG PO TABS   Oral   Take 10 mg by mouth daily.          . ASPIRIN 500 MG PO TABS   Oral   Take 500 mg by mouth every 4 (four) hours as needed. For pain         . CETIRIZINE HCL 10 MG PO TABS   Oral   Take 10 mg by mouth daily as needed. For allergies.         . DOCUSATE SODIUM 100 MG PO CAPS   Oral   Take 200-300 mg by mouth 2 (two) times daily. 3 caps in am, 2 caps in pm         . FAMOTIDINE 20 MG PO TABS   Oral   Take 20 mg by mouth 2 (two) times daily.           . GUAIFENESIN 100 MG/5ML PO SYRP    Oral   Take 200 mg by mouth 4 (four) times daily as needed. For cough         . LABETALOL HCL 200 MG PO TABS   Oral   Take 200 mg by mouth 2 (two) times daily.           Marland Kitchen LAMOTRIGINE 200 MG PO TABS   Oral   Take 200 mg by mouth 2 (two) times daily.          Marland Kitchen LEVOTHYROXINE SODIUM 50 MCG PO TABS   Oral   Take 50 mcg by mouth daily before breakfast.          . MAGNESIUM HYDROXIDE 400 MG/5ML PO SUSP   Oral   Take 30-60 mLs by mouth daily as needed. For constipation.   360 mL   1   . OMEGA-3-ACID ETHYL ESTERS 1 G PO CAPS   Oral   Take 4 g by mouth daily.          Marland Kitchen OXCARBAZEPINE 300 MG PO TABS   Oral   Take 900 mg by mouth 2 (two) times daily.          Marland Kitchen POLYETHYLENE GLYCOL 3350 PO PACK   Oral   Take 17 g by mouth daily.         . PSYLLIUM 95 % PO PACK   Oral   Take 1 packet by mouth 2 (two) times daily.         Marland Kitchen RISPERIDONE 2 MG PO TABS   Oral   Take 4 mg by mouth 2 (two) times daily.          . DISPOSABLE ENEMA 19-7 GM/118ML RE ENEM   Rectal   Place 1 enema rectally as needed. For constipation.         . TRAMADOL HCL 50 MG PO TABS  Oral   Take 1 tablet (50 mg total) by mouth 2 (two) times daily as needed for pain.   30 tablet   0     BP 158/54  Pulse 66  Resp 20  SpO2 98%  Physical Exam General: Well-developed, well-nourished female in no acute distress; appearance consistent with age of record HENT: normocephalic, atraumatic Eyes: pupils equal round and reactive to light; extraocular muscles intact; no nystagmus Neck: supple Heart: regular rate and rhythm Lungs: clear to auscultation bilaterally Back: Multiple ecchymoses of the back, subacute appearing, the largest overlying the left lower posterior ribs with mild to moderate tenderness but no crepitus Abdomen: soft; nondistended; nontender; bowel sounds present Extremities: No deformity; full range of motion Neurologic: Awake, alert and oriented; motor function intact in all  extremities and symmetric; no facial droop Skin: Warm and dry Psychiatric: Flat affect    ED Course  Procedures (including critical care time)     MDM  Nursing notes and vitals signs, including pulse oximetry, reviewed.  Summary of this visit's results, reviewed by myself:  Imaging Studies: Dg Ribs Unilateral W/chest Left  06/11/2012  *RADIOLOGY REPORT*  Clinical Data: Left rib pain after fall 3 days ago.  LEFT RIBS AND CHEST - 3+ VIEW  Comparison: 06/07/2012  Findings: Shallow inspiration.  Cardiac enlargement.  Pulmonary vascularity appears to be increasing since previous study suggesting vascular congestion.  No focal airspace disease or consolidation in the lungs.  No blunting of costophrenic angles. No pneumothorax.  Calcification of the aorta.  The left ribs appear intact.  No displaced fractures or focal bone lesions are appreciated.  IMPRESSION: Cardiac enlargement with apparent developing pulmonary vascular congestion.  No edema or infiltration.  Left ribs appear intact.   Original Report Authenticated By: Burman Nieves, M.D.             Hanley Seamen, MD 06/11/12 [phone]

## 2012-06-11 NOTE — ED Notes (Signed)
Fall x 3 days ago then poss again tonight.  Complains of rt posterior rib pain

## 2012-06-17 ENCOUNTER — Emergency Department (HOSPITAL_BASED_OUTPATIENT_CLINIC_OR_DEPARTMENT_OTHER): Payer: Medicare Other

## 2012-06-17 ENCOUNTER — Telehealth: Payer: Self-pay

## 2012-06-17 ENCOUNTER — Emergency Department (HOSPITAL_BASED_OUTPATIENT_CLINIC_OR_DEPARTMENT_OTHER)
Admission: EM | Admit: 2012-06-17 | Discharge: 2012-06-17 | Disposition: A | Discharge status: Home / Self Care | Payer: Medicare Other | Attending: Emergency Medicine | Admitting: Emergency Medicine

## 2012-06-17 ENCOUNTER — Encounter (HOSPITAL_BASED_OUTPATIENT_CLINIC_OR_DEPARTMENT_OTHER): Payer: Self-pay | Admitting: *Deleted

## 2012-06-17 DIAGNOSIS — E039 Hypothyroidism, unspecified: Secondary | ICD-10-CM | POA: Insufficient documentation

## 2012-06-17 DIAGNOSIS — Z860100 Personal history of colon polyps, unspecified: Secondary | ICD-10-CM | POA: Insufficient documentation

## 2012-06-17 DIAGNOSIS — I10 Essential (primary) hypertension: Secondary | ICD-10-CM | POA: Insufficient documentation

## 2012-06-17 DIAGNOSIS — F411 Generalized anxiety disorder: Secondary | ICD-10-CM | POA: Insufficient documentation

## 2012-06-17 DIAGNOSIS — S0990XA Unspecified injury of head, initial encounter: Secondary | ICD-10-CM | POA: Insufficient documentation

## 2012-06-17 DIAGNOSIS — F319 Bipolar disorder, unspecified: Secondary | ICD-10-CM | POA: Insufficient documentation

## 2012-06-17 DIAGNOSIS — Z79899 Other long term (current) drug therapy: Secondary | ICD-10-CM | POA: Insufficient documentation

## 2012-06-17 DIAGNOSIS — Y929 Unspecified place or not applicable: Secondary | ICD-10-CM | POA: Insufficient documentation

## 2012-06-17 DIAGNOSIS — W1809XA Striking against other object with subsequent fall, initial encounter: Secondary | ICD-10-CM | POA: Insufficient documentation

## 2012-06-17 DIAGNOSIS — Z87891 Personal history of nicotine dependence: Secondary | ICD-10-CM | POA: Insufficient documentation

## 2012-06-17 DIAGNOSIS — Y939 Activity, unspecified: Secondary | ICD-10-CM | POA: Insufficient documentation

## 2012-06-17 DIAGNOSIS — W19XXXA Unspecified fall, initial encounter: Secondary | ICD-10-CM

## 2012-06-17 DIAGNOSIS — Z8719 Personal history of other diseases of the digestive system: Secondary | ICD-10-CM | POA: Insufficient documentation

## 2012-06-17 NOTE — ED Notes (Signed)
Brought in by EMS for fall from standing , denies injury

## 2012-06-17 NOTE — Telephone Encounter (Signed)
Called patient to schedule a colonoscopy, as discussed at her last office visit.  Patient stated she had had a bad fall and was in a lot of pain, almost unable to walk.  She said she would call me to schedule the procedure when she healed from her injuries.

## 2012-06-17 NOTE — ED Notes (Signed)
[facility] called for transport back to [facility]

## 2012-06-17 NOTE — ED Provider Notes (Addendum)
History     CSN: [id number]  Arrival date & time 06/17/12  1844   First MD Initiated Contact with Patient 06/17/12 1839      Chief Complaint  Patient presents with  . Fall    (Consider location/radiation/quality/duration/timing/severity/associated sxs/prior treatment) Patient is a 67 y.o. female presenting with fall. The history is provided by the patient and the EMS personnel.  Fall The accident occurred 1 to 2 hours ago. The fall occurred while walking ( patient was waalking back into her room and was trying tto  kick some socks out of the way with her right foot and when she did this she lost her balance and fell backwards hitting her head on the wall). She fell from a height of 1 to 2 ft. She landed on a hard floor. There was no blood loss. The point of impact was the head. Pain location: no pain. The pain is at a severity of 0/10. The patient is experiencing no pain. She was not ambulatory at the scene (Patient states they would not let her attempt to walk after the fall). Pertinent negatives include no visual change, no numbness, no abdominal pain, no vomiting, no headaches, no loss of consciousness and no tingling. Exacerbated by: nothing. She has tried nothing for the symptoms.    Past Medical History  Diagnosis Date  . Constipation   . Bipolar 1 disorder   . Hypertension   . Esophageal reflux   . Anxiety   . Back pain   . Osteoarthritis   . Gout   . Adenomatous polyps 03-27-07  . Diverticulosis   . Internal hemorrhoids   . Hypothyroidism   . Renal insufficiency     Past Surgical History  Procedure Laterality Date  . Cholecystectomy    . Appendectomy    . Tonsillectomy    . Tubal ligation      Family History  Problem Relation Age of Onset  . Hypertension Mother   . Atrial fibrillation Father   . Colon cancer Neg Hx   . Heart disease Mother     History  Substance Use Topics  . Smoking status: Former Smoker    Quit date: 05/08/2003  . Smokeless tobacco:  Never Used  . Alcohol Use: No    OB History   Grav Para Term Preterm Abortions TAB SAB Ect Mult Living                  Review of Systems  Gastrointestinal: Negative for vomiting and abdominal pain.  Neurological: Negative for tingling, loss of consciousness, numbness and headaches.  All other systems reviewed and are negative.    Allergies  Review of patient's allergies indicates no known allergies.  Home Medications   Current Outpatient Rx  Name  Route  Sig  Dispense  Refill  . acetaminophen (TYLENOL) 500 MG tablet   Oral   Take 1,000 mg by mouth every 4 (four) hours as needed. For pain.         Marland Kitchen ALPRAZolam (XANAX) 1 MG tablet   Oral   Take 0.5-1 mg by mouth 3 (three) times daily. Scheduled. Give 1 tablet in the morning, 0.5 tablet at lunch, & 1 tablet at 6pm.         . ALPRAZolam (XANAX) 1 MG tablet   Oral   Take 0.5 mg by mouth daily as needed. For anxiety or sleep         . AMITIZA 24 MCG capsule  TAKE (1) CAPSULE BY MOUTH TWICE DAILY WITH FOOD.   60 capsule   0   . amLODipine (NORVASC) 10 MG tablet   Oral   Take 10 mg by mouth daily.          Marland Kitchen aspirin 500 MG tablet   Oral   Take 500 mg by mouth every 4 (four) hours as needed. For pain         . cetirizine (ZYRTEC) 10 MG tablet   Oral   Take 10 mg by mouth daily as needed. For allergies.         . cyclobenzaprine (FLEXERIL) 5 MG tablet   Oral   Take 5 mg by mouth 3 (three) times daily as needed.         . docusate sodium (COLACE) 100 MG capsule   Oral   Take 200-300 mg by mouth 2 (two) times daily. 3 caps in am, 2 caps in pm         . famotidine (PEPCID) 20 MG tablet   Oral   Take 20 mg by mouth 2 (two) times daily.           Marland Kitchen guaifenesin (ROBITUSSIN) 100 MG/5ML syrup   Oral   Take 200 mg by mouth 4 (four) times daily as needed. For cough         . labetalol (NORMODYNE) 200 MG tablet   Oral   Take 200 mg by mouth 2 (two) times daily.           Marland Kitchen lamoTRIgine  (LAMICTAL) 200 MG tablet   Oral   Take 200 mg by mouth 2 (two) times daily.          Marland Kitchen levothyroxine (SYNTHROID, LEVOTHROID) 50 MCG tablet   Oral   Take 50 mcg by mouth daily before breakfast.          . magnesium hydroxide (MILK OF MAGNESIA) 400 MG/5ML suspension   Oral   Take 30-60 mLs by mouth daily as needed. For constipation.   360 mL   1   . meclizine (ANTIVERT) 25 MG tablet   Oral   Take 1 tablet (25 mg total) by mouth 3 (three) times daily as needed for dizziness.   30 tablet   0   . omega-3 acid ethyl esters (LOVAZA) 1 G capsule   Oral   Take 4 g by mouth daily.          . Oxcarbazepine (TRILEPTAL) 300 MG tablet   Oral   Take 900 mg by mouth 2 (two) times daily.          . polyethylene glycol (MIRALAX / GLYCOLAX) packet   Oral   Take 17 g by mouth daily.         . psyllium (HYDROCIL/METAMUCIL) 95 % PACK   Oral   Take 1 packet by mouth 2 (two) times daily.         . risperiDONE (RISPERDAL) 2 MG tablet   Oral   Take 4 mg by mouth 2 (two) times daily.          . simethicone (MYLICON) 80 MG chewable tablet   Oral   Chew 80 mg by mouth every 6 (six) hours as needed.         . sodium phosphate (FLEET) enema   Rectal   Place 1 enema rectally as needed. For constipation.         . traMADol (ULTRAM) 50 MG tablet   Oral  Take 1 tablet (50 mg total) by mouth 2 (two) times daily as needed for pain.   30 tablet   0     BP 107/47  Pulse 56  Temp(Src) 97.8 F (36.6 C) (Oral)  Resp 20  SpO2 96%  Physical Exam  Nursing note and vitals reviewed. Constitutional: She is oriented to person, place, and time. She appears well-developed and well-nourished. No distress.  HENT:  Head: Normocephalic and atraumatic.  Mouth/Throat: Oropharynx is clear and moist.  Eyes: Conjunctivae and EOM are normal. Pupils are equal, round, and reactive to light.  Neck: Normal range of motion. Neck supple. No spinous process tenderness and no muscular tenderness  present.  Cardiovascular: Normal rate, regular rhythm and intact distal pulses.   No murmur heard. Pulmonary/Chest: Effort normal and breath sounds normal. No respiratory distress. She has no wheezes. She has no rales.  Abdominal: Soft. She exhibits no distension. There is no tenderness. There is no rebound and no guarding.  Musculoskeletal: Normal range of motion. She exhibits no edema and no tenderness.  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  Skin: Skin is warm and dry. No rash noted. No erythema.  Scattered ecchymoses of varying ages healing over at the bilateral upper extremities  Psychiatric: She has a normal mood and affect. Her behavior is normal.    ED Course  Procedures (including critical care time)  Labs Reviewed - No data to display Ct Head Wo Contrast  06/17/2012  *RADIOLOGY REPORT*  Clinical Data: Larey Seat hitting back of head.  Denies pain.  CT HEAD WITHOUT CONTRAST  Technique:  Contiguous axial images were obtained from the base of the skull through the vertex without contrast.  Comparison: 06/07/2012.  Findings: Mild diffuse cerebral atrophy.  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  Small vessel ischemic change throughout the white matter.  No acute stroke or hemorrhage.  No mass lesion or extra-axial fluid. Calvarium intact.  Clear sinuses and mastoids.  Stable appearance from priors.  IMPRESSION: No acute intracranial findings.  Chronic changes as described.   Original Report Authenticated By: Davonna Belling, M.D.      1. Fall       MDM   Patient with a mechanical fall today causing her to fall backwards and hit her head on the wall. She has no complaints currently. She denies LOC.  No neck or back pain and is neurologically intact. Patient states she is currently on an antibiotic for urinary tract infection but denies  any dizziness or weakness. Head CT is negative the patient was discharged from        Gwyneth Sprout, MD 06/17/12 1921  Gwyneth Sprout, MD 06/17/12 Ernestina [place]

## 2012-07-02 ENCOUNTER — Other Ambulatory Visit: Payer: Self-pay | Admitting: Gastroenterology

## 2012-08-19 ENCOUNTER — Encounter (HOSPITAL_COMMUNITY): Payer: Self-pay

## 2012-08-19 ENCOUNTER — Emergency Department (HOSPITAL_COMMUNITY)
Admission: EM | Admit: 2012-08-19 | Discharge: 2012-08-19 | Disposition: A | Discharge status: Home / Self Care | Payer: Medicare Other | Attending: Emergency Medicine | Admitting: Emergency Medicine

## 2012-08-19 DIAGNOSIS — F319 Bipolar disorder, unspecified: Secondary | ICD-10-CM | POA: Insufficient documentation

## 2012-08-19 DIAGNOSIS — F411 Generalized anxiety disorder: Secondary | ICD-10-CM | POA: Insufficient documentation

## 2012-08-19 DIAGNOSIS — Z8639 Personal history of other endocrine, nutritional and metabolic disease: Secondary | ICD-10-CM | POA: Insufficient documentation

## 2012-08-19 DIAGNOSIS — Z8719 Personal history of other diseases of the digestive system: Secondary | ICD-10-CM | POA: Insufficient documentation

## 2012-08-19 DIAGNOSIS — I10 Essential (primary) hypertension: Secondary | ICD-10-CM | POA: Insufficient documentation

## 2012-08-19 DIAGNOSIS — Z87448 Personal history of other diseases of urinary system: Secondary | ICD-10-CM | POA: Insufficient documentation

## 2012-08-19 DIAGNOSIS — Z87891 Personal history of nicotine dependence: Secondary | ICD-10-CM | POA: Insufficient documentation

## 2012-08-19 DIAGNOSIS — Z860100 Personal history of colon polyps, unspecified: Secondary | ICD-10-CM | POA: Insufficient documentation

## 2012-08-19 DIAGNOSIS — K5900 Constipation, unspecified: Secondary | ICD-10-CM | POA: Insufficient documentation

## 2012-08-19 DIAGNOSIS — Z79899 Other long term (current) drug therapy: Secondary | ICD-10-CM | POA: Insufficient documentation

## 2012-08-19 DIAGNOSIS — Z8739 Personal history of other diseases of the musculoskeletal system and connective tissue: Secondary | ICD-10-CM | POA: Insufficient documentation

## 2012-08-19 DIAGNOSIS — E039 Hypothyroidism, unspecified: Secondary | ICD-10-CM | POA: Insufficient documentation

## 2012-08-19 DIAGNOSIS — K219 Gastro-esophageal reflux disease without esophagitis: Secondary | ICD-10-CM | POA: Insufficient documentation

## 2012-08-19 DIAGNOSIS — Z862 Personal history of diseases of the blood and blood-forming organs and certain disorders involving the immune mechanism: Secondary | ICD-10-CM | POA: Insufficient documentation

## 2012-08-19 DIAGNOSIS — Z8679 Personal history of other diseases of the circulatory system: Secondary | ICD-10-CM | POA: Insufficient documentation

## 2012-08-19 MED ORDER — DOCUSATE SODIUM 100 MG PO CAPS: 100.0000 mg | ORAL_CAPSULE | Freq: Two times a day (BID) | ORAL | Status: DC

## 2012-08-19 NOTE — ED Notes (Signed)
Pt brought in via [facility] from [facility]. Pt c/o constipation. Pt unsure of last bowel movement. Pt reports different time frames for last BM from 2 weeks to 3 weeks to today. EMS reports increase in BP.

## 2012-08-19 NOTE — ED Provider Notes (Signed)
History    CSN: [id number] Arrival date & time 08/19/12  0225 First MD Initiated Contact with Patient 08/19/12 [phone]      Chief Complaint  Patient presents with  . Constipation    HPI Pt had a bowel movement this evening.  She thought that she was constipated and needed to have another bowel movement.  She is feeling better now in the emergency department.  She has not vomiting or diarrhea.  She has not had any fever.  No abdominal pain.  Past Medical History  Diagnosis Date  . Constipation   . Bipolar 1 disorder   . Hypertension   . Esophageal reflux   . Anxiety   . Back pain   . Osteoarthritis   . Gout   . Adenomatous polyps 03-27-07  . Diverticulosis   . Internal hemorrhoids   . Hypothyroidism   . Renal insufficiency     Past Surgical History  Procedure Laterality Date  . Cholecystectomy    . Appendectomy    . Tonsillectomy    . Tubal ligation      Family History  Problem Relation Age of Onset  . Hypertension Mother   . Atrial fibrillation Father   . Colon cancer Neg Hx   . Heart disease Mother     History  Substance Use Topics  . Smoking status: Former Smoker    Quit date: 05/08/2003  . Smokeless tobacco: Never Used  . Alcohol Use: No    OB History   Grav Para Term Preterm Abortions TAB SAB Ect Mult Living                  Review of Systems  All other systems reviewed and are negative.    Allergies  Review of patient's allergies indicates no known allergies.  Home Medications   Current Outpatient Rx  Name  Route  Sig  Dispense  Refill  . AMITIZA 24 MCG capsule      TAKE (1) CAPSULE BY MOUTH TWICE DAILY WITH FOOD.   60 capsule   1   . amLODipine (NORVASC) 10 MG tablet   Oral   Take 10 mg by mouth daily.          . traMADol (ULTRAM) 50 MG tablet   Oral   Take 1 tablet (50 mg total) by mouth 2 (two) times daily as needed for pain.   30 tablet   0   . acetaminophen (TYLENOL) 500 MG tablet   Oral   Take 1,000 mg by mouth every  4 (four) hours as needed. For pain.         Marland Kitchen ALPRAZolam (XANAX) 1 MG tablet   Oral   Take 1 mg by mouth 3 (three) times daily. Scheduled     And may also take 1 tab prn q6h with scheduled         . cetirizine (ZYRTEC) 10 MG tablet   Oral   Take 10 mg by mouth daily as needed. For allergies.         . cyclobenzaprine (FLEXERIL) 5 MG tablet   Oral   Take 5 mg by mouth 3 (three) times daily as needed.         . docusate sodium (COLACE) 100 MG capsule   Oral   Take 200-300 mg by mouth 2 (two) times daily. 3 caps in am, 2 caps in pm         . famotidine (PEPCID) 20 MG tablet   Oral  Take 20 mg by mouth 2 (two) times daily.           Marland Kitchen guaifenesin (ROBITUSSIN) 100 MG/5ML syrup   Oral   Take 200 mg by mouth 4 (four) times daily as needed. For cough         . labetalol (NORMODYNE) 200 MG tablet   Oral   Take 200 mg by mouth 2 (two) times daily.           Marland Kitchen lamoTRIgine (LAMICTAL) 200 MG tablet   Oral   Take 200 mg by mouth 2 (two) times daily.          Marland Kitchen levothyroxine (SYNTHROID, LEVOTHROID) 50 MCG tablet   Oral   Take 50 mcg by mouth daily before breakfast.          . magnesium hydroxide (MILK OF MAGNESIA) 400 MG/5ML suspension   Oral   Take 30-60 mLs by mouth daily as needed. For constipation.   360 mL   1   . meclizine (ANTIVERT) 25 MG tablet   Oral   Take 1 tablet (25 mg total) by mouth 3 (three) times daily as needed for dizziness.   30 tablet   0   . nitrofurantoin (MACRODANTIN) 50 MG capsule   Oral   Take 50 mg by mouth at bedtime.         Marland Kitchen omega-3 acid ethyl esters (LOVAZA) 1 G capsule   Oral   Take 4 g by mouth daily.          . Oxcarbazepine (TRILEPTAL) 300 MG tablet   Oral   Take 900 mg by mouth 2 (two) times daily.          . polyethylene glycol (MIRALAX / GLYCOLAX) packet   Oral   Take 17 g by mouth daily.         . psyllium (HYDROCIL/METAMUCIL) 95 % PACK   Oral   Take 1 packet by mouth 2 (two) times daily.          . risperiDONE (RISPERDAL) 2 MG tablet   Oral   Take 4 mg by mouth 2 (two) times daily.          . sodium phosphate (FLEET) enema   Rectal   Place 1 enema rectally as needed. For constipation.         . sulfamethoxazole-trimethoprim (BACTRIM DS) 800-160 MG per tablet   Oral   Take 1 tablet by mouth 2 (two) times daily. 7 day course           BP 137/56  Pulse 67  Temp(Src) 98.3 F (36.8 C) (Oral)  Resp 18  SpO2 97%  Physical Exam  Nursing note and vitals reviewed. Constitutional: She appears well-developed and well-nourished. No distress.  HENT:  Head: Normocephalic and atraumatic.  Right Ear: External ear normal.  Left Ear: External ear normal.  Eyes: Conjunctivae are normal. Right eye exhibits no discharge. Left eye exhibits no discharge. No scleral icterus.  Neck: Neck supple. No tracheal deviation present.  Cardiovascular: Normal rate, regular rhythm and intact distal pulses.   Pulmonary/Chest: Effort normal and breath sounds normal. No stridor. No respiratory distress. She has no wheezes. She has no rales.  Abdominal: Soft. Bowel sounds are normal. She exhibits no distension. There is no tenderness. There is no rebound and no guarding.  Genitourinary:  Rectal exam normal, and no fecal impaction  Musculoskeletal: She exhibits no edema and no tenderness.  Neurological: She is alert. She has normal strength. No  sensory deficit. Cranial nerve deficit:  no gross defecits noted. She exhibits normal muscle tone. She displays no seizure activity. Coordination normal.  Skin: Skin is warm and dry. No rash noted.  Psychiatric: She has a normal mood and affect.    ED Course  Procedures (including critical care time)  Labs Reviewed - No data to display No results found.   1. Constipation       MDM  Patient's exam is unremarkable.  She feels well and would like to go home. She's not having any abdominal pain. There is no evidence of fecal impaction on my exam.   patient will be discharged home with a prescription for Colace        Celene Kras, MD 08/19/12 [phone]

## 2012-08-19 NOTE — ED Notes (Signed)
Pt told Clinical research associate that she wants to d/c back to the Nursing Home b/c she is afraid her husband will divorce her.  Animal nutritionist.

## 2012-08-19 NOTE — ED Notes (Signed)
AVW:[id number]<XB> Expected date:08/19/12<BR> Expected time: 2:08 AM<BR> Means of arrival:Ambulance<BR> Comments:<BR> Constipation

## 2012-10-21 ENCOUNTER — Encounter (HOSPITAL_COMMUNITY): Payer: Self-pay | Admitting: *Deleted

## 2012-10-21 ENCOUNTER — Emergency Department (HOSPITAL_COMMUNITY): Payer: Medicare Other

## 2012-10-21 ENCOUNTER — Emergency Department (HOSPITAL_COMMUNITY)
Admission: EM | Admit: 2012-10-21 | Discharge: 2012-10-21 | Disposition: A | Discharge status: Skilled Nursing Facility | Payer: Medicare Other | Attending: Emergency Medicine | Admitting: Emergency Medicine

## 2012-10-21 DIAGNOSIS — F319 Bipolar disorder, unspecified: Secondary | ICD-10-CM | POA: Insufficient documentation

## 2012-10-21 DIAGNOSIS — Z87891 Personal history of nicotine dependence: Secondary | ICD-10-CM | POA: Insufficient documentation

## 2012-10-21 DIAGNOSIS — K219 Gastro-esophageal reflux disease without esophagitis: Secondary | ICD-10-CM | POA: Insufficient documentation

## 2012-10-21 DIAGNOSIS — Z8719 Personal history of other diseases of the digestive system: Secondary | ICD-10-CM | POA: Insufficient documentation

## 2012-10-21 DIAGNOSIS — Z79899 Other long term (current) drug therapy: Secondary | ICD-10-CM | POA: Insufficient documentation

## 2012-10-21 DIAGNOSIS — F411 Generalized anxiety disorder: Secondary | ICD-10-CM | POA: Insufficient documentation

## 2012-10-21 DIAGNOSIS — Z860100 Personal history of colon polyps, unspecified: Secondary | ICD-10-CM | POA: Insufficient documentation

## 2012-10-21 DIAGNOSIS — Z8679 Personal history of other diseases of the circulatory system: Secondary | ICD-10-CM | POA: Insufficient documentation

## 2012-10-21 DIAGNOSIS — I10 Essential (primary) hypertension: Secondary | ICD-10-CM | POA: Insufficient documentation

## 2012-10-21 DIAGNOSIS — W1809XA Striking against other object with subsequent fall, initial encounter: Secondary | ICD-10-CM | POA: Insufficient documentation

## 2012-10-21 DIAGNOSIS — M438X9 Other specified deforming dorsopathies, site unspecified: Secondary | ICD-10-CM | POA: Insufficient documentation

## 2012-10-21 DIAGNOSIS — Z8639 Personal history of other endocrine, nutritional and metabolic disease: Secondary | ICD-10-CM | POA: Insufficient documentation

## 2012-10-21 DIAGNOSIS — S0990XA Unspecified injury of head, initial encounter: Secondary | ICD-10-CM | POA: Insufficient documentation

## 2012-10-21 DIAGNOSIS — M1990 Unspecified osteoarthritis, unspecified site: Secondary | ICD-10-CM | POA: Insufficient documentation

## 2012-10-21 DIAGNOSIS — Y9301 Activity, walking, marching and hiking: Secondary | ICD-10-CM | POA: Insufficient documentation

## 2012-10-21 DIAGNOSIS — Y9210 Unspecified residential institution as the place of occurrence of the external cause: Secondary | ICD-10-CM | POA: Insufficient documentation

## 2012-10-21 DIAGNOSIS — Z862 Personal history of diseases of the blood and blood-forming organs and certain disorders involving the immune mechanism: Secondary | ICD-10-CM | POA: Insufficient documentation

## 2012-10-21 DIAGNOSIS — Z87448 Personal history of other diseases of urinary system: Secondary | ICD-10-CM | POA: Insufficient documentation

## 2012-10-21 DIAGNOSIS — S0993XA Unspecified injury of face, initial encounter: Secondary | ICD-10-CM | POA: Insufficient documentation

## 2012-10-21 DIAGNOSIS — W19XXXA Unspecified fall, initial encounter: Secondary | ICD-10-CM

## 2012-10-21 DIAGNOSIS — E039 Hypothyroidism, unspecified: Secondary | ICD-10-CM | POA: Insufficient documentation

## 2012-10-21 NOTE — ED Notes (Addendum)
Per EMS: Pt comes from [facility] for c/o fall. Pt Walked from bed to recliner didn't put breaks on walker, sat dow, and walker rolled away. pt hit right side of head. ems denies LOC.

## 2012-10-21 NOTE — ED Notes (Signed)
Pt presents to ED s/p fall at nursing facility. Pt denies pain at present. Reports when she attempted to stand, the wheels on her walker were not locked. Pt lost balance and fell forward landing on right side of face. No bruising noted. Small skin tear to right posterior wrist. Wound care performed.

## 2012-10-21 NOTE — ED Provider Notes (Signed)
History     CSN: [id number]  Arrival date & time 10/21/12  1836   First MD Initiated Contact with Patient 10/21/12 1842      No chief complaint on file.   (Consider location/radiation/quality/duration/timing/severity/associated sxs/prior treatment) HPI Patient presents after a mechanical fall with pain in her neck and face and head. She is not entirely clear about the events, but it seems as though her walker may have slipped, resulting in head trauma.. She complains of sore pain in her right temporal area, and less so in the neck. No report of loss of consciousness, subsequent disorientation, confusion, nausea, visual changes. The patient has notable kyphosis, notes that this is stable.  Past Medical History  Diagnosis Date  . Constipation   . Bipolar 1 disorder   . Hypertension   . Esophageal reflux   . Anxiety   . Back pain   . Osteoarthritis   . Gout   . Adenomatous polyps 03-27-07  . Diverticulosis   . Internal hemorrhoids   . Hypothyroidism   . Renal insufficiency     Past Surgical History  Procedure Laterality Date  . Cholecystectomy    . Appendectomy    . Tonsillectomy    . Tubal ligation      Family History  Problem Relation Age of Onset  . Hypertension Mother   . Atrial fibrillation Father   . Colon cancer Neg Hx   . Heart disease Mother     History  Substance Use Topics  . Smoking status: Former Smoker    Quit date: 05/08/2003  . Smokeless tobacco: Never Used  . Alcohol Use: No    OB History   Grav Para Term Preterm Abortions TAB SAB Ect Mult Living                  Review of Systems  All other systems reviewed and are negative.    Allergies  Review of patient's allergies indicates no known allergies.  Home Medications   Current Outpatient Rx  Name  Route  Sig  Dispense  Refill  . acetaminophen (TYLENOL) 500 MG tablet   Oral   Take 1,000 mg by mouth every 4 (four) hours as needed for pain.          Marland Kitchen ALPRAZolam (XANAX) 1  MG tablet   Oral   Take 1 mg by mouth 3 (three) times daily.          Marland Kitchen ALPRAZolam (XANAX) 1 MG tablet   Oral   Take 1 mg by mouth every 6 (six) hours as needed for anxiety.         Marland Kitchen amLODipine (NORVASC) 10 MG tablet   Oral   Take 10 mg by mouth at bedtime.          . cetirizine (ZYRTEC) 10 MG tablet   Oral   Take 10 mg by mouth daily as needed for allergies.          . cyclobenzaprine (FLEXERIL) 5 MG tablet   Oral   Take 5 mg by mouth 3 (three) times daily as needed for muscle spasms.          Marland Kitchen docusate sodium (COLACE) 100 MG capsule   Oral   Take 200-300 mg by mouth 2 (two) times daily. 3 caps in am, 2 caps in pm         . famotidine (PEPCID) 20 MG tablet   Oral   Take 20 mg by mouth 2 (two) times daily.           Marland Kitchen  guaifenesin (ROBITUSSIN) 100 MG/5ML syrup   Oral   Take 200 mg by mouth 4 (four) times daily as needed for cough.          . labetalol (NORMODYNE) 200 MG tablet   Oral   Take 200 mg by mouth 2 (two) times daily.           Marland Kitchen lamoTRIgine (LAMICTAL) 200 MG tablet   Oral   Take 200 mg by mouth 2 (two) times daily.          Marland Kitchen levothyroxine (SYNTHROID, LEVOTHROID) 75 MCG tablet   Oral   Take 75 mcg by mouth daily before breakfast.         . lubiprostone (AMITIZA) 24 MCG capsule   Oral   Take 24 mcg by mouth 2 (two) times daily with a meal.         . magnesium hydroxide (MILK OF MAGNESIA) 400 MG/5ML suspension   Oral   Take 30-60 mLs by mouth daily as needed for constipation.         . meclizine (ANTIVERT) 25 MG tablet   Oral   Take 1 tablet (25 mg total) by mouth 3 (three) times daily as needed for dizziness.   30 tablet   0   . omega-3 acid ethyl esters (LOVAZA) 1 G capsule   Oral   Take 4 g by mouth daily.          . Oxcarbazepine (TRILEPTAL) 300 MG tablet   Oral   Take 900 mg by mouth 2 (two) times daily.          . polyethylene glycol (MIRALAX / GLYCOLAX) packet   Oral   Take 17 g by mouth daily.          . risperiDONE (RISPERDAL) 2 MG tablet   Oral   Take 4 mg by mouth 2 (two) times daily.          . sodium phosphate (FLEET) enema   Rectal   Place 1 enema rectally as needed (for constipation, may self-administer.).          Marland Kitchen traMADol (ULTRAM) 50 MG tablet   Oral   Take 50 mg by mouth 2 (two) times daily.           BP 149/63  Pulse 51  Temp(Src) 97.4 F (36.3 C) (Oral)  Resp 20  SpO2 99%  Physical Exam  Nursing note and vitals reviewed. Constitutional: She is oriented to person, place, and time. She appears well-developed and well-nourished. No distress.  Visibly kyphotic elderly-appearing female  HENT:  Head: Atraumatic.  Mouth/Throat: Oropharynx is clear and moist.  There is no gross deformity about the face, there is mild tenderness to palpation about the right superior orbital rim.  No malocclusion, no ecchymosis  Eyes: Conjunctivae and EOM are normal.  Neck:  Kyphotic with tenderness to palpation on the lateral neck, but pain at midline with right lateral rotation  Cardiovascular: Normal rate and regular rhythm.   Pulmonary/Chest: Effort normal and breath sounds normal. No stridor. No respiratory distress.  Abdominal: She exhibits no distension.  Musculoskeletal: She exhibits no edema.  Minor skin tear in the right posterior lateral wrist.  No gross deformities.  Beyond kyphosis, the patient seems to stable terms of musculoskeletal findings.  Neurological: She is alert and oriented to person, place, and time. No cranial nerve deficit.  Skin: Skin is warm and dry.  Psychiatric: She has a normal mood and affect.    ED Course  Procedures (including critical care time)  Labs Reviewed - No data to display No results found.   No diagnosis found.  Pulse ox 99% room air normal  8:27 PM Patient sleeping on repeat exam. MDM  This elderly female with notable kyphosis presents after a fall.  She is questionable recall of the fall itself, and given the degree  of facial pain, head pain, neck pain she had radiographic imaging performed.  These studies were reassuring, and she remains medically stable, in no distress throughout the remainder of her ED course.  Absent stress, she was discharged back to her monitor facility after her wound was dressed.        Gerhard Munch, MD 10/21/12 2028

## 2012-10-21 NOTE — ED Notes (Signed)
MD at bedside. 

## 2012-10-29 ENCOUNTER — Encounter (HOSPITAL_COMMUNITY): Payer: Self-pay

## 2012-10-29 ENCOUNTER — Inpatient Hospital Stay (HOSPITAL_COMMUNITY)
Admission: EM | Admit: 2012-10-29 | Discharge: 2012-11-03 | DRG: 309 | Disposition: A | Discharge status: Nursing Home (Includes ICF) | Payer: Medicare Other | Attending: Internal Medicine | Admitting: Internal Medicine

## 2012-10-29 DIAGNOSIS — N390 Urinary tract infection, site not specified: Secondary | ICD-10-CM | POA: Diagnosis present

## 2012-10-29 DIAGNOSIS — R269 Unspecified abnormalities of gait and mobility: Secondary | ICD-10-CM | POA: Diagnosis present

## 2012-10-29 DIAGNOSIS — F314 Bipolar disorder, current episode depressed, severe, without psychotic features: Secondary | ICD-10-CM

## 2012-10-29 DIAGNOSIS — W010XXA Fall on same level from slipping, tripping and stumbling without subsequent striking against object, initial encounter: Secondary | ICD-10-CM | POA: Diagnosis present

## 2012-10-29 DIAGNOSIS — N189 Chronic kidney disease, unspecified: Secondary | ICD-10-CM | POA: Diagnosis present

## 2012-10-29 DIAGNOSIS — A498 Other bacterial infections of unspecified site: Secondary | ICD-10-CM | POA: Diagnosis present

## 2012-10-29 DIAGNOSIS — N179 Acute kidney failure, unspecified: Secondary | ICD-10-CM | POA: Diagnosis present

## 2012-10-29 DIAGNOSIS — N184 Chronic kidney disease, stage 4 (severe): Secondary | ICD-10-CM

## 2012-10-29 DIAGNOSIS — J438 Other emphysema: Secondary | ICD-10-CM | POA: Diagnosis present

## 2012-10-29 DIAGNOSIS — I498 Other specified cardiac arrhythmias: Principal | ICD-10-CM | POA: Diagnosis present

## 2012-10-29 DIAGNOSIS — F329 Major depressive disorder, single episode, unspecified: Secondary | ICD-10-CM

## 2012-10-29 DIAGNOSIS — D509 Iron deficiency anemia, unspecified: Secondary | ICD-10-CM | POA: Diagnosis present

## 2012-10-29 DIAGNOSIS — T68XXXA Hypothermia, initial encounter: Secondary | ICD-10-CM

## 2012-10-29 DIAGNOSIS — K219 Gastro-esophageal reflux disease without esophagitis: Secondary | ICD-10-CM

## 2012-10-29 DIAGNOSIS — F411 Generalized anxiety disorder: Secondary | ICD-10-CM

## 2012-10-29 DIAGNOSIS — I129 Hypertensive chronic kidney disease with stage 1 through stage 4 chronic kidney disease, or unspecified chronic kidney disease: Secondary | ICD-10-CM | POA: Diagnosis present

## 2012-10-29 DIAGNOSIS — R140 Abdominal distension (gaseous): Secondary | ICD-10-CM

## 2012-10-29 DIAGNOSIS — E039 Hypothyroidism, unspecified: Secondary | ICD-10-CM | POA: Diagnosis present

## 2012-10-29 DIAGNOSIS — K5900 Constipation, unspecified: Secondary | ICD-10-CM | POA: Diagnosis present

## 2012-10-29 DIAGNOSIS — E782 Mixed hyperlipidemia: Secondary | ICD-10-CM

## 2012-10-29 DIAGNOSIS — R42 Dizziness and giddiness: Secondary | ICD-10-CM

## 2012-10-29 DIAGNOSIS — K5730 Diverticulosis of large intestine without perforation or abscess without bleeding: Secondary | ICD-10-CM

## 2012-10-29 DIAGNOSIS — F319 Bipolar disorder, unspecified: Secondary | ICD-10-CM | POA: Diagnosis present

## 2012-10-29 DIAGNOSIS — I10 Essential (primary) hypertension: Secondary | ICD-10-CM

## 2012-10-29 DIAGNOSIS — D126 Benign neoplasm of colon, unspecified: Secondary | ICD-10-CM

## 2012-10-29 DIAGNOSIS — K5909 Other constipation: Secondary | ICD-10-CM | POA: Diagnosis present

## 2012-10-29 DIAGNOSIS — H0520 Unspecified exophthalmos: Secondary | ICD-10-CM

## 2012-10-29 DIAGNOSIS — M479 Spondylosis, unspecified: Secondary | ICD-10-CM

## 2012-10-29 DIAGNOSIS — N19 Unspecified kidney failure: Secondary | ICD-10-CM

## 2012-10-29 DIAGNOSIS — R001 Bradycardia, unspecified: Secondary | ICD-10-CM | POA: Diagnosis present

## 2012-10-29 DIAGNOSIS — M1990 Unspecified osteoarthritis, unspecified site: Secondary | ICD-10-CM

## 2012-10-29 DIAGNOSIS — R079 Chest pain, unspecified: Secondary | ICD-10-CM

## 2012-10-29 DIAGNOSIS — R680 Hypothermia, not associated with low environmental temperature: Secondary | ICD-10-CM

## 2012-10-29 DIAGNOSIS — M169 Osteoarthritis of hip, unspecified: Secondary | ICD-10-CM

## 2012-10-29 LAB — POCT I-STAT, CHEM 8
Chloride: 116 mEq/L — ABNORMAL HIGH (ref 96–112)
Creatinine, Ser: 3.5 mg/dL — ABNORMAL HIGH (ref 0.50–1.10)
Glucose, Bld: 72 mg/dL (ref 70–99)
HCT: 21 % — ABNORMAL LOW (ref 36.0–46.0)
Potassium: 5.1 mEq/L (ref 3.5–5.1)

## 2012-10-29 LAB — CBC
HCT: 21.3 % — ABNORMAL LOW (ref 36.0–46.0)
Hemoglobin: 7 g/dL — ABNORMAL LOW (ref 12.0–15.0)
MCH: 33.2 pg (ref 26.0–34.0)
MCHC: 32.9 g/dL (ref 30.0–36.0)

## 2012-10-29 LAB — GLUCOSE, CAPILLARY: Glucose-Capillary: 82 mg/dL (ref 70–99)

## 2012-10-29 NOTE — ED Provider Notes (Signed)
History    CSN: [id number] Arrival date & time 10/29/12  1624First MD Initiated Contact with Patient 10/29/12 1643     Chief Complaint  Patient presents with  . Fall    Upper back pain   HPI  67 year old female with bipolar 1, hypertension, hx constipation followed by [facility], hypothyroidism presenting after a fall.   Patient was working with PT and was told she was too weak to walk unassisted. As soon as they walked out of the room, patient states she "forgot" what they said and she got up to go to the bathroom. Patient states she lost her balance and fell backwards, hitting her upper back. She denies chest pain, shortness of breath, lightheadedness, palpitations before her fall. No LOC, did not hit head. Her only complaint is some upper back pain with movement but none with palpation.   She had been seen in ED on 6/17 for a mechanical fall where her walker slipped and she fell forward and hit her head. She had a negative CT head for acute injury but there was some stable ventriculomegaly and ? Normal pressure hydrocephalus vs. Dilation due to atrophy.   On ROS-Endorses dysuria/polyuria which she states is improving on Keflex-tx for UTI. Endorses dark black stools over last week or so.   Past Medical History  Diagnosis Date  . Bipolar 1 disorder   . Hypertension   . Esophageal reflux   . Anxiety   . Gout   . Adenomatous polyps 03-27-07  . Diverticulosis   . Internal hemorrhoids   . Hypothyroidism   . Renal insufficiency    Past Surgical History  Procedure Laterality Date  . Cholecystectomy    . Appendectomy    . Tonsillectomy    . Tubal ligation     Family History  Problem Relation Age of Onset  . Hypertension Mother   . Atrial fibrillation Father   . Colon cancer Neg Hx   . Heart disease Mother    History  Substance Use Topics  . Smoking status: Former Smoker    Quit date: 05/08/2003  . Smokeless tobacco: Never Used  . Alcohol Use: No    Review of  Systems A full 10 point review of symptoms was performed and was negative except as noted in HPI.   Allergies  Review of patient's allergies indicates no known allergies.  Home Medications   Current Outpatient Rx  Name  Route  Sig  Dispense  Refill  . acetaminophen (TYLENOL) 500 MG tablet   Oral   Take 1,000 mg by mouth every 4 (four) hours as needed for pain.          Marland Kitchen ALPRAZolam (XANAX) 1 MG tablet   Oral   Take 1 mg by mouth 3 (three) times daily.          Marland Kitchen ALPRAZolam (XANAX) 1 MG tablet   Oral   Take 1 mg by mouth every 6 (six) hours as needed for anxiety.         Marland Kitchen amLODipine (NORVASC) 10 MG tablet   Oral   Take 10 mg by mouth at bedtime.          . cetirizine (ZYRTEC) 10 MG tablet   Oral   Take 10 mg by mouth daily as needed for allergies.          . cyclobenzaprine (FLEXERIL) 5 MG tablet   Oral   Take 5 mg by mouth 3 (three) times daily as needed for  muscle spasms.          Marland Kitchen docusate sodium (COLACE) 100 MG capsule   Oral   Take 200-300 mg by mouth 2 (two) times daily. 3 caps in am, 2 caps in pm         . famotidine (PEPCID) 20 MG tablet   Oral   Take 20 mg by mouth 2 (two) times daily.           Marland Kitchen guaifenesin (ROBITUSSIN) 100 MG/5ML syrup   Oral   Take 200 mg by mouth 4 (four) times daily as needed for cough.          . labetalol (NORMODYNE) 200 MG tablet   Oral   Take 200 mg by mouth 2 (two) times daily.           Marland Kitchen lamoTRIgine (LAMICTAL) 200 MG tablet   Oral   Take 200 mg by mouth 2 (two) times daily.          Marland Kitchen levothyroxine (SYNTHROID, LEVOTHROID) 75 MCG tablet   Oral   Take 75 mcg by mouth daily before breakfast.         . lubiprostone (AMITIZA) 24 MCG capsule   Oral   Take 24 mcg by mouth 2 (two) times daily with a meal.         . magnesium hydroxide (MILK OF MAGNESIA) 400 MG/5ML suspension   Oral   Take 30-60 mLs by mouth daily as needed for constipation.         . meclizine (ANTIVERT) 25 MG tablet    Oral   Take 1 tablet (25 mg total) by mouth 3 (three) times daily as needed for dizziness.   30 tablet   0   . omega-3 acid ethyl esters (LOVAZA) 1 G capsule   Oral   Take 4 g by mouth daily.          . Oxcarbazepine (TRILEPTAL) 300 MG tablet   Oral   Take 900 mg by mouth 2 (two) times daily.          . polyethylene glycol (MIRALAX / GLYCOLAX) packet   Oral   Take 17 g by mouth daily.         . risperiDONE (RISPERDAL) 2 MG tablet   Oral   Take 4 mg by mouth 2 (two) times daily.          . sodium phosphate (FLEET) enema   Rectal   Place 1 enema rectally as needed (for constipation, may self-administer.).          Marland Kitchen traMADol (ULTRAM) 50 MG tablet   Oral   Take 50 mg by mouth 2 (two) times daily.          BP 130/56  Pulse 49  Temp(Src) 97.4 F (36.3 C) (Oral)  Resp 20  SpO2 98% Physical Exam  Constitutional: She is oriented to person, place, and time. She appears well-developed and well-nourished.  Severe kyphosis noted   HENT:  Head: Normocephalic and atraumatic.  Mouth/Throat: Oropharynx is clear and moist.  Eyes: EOM are normal. Pupils are equal, round, and reactive to light.  Neck: Normal range of motion. Neck supple.  Cardiovascular: Regular rhythm.   Murmur (3/6 SEM radiates to carotids) heard. bradycardic  Pulmonary/Chest: Effort normal and breath sounds normal. She has no wheezes. She has no rales.  Abdominal: Soft. Bowel sounds are normal. There is no tenderness. There is no rebound and no guarding.  Musculoskeletal: Normal range of motion. She exhibits  no edema and no tenderness (no tenderness to palpation of upper back where she has pain with movement).  Neurological: She is alert and oriented to person, place, and time. No cranial nerve deficit. She exhibits normal muscle tone. Coordination normal.  5/5 strength upper and lower extremities. Intact distal sensation.   Skin: Skin is warm and dry.    ED EKG  Date: 10/30/2012  Rate: 42   Rhythm: sinus bradycardia, although ectopic atrial bradycardia possibility  QRS Axis: normal  Intervals: PR prolonged  ST/T Wave abnormalities: nonspecific ST changes, inverted t wave avr and avl unchanged from previous  Conduction Disutrbances:first-degree A-V block   Narrative Interpretation: sinus brady vs ectopic atrial bradycardia  Old EKG Reviewed: unchanged from 07/06/07    ED Course  Procedures (including critical care time) Labs Reviewed  TSH - Abnormal; Notable for the following:    TSH 0.125 (*)    All other components within normal limits  CBC - Abnormal; Notable for the following:    WBC 3.2 (*)    RBC 2.11 (*)    Hemoglobin 7.0 (*)    HCT 21.3 (*)    MCV 100.9 (*)    RDW 16.4 (*)    Platelets 115 (*)    All other components within normal limits  POCT I-STAT, CHEM 8 - Abnormal; Notable for the following:    Chloride 116 (*)    BUN 75 (*)    Creatinine, Ser 3.50 (*)    Hemoglobin 7.1 (*)    HCT 21.0 (*)    All other components within normal limits  GLUCOSE, CAPILLARY  URINALYSIS, ROUTINE W REFLEX MICROSCOPIC  OCCULT BLOOD, POC DEVICE   No results found. 1. Bradycardia   2. Hypothermia due to non-environmental cause, initial encounter     MDM  67 year old female with bipolar 1, hypertension, hx constipation followed by [facility], hypothyroidism presenting after a fall.   Patient noted to be hypothermic (resolved with extra blankets), bradycardic ranging from 30s-50s but asymptomatic, anemic to 7.0 down from baseline 8, with Cr 3.5 up from baseline 2.5 (possibly related to recent UTI?), and with 2nd fall in last 10 days. Given constellation of symptoms, believe patient would benefit from admission for monitoring. Hgb may be explained by CKD and lack of erythropoietin. Patient was hemocult negative but may need EGD given reported melena.  Bradycardia and hypothermia (along with chronic constipation) may be signs of hypothyroidism inadequately treated-odd that TSH  0.125 but may be due to ? Acute illness.    Will admit to [facility]. They will see her and place admission orders.   Shelva Majestic, MD 10/30/12 9604  Shelva Majestic, MD 10/30/12 5409  Shelva Majestic, MD 11/03/12 2252

## 2012-10-29 NOTE — ED Notes (Signed)
In spite of her cardiac monitor showing at times a heart rate in the low 30's and even high 20's; pt. Remains in no distress; and answers questions lucidly with clear speech.  She is also able to follow all commands with all extremities with ease.  Her skin is slightly pale, warm and dry and she is breathing normally.

## 2012-10-29 NOTE — ED Notes (Signed)
It was reported by staff at [facility] that pt. Last her balance and fell backward while getting up out of her recliner and fell backward.  She c/o upper back discomfort.  She is in no distress.

## 2012-10-29 NOTE — ED Notes (Signed)
Pt placed on bedpan.  She felt she had urinated and possibly had a BM.  No BM or urine at all in bedpan when I removed the pt from the bedpan.

## 2012-10-29 NOTE — ED Notes (Signed)
ZOX:[id number]<AV> Expected date:<BR> Expected time:<BR> Means of arrival:<BR> Comments:<BR> Fall from chair

## 2012-10-29 NOTE — ED Notes (Signed)
Due to pt's physical limitations-pt unable to obtain a clean catch urine specimen-will in and out cath per protocol.

## 2012-10-29 NOTE — H&P (Signed)
[facility] History and Physical  [name] AVW:[id number] DOB: 03-18-1946    PCP:   Pamelia Hoit, MD   Chief Complaint: Fall.  HPI: [name] is an 67 y.o. female with hx of constipation, HTN, Bipolar disorder, unsteady gait, fallen recently, presents to the ER as she fell again tonight. She also has been constipated, followed by [facility].  She denied chest pain, shortness of breath.  She had a mechanical fall 6/17, and at that time, had a head CT which showed stable ventriculomegaly raising ? Of NPH versus atrophic dilatation.  She also was found to have AKI with Cr of 3.5, and Hb of 7.1g/DL, no recent Hb to compare.   In the ER, she was found to have hypothermia with T of 47F.   She also has SB with HR of 50, and occasionally in the high 40's asymtomatically.  She is alert, awake and conversing.  Hospitalist was asked to admit her for bradycardia, anemia, worsening CKD.  Rewiew of Systems:  Constitutional: Negative for malaise, fever and chills. No significant weight loss or weight gain Eyes: Negative for eye pain, redness and discharge, diplopia, visual changes, or flashes of light. ENMT: Negative for ear pain, hoarseness, nasal congestion, sinus pressure and sore throat. No headaches; tinnitus, drooling, or problem swallowing. Cardiovascular: Negative for chest pain, palpitations, diaphoresis, dyspnea and peripheral edema. ; No orthopnea, PND Respiratory: Negative for cough, hemoptysis, wheezing and stridor. No pleuritic chestpain. Gastrointestinal: Negative for nausea, vomiting, diarrhea, abdominal pain, melena, blood in stool, hematemesis, jaundice and rectal bleeding.    Genitourinary: Negative for frequency, dysuria, incontinence,flank pain and hematuria; Musculoskeletal: Negative for back pain and neck pain. Negative for swelling and trauma.;  Skin: . Negative for pruritus, rash, abrasions, bruising and skin lesion.; ulcerations Neuro: Negative for headache,  lightheadedness and neck stiffness. Negative for weakness, altered level of consciousness , altered mental status, extremity weakness, burning feet, involuntary movement, seizure and syncope.  Psych: negative for anxiety, depression, insomnia, tearfulness, panic attacks, hallucinations, paranoia, suicidal or homicidal ideation    Past Medical History  Diagnosis Date  . Constipation   . Bipolar 1 disorder   . Hypertension   . Esophageal reflux   . Anxiety   . Back pain   . Osteoarthritis   . Gout   . Adenomatous polyps 03-27-07  . Diverticulosis   . Internal hemorrhoids   . Hypothyroidism   . Renal insufficiency     Past Surgical History  Procedure Laterality Date  . Cholecystectomy    . Appendectomy    . Tonsillectomy    . Tubal ligation      Medications:  HOME MEDS: Prior to Admission medications   Medication Sig Start Date End Date Taking? Authorizing Provider  acetaminophen (TYLENOL) 500 MG tablet Take 1,000 mg by mouth every 4 (four) hours as needed for pain.    Yes Historical Provider, MD  ALPRAZolam Prudy Feeler) 1 MG tablet Take 1 mg by mouth 3 (three) times daily.    Yes Historical Provider, MD  ALPRAZolam Prudy Feeler) 1 MG tablet Take 1 mg by mouth every 6 (six) hours as needed for anxiety.   Yes Historical Provider, MD  amLODipine (NORVASC) 10 MG tablet Take 10 mg by mouth at bedtime.    Yes Historical Provider, MD  cetirizine (ZYRTEC) 10 MG tablet Take 10 mg by mouth daily as needed for allergies.    Yes Historical Provider, MD  cyclobenzaprine (FLEXERIL) 5 MG tablet Take 5 mg by mouth 3 (  three) times daily as needed for muscle spasms.    Yes Historical Provider, MD  docusate sodium (COLACE) 100 MG capsule Take 200-300 mg by mouth 2 (two) times daily. 3 caps in am, 2 caps in pm   Yes Historical Provider, MD  famotidine (PEPCID) 20 MG tablet Take 20 mg by mouth 2 (two) times daily.     Yes Historical Provider, MD  guaifenesin (ROBITUSSIN) 100 MG/5ML syrup Take 200 mg by mouth  4 (four) times daily as needed for cough.    Yes Historical Provider, MD  labetalol (NORMODYNE) 200 MG tablet Take 200 mg by mouth 2 (two) times daily.     Yes Historical Provider, MD  lamoTRIgine (LAMICTAL) 200 MG tablet Take 200 mg by mouth 2 (two) times daily.    Yes Historical Provider, MD  levothyroxine (SYNTHROID, LEVOTHROID) 75 MCG tablet Take 75 mcg by mouth daily before breakfast.   Yes Historical Provider, MD  lubiprostone (AMITIZA) 24 MCG capsule Take 24 mcg by mouth 2 (two) times daily with a meal.   Yes Historical Provider, MD  magnesium hydroxide (MILK OF MAGNESIA) 400 MG/5ML suspension Take 30-60 mLs by mouth daily as needed for constipation.   Yes Historical Provider, MD  meclizine (ANTIVERT) 25 MG tablet Take 1 tablet (25 mg total) by mouth 3 (three) times daily as needed for dizziness. 06/11/12  Yes John L Molpus, MD  omega-3 acid ethyl esters (LOVAZA) 1 G capsule Take 4 g by mouth daily.    Yes Historical Provider, MD  Oxcarbazepine (TRILEPTAL) 300 MG tablet Take 900 mg by mouth 2 (two) times daily.    Yes Historical Provider, MD  polyethylene glycol (MIRALAX / GLYCOLAX) packet Take 17 g by mouth daily.   Yes Historical Provider, MD  risperiDONE (RISPERDAL) 2 MG tablet Take 4 mg by mouth 2 (two) times daily.    Yes Historical Provider, MD  sodium phosphate (FLEET) enema Place 1 enema rectally as needed (for constipation, may self-administer.).    Yes Historical Provider, MD  traMADol (ULTRAM) 50 MG tablet Take 50 mg by mouth 2 (two) times daily.   Yes Historical Provider, MD     Allergies:  No Known Allergies  Social History:   reports that she quit smoking about 9 years ago. She has never used smokeless tobacco. She reports that she does not drink alcohol or use illicit drugs.  Family History: Family History  Problem Relation Age of Onset  . Hypertension Mother   . Atrial fibrillation Father   . Colon cancer Neg Hx   . Heart disease Mother      Physical Exam: Filed  Vitals:   10/29/12 1633 10/29/12 1700 10/29/12 1913 10/29/12 2136  BP: 130/56   144/59  Pulse: 49   58  Temp: 94.5 F (34.7 C) 94.3 F (34.6 C) 97.4 F (36.3 C) 97.5 F (36.4 C)  TempSrc: Oral Rectal Oral Oral  Resp: 20   15  SpO2: 98%   97%   Blood pressure 144/59, pulse 58, temperature 97.5 F (36.4 C), temperature source Oral, resp. rate 15, SpO2 97.00%.  GEN:  Pleasant  patient lying in the stretcher in no acute distress; cooperative with exam. PSYCH:  alert and oriented x4; does not appear anxious or depressed; affect is appropriate. HEENT: Mucous membranes pink and anicteric; PERRLA; EOM intact; no cervical lymphadenopathy nor thyromegaly or carotid bruit; no JVD; There were no stridor. Neck is very supple. Breasts:: Not examined CHEST WALL: No tenderness CHEST: Normal respiration, clear  to auscultation bilaterally.  HEART: Regular rate and rhythm.  There are no murmur, rub, or gallops.   BACK: No kyphosis or scoliosis; no CVA tenderness ABDOMEN: soft and non-tender; no masses, no organomegaly, normal abdominal bowel sounds; no pannus; no intertriginous candida. There is no rebound and no distention. Rectal Exam: Not done EXTREMITIES: No bone or joint deformity; age-appropriate arthropathy of the hands and knees; no edema; no ulcerations.  There is no calf tenderness. Genitalia: not examined PULSES: 2+ and symmetric SKIN: Normal hydration no rash or ulceration CNS: Cranial nerves 2-12 grossly intact no focal lateralizing neurologic deficit.  Speech is fluent; uvula elevated with phonation, facial symmetry and tongue midline. DTR are normal bilaterally, cerebella exam is intact, barbinski is negative and strengths are equaled bilaterally.  No sensory loss.   Labs on Admission:  Basic Metabolic Panel:  Recent Labs Lab 10/29/12 1850  NA 138  K 5.1  CL 116*  GLUCOSE 72  BUN 75*  CREATININE 3.50*   Liver Function Tests: No results found for this basename: AST, ALT,  ALKPHOS, BILITOT, PROT, ALBUMIN,  in the last 168 hours No results found for this basename: LIPASE, AMYLASE,  in the last 168 hours No results found for this basename: AMMONIA,  in the last 168 hours CBC:  Recent Labs Lab 10/29/12 1850 10/29/12 1900  WBC  --  3.2*  HGB 7.1* 7.0*  HCT 21.0* 21.3*  MCV  --  100.9*  PLT  --  115*   Cardiac Enzymes: No results found for this basename: CKTOTAL, CKMB, CKMBINDEX, TROPONINI,  in the last 168 hours  CBG:  Recent Labs Lab 10/29/12 1650  GLUCAP 82     Radiological Exams on Admission: No results found.   Assessment/Plan Present on Admission:  . Bradycardia . HYPOTHYROIDISM . RENAL INSUFFICIENCY, CHRONIC . CONSTIPATION, CHRONIC . EMPHYSEMA . RENAL FAILURE . ANEMIA, IRON DEFICIENCY  PLAN:  With bradycardia, hypothermia, and constipation, I wonder if she is hypothyroidism.  Her TSH is suppress, but will obtain T3, free T4 as she could have hypopituatary hypothyroidism.  I will keep her at the same synthroid dose.  She also has elevated Cr, which I suspect is dehydration, and will give her gentle IVF as she has hx of CHF.  Her constipation is old and will continue her regimen.  Her anemia could be from anemia of CKD, or she could have slow GI bleed.  Her bradycardia could be from hypothermia, hypothyroidism, or Labetelol induced.  I will place her on telemetry and stop Labetelol.  She is stable, full code, and will be admitted to [facility] service.  Thank you for allowing me to partake in the care of your nice patient.  Other plans as per orders.  Code Status: FULL Unk Lightning, MD. [facility] Pager [phone] 7pm to 7am.  10/29/2012, 11:26 PM

## 2012-10-29 NOTE — ED Notes (Signed)
Attempted to in and out cath patient.  Pt refused.

## 2012-10-30 ENCOUNTER — Encounter (HOSPITAL_COMMUNITY): Payer: Self-pay | Admitting: *Deleted

## 2012-10-30 DIAGNOSIS — F411 Generalized anxiety disorder: Secondary | ICD-10-CM

## 2012-10-30 DIAGNOSIS — D126 Benign neoplasm of colon, unspecified: Secondary | ICD-10-CM

## 2012-10-30 DIAGNOSIS — R143 Flatulence: Secondary | ICD-10-CM

## 2012-10-30 LAB — BASIC METABOLIC PANEL
Chloride: 112 mEq/L (ref 96–112)
GFR calc Af Amer: 16 mL/min — ABNORMAL LOW (ref >90)
GFR calc non Af Amer: 14 mL/min — ABNORMAL LOW (ref >90)
Glucose, Bld: 132 mg/dL — ABNORMAL HIGH (ref 70–99)
Potassium: 4.6 mEq/L (ref 3.5–5.1)
Sodium: 139 mEq/L (ref 135–145)

## 2012-10-30 LAB — URINALYSIS, ROUTINE W REFLEX MICROSCOPIC
Glucose, UA: NEGATIVE mg/dL
Ketones, ur: NEGATIVE mg/dL
Specific Gravity, Urine: 1.01 (ref 1.005–1.030)
pH: 6 (ref 5.0–8.0)

## 2012-10-30 LAB — CBC
HCT: 23 % — ABNORMAL LOW (ref 36.0–46.0)
Hemoglobin: 7.4 g/dL — ABNORMAL LOW (ref 12.0–15.0)
RBC: 2.28 MIL/uL — ABNORMAL LOW (ref 3.87–5.11)

## 2012-10-30 LAB — URINE MICROSCOPIC-ADD ON

## 2012-10-30 LAB — T3: T3, Total: 33 ng/dl — ABNORMAL LOW (ref 80.0–204.0)

## 2012-10-30 LAB — TSH: TSH: 0.101 u[IU]/mL — ABNORMAL LOW (ref 0.350–4.500)

## 2012-10-30 LAB — T4, FREE: Free T4: 0.49 ng/dL — ABNORMAL LOW (ref 0.80–1.80)

## 2012-10-30 MED ORDER — LAMOTRIGINE 200 MG PO TABS
200.0000 mg | ORAL_TABLET | Freq: Two times a day (BID) | ORAL | Status: DC
  Administered 2012-10-30 – 2012-11-03 (×10): 200 mg via ORAL
  Filled 2012-10-30 (×14): qty 1

## 2012-10-30 MED ORDER — OMEGA-3-ACID ETHYL ESTERS 1 G PO CAPS
4.0000 g | ORAL_CAPSULE | Freq: Every day | ORAL | Status: DC
  Administered 2012-10-30 – 2012-11-03 (×2): 4 g via ORAL
  Filled 2012-10-30 (×5): qty 4

## 2012-10-30 MED ORDER — LUBIPROSTONE 24 MCG PO CAPS
24.0000 ug | ORAL_CAPSULE | Freq: Two times a day (BID) | ORAL | Status: DC
  Administered 2012-10-31 (×2): 24 ug via ORAL
  Filled 2012-10-30 (×11): qty 1

## 2012-10-30 MED ORDER — AMLODIPINE BESYLATE 10 MG PO TABS
10.0000 mg | ORAL_TABLET | Freq: Every day | ORAL | Status: DC
  Administered 2012-10-30 – 2012-11-02 (×5): 10 mg via ORAL
  Filled 2012-10-30 (×6): qty 1

## 2012-10-30 MED ORDER — MAGNESIUM HYDROXIDE 400 MG/5ML PO SUSP
30.0000 mL | Freq: Every day | ORAL | Status: DC | PRN
  Administered 2012-11-02: 30 mL via ORAL
  Filled 2012-10-30: qty 60

## 2012-10-30 MED ORDER — CEFTRIAXONE SODIUM 1 G IJ SOLR
1.0000 g | INTRAMUSCULAR | Status: DC
  Administered 2012-10-30 – 2012-11-01 (×3): 1 g via INTRAVENOUS
  Filled 2012-10-30 (×4): qty 10

## 2012-10-30 MED ORDER — FAMOTIDINE 20 MG PO TABS
20.0000 mg | ORAL_TABLET | Freq: Two times a day (BID) | ORAL | Status: DC
  Administered 2012-10-30 – 2012-11-02 (×7): 20 mg via ORAL
  Filled 2012-10-30 (×8): qty 1

## 2012-10-30 MED ORDER — OXCARBAZEPINE 300 MG PO TABS
900.0000 mg | ORAL_TABLET | Freq: Two times a day (BID) | ORAL | Status: DC
  Administered 2012-10-30 – 2012-11-03 (×10): 900 mg via ORAL
  Filled 2012-10-30 (×11): qty 3

## 2012-10-30 MED ORDER — SODIUM CHLORIDE 0.9 % IJ SOLN
3.0000 mL | Freq: Two times a day (BID) | INTRAMUSCULAR | Status: DC
Start: 2012-10-30 — End: 2012-11-03
  Administered 2012-10-30 – 2012-11-03 (×4): 3 mL via INTRAVENOUS

## 2012-10-30 MED ORDER — ACETAMINOPHEN 500 MG PO TABS
1000.0000 mg | ORAL_TABLET | ORAL | Status: DC | PRN
  Administered 2012-10-30 – 2012-10-31 (×5): 1000 mg via ORAL
  Filled 2012-10-30 (×5): qty 2

## 2012-10-30 MED ORDER — DEXTROSE-NACL 5-0.9 % IV SOLN
INTRAVENOUS | Status: DC
  Administered 2012-10-30 – 2012-11-01 (×4): via INTRAVENOUS

## 2012-10-30 MED ORDER — HEPARIN SODIUM (PORCINE) 5000 UNIT/ML IJ SOLN
5000.0000 [IU] | Freq: Three times a day (TID) | INTRAMUSCULAR | Status: DC
  Administered 2012-10-30 – 2012-11-03 (×13): 5000 [IU] via SUBCUTANEOUS
  Filled 2012-10-30 (×17): qty 1

## 2012-10-30 MED ORDER — POLYETHYLENE GLYCOL 3350 17 G PO PACK
17.0000 g | PACK | Freq: Every day | ORAL | Status: DC
  Administered 2012-11-02: 17 g via ORAL
  Filled 2012-10-30 (×5): qty 1

## 2012-10-30 MED ORDER — LEVOTHYROXINE SODIUM 75 MCG PO TABS
75.0000 ug | ORAL_TABLET | Freq: Every day | ORAL | Status: DC
  Administered 2012-10-30: 75 ug via ORAL
  Filled 2012-10-30 (×3): qty 1

## 2012-10-30 MED ORDER — LORATADINE 10 MG PO TABS
10.0000 mg | ORAL_TABLET | Freq: Every day | ORAL | Status: DC
  Administered 2012-10-30 – 2012-11-03 (×5): 10 mg via ORAL
  Filled 2012-10-30 (×5): qty 1

## 2012-10-30 MED ORDER — LEVOTHYROXINE SODIUM 25 MCG PO TABS
25.0000 ug | ORAL_TABLET | Freq: Every day | ORAL | Status: DC
  Administered 2012-10-31 – 2012-11-03 (×4): 25 ug via ORAL
  Filled 2012-10-30 (×6): qty 1

## 2012-10-30 MED ORDER — DOCUSATE SODIUM 100 MG PO CAPS
300.0000 mg | ORAL_CAPSULE | Freq: Every day | ORAL | Status: DC
  Administered 2012-10-30 – 2012-10-31 (×2): 300 mg via ORAL
  Filled 2012-10-30 (×5): qty 3

## 2012-10-30 MED ORDER — LEVOTHYROXINE SODIUM 100 MCG PO TABS
100.0000 ug | ORAL_TABLET | Freq: Every day | ORAL | Status: DC
  Filled 2012-10-30 (×2): qty 1

## 2012-10-30 MED ORDER — SODIUM CHLORIDE 0.9 % IV SOLN
INTRAVENOUS | Status: DC
  Administered 2012-10-30 – 2012-11-01 (×2): via INTRAVENOUS

## 2012-10-30 MED ORDER — RISPERIDONE 2 MG PO TABS
4.0000 mg | ORAL_TABLET | Freq: Two times a day (BID) | ORAL | Status: DC
  Administered 2012-10-30 – 2012-11-03 (×10): 4 mg via ORAL
  Filled 2012-10-30 (×12): qty 2

## 2012-10-30 MED ORDER — FLEET ENEMA 7-19 GM/118ML RE ENEM: 1.0000 | ENEMA | RECTAL | Status: DC | PRN

## 2012-10-30 MED ORDER — ALPRAZOLAM 0.25 MG PO TABS
0.2500 mg | ORAL_TABLET | Freq: Two times a day (BID) | ORAL | Status: DC | PRN
  Administered 2012-10-30 – 2012-11-03 (×8): 0.25 mg via ORAL
  Filled 2012-10-30 (×9): qty 1

## 2012-10-30 NOTE — Clinical Social Work Psychosocial (Unsigned)
     Clinical Social Work Department BRIEF PSYCHOSOCIAL ASSESSMENT 10/30/2012  Patient:  [name], [name]     Account Number:  [id number]     Admit date:  10/29/2012  Clinical Social Worker:  Hattie Perch  Date/Time:  10/30/2012 12:00 M  Referred by:  Physician  Date Referred:  10/30/2012 Referred for  ALF Placement   Other Referral:   Interview type:  Other - See comment Other interview type:   facility    PSYCHOSOCIAL DATA Living Status:  FACILITY Admitted from facility:  [facility] Level of care:  Assisted Living Primary support name:  Renaye Rakers Primary support relationship to patient:  CHILD, ADULT Degree of support available:   fair    CURRENT CONCERNS Current Concerns  Post-Acute Placement   Other Concerns:    SOCIAL WORK ASSESSMENT / PLAN CSW met with patient. patient was sleeping. CSW called patient's daughter, no answer, left voicemail. CSW spoke with Scientist, research (medical) at [facility]. she confirms that patient is a long term resident and they are agreeable to take her back upon discharge.   Assessment/plan status:   Other assessment/ plan:   Information/referral to community resources:    PATIENTS/FAMILYS RESPONSE TO PLAN OF CARE:

## 2012-10-30 NOTE — Plan of Care (Signed)
Problem: Phase I Progression Outcomes Goal: OOB as tolerated unless otherwise ordered Outcome: Progressing Pt encourage to call for assistance when needing to get out of bed. Bed alarm on. Goal: Initial discharge plan identified Outcome: Progressing Pt wishes to go back to assistant living, but may not be able to because of her falling several times in the best month. She may need skilled nursing or more PT. Goal: Voiding-avoid urinary catheter unless indicated Outcome: Not Applicable Date Met:  10/30/12 Pt voiding without difficulty.

## 2012-10-30 NOTE — Progress Notes (Signed)
Patient ID: [name], female   DOB: 01/28/1946, 67 y.o.   MRN: [id number] [facility] PROGRESS NOTE  [name] HQI:[id number] DOB: Jan 15, 1946 DOA: 10/29/2012 PCP: Pamelia Hoit, MD  Brief narrative: Pt is 67 y.o. female with hx of constipation, HTN, Bipolar disorder, unsteady gait, fallen recently, presents to the ER as she fell again just prior to coming to ED. She also has been constipated, followed by [facility]. She denied chest pain, shortness of breath. She had a mechanical fall 6/17, and at that time, had a head CT which showed stable ventriculomegaly raising ? NPH versus atrophic dilatation. She also was found to have AKI with Cr of 3.5, and Hb of 7.1g/DL, no recent Hb to compare. In the ER, she was found to have hypothermia with T of 75F. She also has SB with HR of 50, and occasionally in the high 40's asymtomatically. She is alert, awake and conversing. Hospitalist was asked to admit her for bradycardia, anemia, worsening CKD.  Principal Problem:   Bradycardia - unclear etiology, will continue to monitor on telemetry unit - TSH is low and will therefore need to lower synthroid dose to 25 mcg PO QD - needs repeat TSH in 6 weeks  - HR better controlled but may need cardiology consultation in [place] if bradycardia persists  Active Problems:   HYPOTHYROIDISM - TSH too low, will need to lower the dose of Synthroid    ANEMIA, IRON DEFICIENCY - per review of records, baseline is 8-9 - will repeat again Hg in AM and will plan on transfusing two units if still low    EMPHYSEMA - oxygen saturations stable   CONSTIPATION, CHRONIC - continue bowel regimen    RENAL FAILURE, acute on chronic, stage III-IV - review of records indicate that baseline creatinine is ~ 2.7 - continue IVF for now and repeat BMP in AM   Hypothermia due to non-environmental cause - possible SIRS, UA worrisome for UTI, will treat with empiric Rocephin  - urine cultures pending    Consultants:  None Procedures/Studies:  None Antibiotics:  None  Code Status: Full Family Communication: Pt at bedside Disposition Plan: Home when medically stable  HPI/Subjective: No events overnight.   Objective: Filed Vitals:   10/29/12 2342 10/30/12 0120 10/30/12 0217 10/30/12 0555  BP: 144/55 145/61 145/61 140/44  Pulse: 59 75  73  Temp: 97.4 F (36.3 C) 98.1 F (36.7 C)  98.3 F (36.8 C)  TempSrc: Oral Oral  Oral  Resp: 15 16  20   Height:  5\' 6"  (1.676 m)    Weight:  68.9 kg (151 lb 14.4 oz)    SpO2: 95% 96%  97%    Intake/Output Summary (Last 24 hours) at 10/30/12 0915 Last data filed at 10/30/12 0734  Gross per 24 hour  Intake      0 ml  Output   1800 ml  Net  -1800 ml    Exam:   General:  Pt is alert, follows some commands appropriately, not in acute distress  Cardiovascular: Regular rate and rhythm, S1/S2, no murmurs, no rubs, no gallops  Respiratory: Clear to auscultation bilaterally, decreased breath sounds at bases   Abdomen: Soft, non tender, non distended, bowel sounds present, no guarding  Extremities: No edema, pulses DP and PT palpable bilaterally  Neuro: Grossly nonfocal  Data Reviewed: Basic Metabolic Panel:  Recent Labs Lab 10/29/12 1850 10/30/12 0436  NA 138 139  K 5.1 4.6  CL 116* 112  CO2  --  15*  GLUCOSE 72 132*  BUN 75* 60*  CREATININE 3.50* 3.30*  CALCIUM  --  8.7   CBC:  Recent Labs Lab 10/29/12 1850 10/29/12 1900 10/30/12 0436  WBC  --  3.2* 3.1*  HGB 7.1* 7.0* 7.4*  HCT 21.0* 21.3* 23.0*  MCV  --  100.9* 100.9*  PLT  --  115* 142*   CBG:  Recent Labs Lab 10/29/12 1650  GLUCAP 82   Scheduled Meds: . amLODipine  10 mg Oral QHS  . docusate sodium  300 mg Oral Daily  . famotidine  20 mg Oral BID  . heparin  5,000 Units Subcutaneous Q8H  . lamoTRIgine  200 mg Oral BID  . levothyroxine  75 mcg Oral QAC breakfast  . loratadine  10 mg Oral Daily  . polyethylene glycol  17 g Oral Daily  .  risperiDONE  4 mg Oral BID   Continuous Infusions: . dextrose 5 % and 0.9% NaCl 100 mL/hr at 10/30/12 8295   Debbora Presto, MD  [facility] Pager [phone]  If 7PM-7AM, please contact night-coverage [email or web address] Password [email or web address] 10/30/2012, 9:15 AM   LOS: 1 day

## 2012-10-31 LAB — CBC
HCT: 21.3 % — ABNORMAL LOW (ref 36.0–46.0)
Hemoglobin: 6.9 g/dL — CL (ref 12.0–15.0)
RBC: 2.1 MIL/uL — ABNORMAL LOW (ref 3.87–5.11)
RDW: 16.7 % — ABNORMAL HIGH (ref 11.5–15.5)
WBC: 3.1 10*3/uL — ABNORMAL LOW (ref 4.0–10.5)

## 2012-10-31 LAB — BASIC METABOLIC PANEL
Chloride: 112 mEq/L (ref 96–112)
GFR calc Af Amer: 19 mL/min — ABNORMAL LOW (ref >90)
Potassium: 4.9 mEq/L (ref 3.5–5.1)
Sodium: 138 mEq/L (ref 135–145)

## 2012-10-31 MED ORDER — LEVOTHYROXINE SODIUM 25 MCG PO TABS: 75.0000 ug | ORAL_TABLET | Freq: Every day | ORAL | Status: DC

## 2012-10-31 MED ORDER — ACETAMINOPHEN 500 MG PO TABS
1000.0000 mg | ORAL_TABLET | ORAL | Status: DC | PRN
  Administered 2012-11-01 – 2012-11-03 (×10): 1000 mg via ORAL
  Filled 2012-10-31 (×10): qty 2

## 2012-10-31 NOTE — Progress Notes (Signed)
Pt's pre-blood temperature 99.2, MD notified. Will continue to monitor.  Earnest Conroy. Clelia Croft, RN

## 2012-10-31 NOTE — Evaluation (Signed)
Physical Therapy Evaluation Patient Details Name: [name] MRN: [id number] DOB: 1945-11-18 Today's Date: 10/31/2012 Time: 4098-1191 PT Time Calculation (min): 39 min  PT Assessment / Plan / Recommendation History of Present Illness  67 y.o. female admitted to [facility] for fall, constipation, bradycardia, anemia, hypothermia, and CKD.    Clinical Impression  She presents today generally weak and unbalance with gait and mobility requiring constant hands on assist any time she is on her feet.  As long as ALF knows that she is requiring more assist and she has therapy f/u at discharge, I think she would be ok to return to ALF.  If she continues to fall even after anemia resolves, she may be ready for a higher level of care (SNF placement).      PT Assessment  Patient needs continued PT services    Follow Up Recommendations  Other (comment);Home health PT (ALF with PT f/u at ALF)    Does the patient have the potential to tolerate intense rehabilitation     NA  Barriers to Discharge   none      Equipment Recommendations  None recommended by PT    Recommendations for Other Services   none  Frequency Min 3X/week    Precautions / Restrictions Precautions Precautions: Fall Precaution Comments: low HGB (6.9) per chart pt refusing transfusion, but RNs at end of session bringing blood in (per pt she talked to her sister (an Charity fundraiser) and is now agreeable to getting blood).     Pertinent Vitals/Pain See vitals flow sheet.       Mobility  Bed Mobility Bed Mobility: Supine to Sit;Sitting - Scoot to [place] of Bed;Sit to Supine Supine to Sit: 4: Min assist;With rails;HOB elevated Sitting - Scoot to [place] of Bed: 4: Min assist;With rail Sit to Supine: 3: Mod assist;With rail;HOB elevated;HOB flat Details for Bed Mobility Assistance: min assist to support trunk to get to EOB.  Pt relying heavily on upper extremity support.  Mod assist to lift both legs back into the bed against gravity.    Transfers Transfers: Sit to Stand;Stand to [employer] Transfers Sit to Stand: 4: Min assist;With upper extremity assist;From bed;From chair/3-in-1 Stand to Sit: 4: Min assist;With upper extremity assist;To bed;To chair/3-in-1 Stand Pivot Transfers: 4: Min assist Details for Transfer Assistance: Min assist to support trunk for balance over weak feet.  I agree with pt, I believe that her balance deficits are tied in with her posture.   Ambulation/Gait Ambulation/Gait Assistance: 4: Min assist Ambulation Distance (Feet): 10 Feet Assistive device: 4-wheeled walker Ambulation/Gait Assistance Details: min assist to support trunk for balance.  Pt had significant difficulty backing up to the chair.  Did not walk further because RNs were there to set up her blood.   Gait Pattern: Step-through pattern;Shuffle;Trunk flexed Gait velocity: less than 1.8 ft/sec indicating high risk for falls        PT Diagnosis: Abnormality of gait;Difficulty walking;Generalized weakness  PT Problem List: Decreased strength;Decreased activity tolerance;Decreased balance;Decreased mobility;Decreased cognition;Decreased knowledge of use of DME;Pain PT Treatment Interventions: DME instruction;Gait training;Functional mobility training;Balance training;Therapeutic exercise;Therapeutic activities;Neuromuscular re-education;Patient/family education;Cognitive remediation     PT Goals(Current goals can be found in the care plan section) Acute Rehab PT Goals Patient Stated Goal: to stop falling PT Goal Formulation: With patient Time For Goal Achievement: 11/14/12 Potential to Achieve Goals: Good  Visit Information  Last PT Received On: 10/31/12 Assistance Needed: +1 History of Present Illness: 67 y.o. female admitted to [facility] for fall, constipation,  bradycardia, anemia, hypothermia, and CKD.         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     Balance Static Sitting Balance Static Sitting - Balance Support: Bilateral upper extremity supported;Feet supported Static Sitting - Level of Assistance: 5: Stand by assistance Static Standing Balance Static Standing - Balance Support: Bilateral upper extremity supported Static Standing - Level of Assistance: 5: Stand by assistance Dynamic Standing Balance Dynamic Standing - Balance Support: Bilateral upper extremity supported Dynamic Standing - Level of Assistance: 4: Min assist  End of Session PT - End of Session Equipment Utilized During Treatment: Gait belt Activity Tolerance: Patient limited by fatigue;Patient limited by pain Patient left: in bed;with call bell/phone within reach;with nursing/sitter in room    Deliyah Muckle B. [address], PT, DPT [phone]   10/31/2012, 4:29 PM

## 2012-10-31 NOTE — Progress Notes (Signed)
Patient refusing blood transfiusion, MD aware.

## 2012-10-31 NOTE — Progress Notes (Signed)
Patient for discharge tomorrow. FL2 and discharge summary sent to [facility] today.  [name] [name] MSW, LCSW [phone]

## 2012-10-31 NOTE — Discharge Summary (Signed)
Physician Discharge Summary  [name] GNF:[id number] DOB: [birth year] DOA: 10/29/2012  PCP: Pamelia Hoit, MD  Admit date: 10/29/2012 Discharge date: 11/01/2012  Recommendations for Outpatient Follow-up:  1. Pt will need to follow up with PCP in 2-3 weeks post discharge 2. Please obtain BMP to evaluate electrolytes and kidney function 3. Please also check CBC to evaluate Hg and Hct levels 4. Plan for discharge in AM 11/01/2012 5. Please note that Labetalol was stopped due to bradycardia, consider restarting if HR stable   Discharge Diagnoses: Bradycardia  Principal Problem:   Bradycardia Active Problems:   HYPOTHYROIDISM   ANEMIA, IRON DEFICIENCY   EMPHYSEMA   CONSTIPATION, CHRONIC   RENAL INSUFFICIENCY, CHRONIC   RENAL FAILURE   Hypothermia due to non-environmental cause   CKD (chronic kidney disease)  Discharge Condition: Stable  Diet recommendation: Heart healthy diet discussed in details   Brief narrative:  Pt is 67 y.o. female with hx of constipation, HTN, Bipolar disorder, unsteady gait, fallen recently, presents to the ER as she fell again just prior to coming to ED. She also has been constipated, followed by [facility]. She denied chest pain, shortness of breath. She had a mechanical fall 6/17, and at that time, had a head CT which showed stable ventriculomegaly raising ? NPH versus atrophic dilatation. She also was found to have AKI with Cr of 3.5, and Hb of 7.1g/DL, no recent Hb to compare. In the ER, she was found to have hypothermia with T of 57F. She also has SB with HR of 50, and occasionally in the high 40's asymtomatically. She is alert, awake and conversing. Hospitalist was asked to admit her for bradycardia, anemia, worsening CKD.   Principal Problem:  Bradycardia  - unclear etiology, will continue to monitor on telemetry unit  - TSH is low, lowered synthroid dose to 25 mcg PO QD  - needs repeat TSH in 6 weeks  - HR stable  Active Problems:   HYPOTHYROIDISM  - TSH too low, lower the dose of Synthroid  ANEMIA, IRON DEFICIENCY  - per review of records, baseline is 8-9  - pt agreeable to transfusion  - repeat CBC in AM  EMPHYSEMA  - oxygen saturations stable  CONSTIPATION, CHRONIC  - continue bowel regimen  RENAL FAILURE, acute on chronic, stage III-IV  - review of records indicate that baseline creatinine is ~ 2.7  - continue IVF for now and repeat BMP in AM  Hypothermia due to non-environmental cause  - possible SIRS, UA worrisome for UTI, will treat with empiric Rocephin  - urine cultures pending  - temperature stable   Consultants:  None Procedures/Studies:  None Antibiotics:  Rocephin 06/26 -->  Code Status: Full  Family Communication: Pt at bedside   Discharge Exam: Filed Vitals:   10/31/12 0500  BP: 167/72  Pulse: 64  Temp: 98.3 F (36.8 C)  Resp: 16   Filed Vitals:   10/30/12 0555 10/30/12 1429 10/30/12 2222 10/31/12 0500  BP: 140/44 129/53 156/54 167/72  Pulse: 73 79 74 64  Temp: 98.3 F (36.8 C) 98.1 F (36.7 C) 98.3 F (36.8 C) 98.3 F (36.8 C)  TempSrc: Oral Oral Oral Oral  Resp: 20 18 20 16   Height:      Weight:      SpO2: 97% 97% 96% 97%    General: Pt is alert, follows commands appropriately, not in acute distress Cardiovascular: Regular rate and rhythm, S1/S2 +, no murmurs, no rubs, no gallops Respiratory: Clear to auscultation  bilaterally, no wheezing, no crackles, no rhonchi Abdominal: Soft, non tender, non distended, bowel sounds +, no guarding Extremities: no edema, no cyanosis, pulses palpable bilaterally DP and PT Neuro: Grossly nonfocal  Discharge Instructions     Medication List    STOP taking these medications       labetalol 200 MG tablet  Commonly known as:  NORMODYNE      TAKE these medications       acetaminophen 500 MG tablet  Commonly known as:  TYLENOL  Take 1,000 mg by mouth every 4 (four) hours as needed for pain.     ALPRAZolam 1 MG tablet   Commonly known as:  XANAX  Take 1 mg by mouth 3 (three) times daily.     ALPRAZolam 1 MG tablet  Commonly known as:  XANAX  Take 1 mg by mouth every 6 (six) hours as needed for anxiety.     amLODipine 10 MG tablet  Commonly known as:  NORVASC  Take 10 mg by mouth at bedtime.     cetirizine 10 MG tablet  Commonly known as:  ZYRTEC  Take 10 mg by mouth daily as needed for allergies.     cyclobenzaprine 5 MG tablet  Commonly known as:  FLEXERIL  Take 5 mg by mouth 3 (three) times daily as needed for muscle spasms.     docusate sodium 100 MG capsule  Commonly known as:  COLACE  Take 200-300 mg by mouth 2 (two) times daily. 3 caps in am, 2 caps in pm     famotidine 20 MG tablet  Commonly known as:  PEPCID  Take 20 mg by mouth 2 (two) times daily.     guaifenesin 100 MG/5ML syrup  Commonly known as:  ROBITUSSIN  Take 200 mg by mouth 4 (four) times daily as needed for cough.     lamoTRIgine 200 MG tablet  Commonly known as:  LAMICTAL  Take 200 mg by mouth 2 (two) times daily.     levothyroxine 25 MCG tablet  Commonly known as:  SYNTHROID, LEVOTHROID  Take 3 tablets (75 mcg total) by mouth daily before breakfast.     lubiprostone 24 MCG capsule  Commonly known as:  AMITIZA  Take 24 mcg by mouth 2 (two) times daily with a meal.     magnesium hydroxide 400 MG/5ML suspension  Commonly known as:  MILK OF MAGNESIA  Take 30-60 mLs by mouth daily as needed for constipation.     meclizine 25 MG tablet  Commonly known as:  ANTIVERT  Take 1 tablet (25 mg total) by mouth 3 (three) times daily as needed for dizziness.     omega-3 acid ethyl esters 1 G capsule  Commonly known as:  LOVAZA  Take 4 g by mouth daily.     Oxcarbazepine 300 MG tablet  Commonly known as:  TRILEPTAL  Take 900 mg by mouth 2 (two) times daily.     polyethylene glycol packet  Commonly known as:  MIRALAX / GLYCOLAX  Take 17 g by mouth daily.     risperiDONE 2 MG tablet  Commonly known as:  RISPERDAL   Take 4 mg by mouth 2 (two) times daily.     sodium phosphate enema  Commonly known as:  FLEET  Place 1 enema rectally as needed (for constipation, may self-administer.).     traMADol 50 MG tablet  Commonly known as:  ULTRAM  Take 50 mg by mouth 2 (two) times daily.  Follow-up Information   Follow up with Pamelia Hoit, MD In 2 weeks.   Contact information:   [address] [place] [address] [phone]        The results of significant diagnostics from this hospitalization (including imaging, microbiology, ancillary and laboratory) are listed below for reference.     Microbiology: Recent Results (from the past 240 hour(s))  URINE CULTURE     Status: None   Collection Time    10/30/12 12:53 AM      Result Value Range Status   Specimen Description URINE, CLEAN CATCH   Final   Special Requests NONE   Final   Culture  Setup Time 10/30/2012 05:12   Final   Colony Count PENDING   Incomplete   Culture Culture reincubated for better growth   Final   Report Status PENDING   Incomplete     Labs: Basic Metabolic Panel:  Recent Labs Lab 10/29/12 1850 10/30/12 0436 10/31/12 0425  NA 138 139 138  K 5.1 4.6 4.9  CL 116* 112 112  CO2  --  15* 13*  GLUCOSE 72 132* 91  BUN 75* 60* 54*  CREATININE 3.50* 3.30* 2.80*  CALCIUM  --  8.7 8.5   CBC:  Recent Labs Lab 10/29/12 1850 10/29/12 1900 10/30/12 0436 10/31/12 0425  WBC  --  3.2* 3.1* 3.1*  HGB 7.1* 7.0* 7.4* 6.9*  HCT 21.0* 21.3* 23.0* 21.3*  MCV  --  100.9* 100.9* 101.4*  PLT  --  115* 142* 135*   CBG:  Recent Labs Lab 10/29/12 1650  GLUCAP 82     SIGNED: Time coordinating discharge: Over 30 minutes  Debbora Presto, MD  [facility] 10/31/2012, 12:56 PM Pager [phone]  If 7PM-7AM, please contact night-coverage [email or web address] Password [email or web address]

## 2012-10-31 NOTE — Progress Notes (Signed)
CSW spoke with patient's sister, Britta Mccreedy, she confirms that patient is to return to [facility] discharge.  Neville Pauls C. Helmuth Recupero MSW, LCSW [phone]

## 2012-10-31 NOTE — Progress Notes (Signed)
Patient ID: [name], female   DOB: Apr 26, 1946, 67 y.o.   MRN: [id number] [facility] PROGRESS NOTE  KASSIA DEMARINIS HQI:[id number] DOB: 10/23/1945 DOA: 10/29/2012 PCP: Pamelia Hoit, MD  Brief narrative:  Pt is 67 y.o. female with hx of constipation, HTN, Bipolar disorder, unsteady gait, fallen recently, presents to the ER as she fell again just prior to coming to ED. She also has been constipated, followed by [facility]. She denied chest pain, shortness of breath. She had a mechanical fall 6/17, and at that time, had a head CT which showed stable ventriculomegaly raising ? NPH versus atrophic dilatation. She also was found to have AKI with Cr of 3.5, and Hb of 7.1g/DL, no recent Hb to compare. In the ER, she was found to have hypothermia with T of 77F. She also has SB with HR of 50, and occasionally in the high 40's asymtomatically. She is alert, awake and conversing. Hospitalist was asked to admit her for bradycardia, anemia, worsening CKD.   Principal Problem:  Bradycardia  - unclear etiology, will continue to monitor on telemetry unit  - TSH is low, lowered synthroid dose to 25 mcg PO QD  - needs repeat TSH in 6 weeks  - HR stable  Active Problems:  HYPOTHYROIDISM  - TSH too low, lower the dose of Synthroid  ANEMIA, IRON DEFICIENCY  - per review of records, baseline is 8-9  - pt agreeable to transfusion  - repeat CBC in AM EMPHYSEMA  - oxygen saturations stable  CONSTIPATION, CHRONIC  - continue bowel regimen  RENAL FAILURE, acute on chronic, stage III-IV  - review of records indicate that baseline creatinine is ~ 2.7  - continue IVF for now and repeat BMP in AM  Hypothermia due to non-environmental cause  - possible SIRS, UA worrisome for UTI, will treat with empiric Rocephin  - urine cultures pending  - temperature stable   Consultants:  None Procedures/Studies:  None Antibiotics:  Rocephin 06/26 -->  Code Status: Full  Family Communication: Pt at bedside   Disposition Plan: Home when medically stable    HPI/Subjective: No events overnight.   Objective: Filed Vitals:   10/30/12 0555 10/30/12 1429 10/30/12 2222 10/31/12 0500  BP: 140/44 129/53 156/54 167/72  Pulse: 73 79 74 64  Temp: 98.3 F (36.8 C) 98.1 F (36.7 C) 98.3 F (36.8 C) 98.3 F (36.8 C)  TempSrc: Oral Oral Oral Oral  Resp: 20 18 20 16   Height:      Weight:      SpO2: 97% 97% 96% 97%    Intake/Output Summary (Last 24 hours) at 10/31/12 1252 Last data filed at 10/31/12 0836  Gross per 24 hour  Intake   2450 ml  Output    950 ml  Net   1500 ml    Exam:   General:  Pt is alert, follows commands appropriately, not in acute distress  Cardiovascular: Regular rate and rhythm, S1/S2, no murmurs, no rubs, no gallops  Respiratory: Clear to auscultation bilaterally, no wheezing, no crackles, no rhonchi  Abdomen: Soft, non tender, non distended, bowel sounds present, no guarding  Extremities: No edema, pulses DP and PT palpable bilaterally  Neuro: Grossly nonfocal  Data Reviewed: Basic Metabolic Panel:  Recent Labs Lab 10/29/12 1850 10/30/12 0436 10/31/12 0425  NA 138 139 138  K 5.1 4.6 4.9  CL 116* 112 112  CO2  --  15* 13*  GLUCOSE 72 132* 91  BUN 75* 60* 54*  CREATININE  3.50* 3.30* 2.80*  CALCIUM  --  8.7 8.5   CBC:  Recent Labs Lab 10/29/12 1850 10/29/12 1900 10/30/12 0436 10/31/12 0425  WBC  --  3.2* 3.1* 3.1*  HGB 7.1* 7.0* 7.4* 6.9*  HCT 21.0* 21.3* 23.0* 21.3*  MCV  --  100.9* 100.9* 101.4*  PLT  --  115* 142* 135*   CBG:  Recent Labs Lab 10/29/12 1650  GLUCAP 82    Recent Results (from the past 240 hour(s))  URINE CULTURE     Status: None   Collection Time    10/30/12 12:53 AM      Result Value Range Status   Specimen Description URINE, CLEAN CATCH   Final   Special Requests NONE   Final   Culture  Setup Time 10/30/2012 05:12   Final   Colony Count PENDING   Incomplete   Culture Culture reincubated for better growth    Final   Report Status PENDING   Incomplete     Scheduled Meds: . amLODipine  10 mg Oral QHS  . cefTRIAXone (ROCEPHIN)  IV  1 g Intravenous Q24H  . docusate sodium  300 mg Oral Daily  . famotidine  20 mg Oral BID  . heparin  5,000 Units Subcutaneous Q8H  . lamoTRIgine  200 mg Oral BID  . levothyroxine  25 mcg Oral QAC breakfast  . loratadine  10 mg Oral Daily  . lubiprostone  24 mcg Oral BID WC  . omega-3 acid ethyl esters  4 g Oral Daily  . Oxcarbazepine  900 mg Oral BID  . polyethylene glycol  17 g Oral Daily  . risperiDONE  4 mg Oral BID  . sodium chloride  3 mL Intravenous Q12H   Continuous Infusions: . sodium chloride 75 mL/hr at 10/30/12 2334  . dextrose 5 % and 0.9% NaCl 100 mL/hr at 10/30/12 2056    Anna Presto, MD  [facility] Pager [phone]  If 7PM-7AM, please contact night-coverage [email or web address] Password [facility] 10/31/2012, 12:52 PM   LOS: 2 days

## 2012-11-01 LAB — TYPE AND SCREEN
Antibody Screen: NEGATIVE
Unit division: 0

## 2012-11-01 LAB — CBC
MCH: 32 pg (ref 26.0–34.0)
MCHC: 32.8 g/dL (ref 30.0–36.0)
Platelets: 160 10*3/uL (ref 150–400)
RBC: 3.16 MIL/uL — ABNORMAL LOW (ref 3.87–5.11)

## 2012-11-01 LAB — BASIC METABOLIC PANEL
CO2: 14 mEq/L — ABNORMAL LOW (ref 19–32)
Calcium: 8.9 mg/dL (ref 8.4–10.5)
GFR calc Af Amer: 18 mL/min — ABNORMAL LOW (ref >90)
GFR calc non Af Amer: 16 mL/min — ABNORMAL LOW (ref >90)
Sodium: 142 mEq/L (ref 135–145)

## 2012-11-01 NOTE — Progress Notes (Signed)
Patient ID: [name], female   DOB: 1945-06-18, 66 y.o.   MRN: [id number]  [facility] PROGRESS NOTE  ADLINE KIRSHENBAUM WUJ:[id number] DOB: March 10, 1946 DOA: 10/29/2012 PCP: Pamelia Hoit, MD  Brief narrative:  Pt is 67 y.o. female with hx of constipation, HTN, Bipolar disorder, unsteady gait, fallen recently, presents to the ER as she fell again just prior to coming to ED. She also has been constipated, followed by [facility]. She denied chest pain, shortness of breath. She had a mechanical fall 6/17, and at that time, had a head CT which showed stable ventriculomegaly raising ? NPH versus atrophic dilatation. She also was found to have AKI with Cr of 3.5, and Hb of 7.1g/DL, no recent Hb to compare. In the ER, she was found to have hypothermia with T of 62F. She also has SB with HR of 50, and occasionally in the high 40's asymtomatically. She is alert, awake and conversing. Hospitalist was asked to admit her for bradycardia, anemia, worsening CKD.   Principal Problem:  Bradycardia  - unclear etiology, will continue to monitor on telemetry unit  - TSH is low, lowered synthroid dose to 25 mcg PO QD  - needs repeat TSH in 6 weeks  - HR stable  - please note that Labetalol is on hold for now  Active Problems:  HYPOTHYROIDISM  - TSH too low, lowered the dose of Synthroid  ANEMIA, IRON DEFICIENCY  - per review of records, baseline is 8-9  - status post transfusion 2 units PRBC, increase in Hg - no signs of active bleeding  - repeat CBC in AM  EMPHYSEMA  - oxygen saturations stable  CONSTIPATION, CHRONIC  - continue bowel regimen  RENAL FAILURE, acute on chronic, stage III-IV  - review of records indicate that baseline creatinine is ~ 2.7  - pt tolerating PO intake well, discontinue IVF for now - repeat BMP in AM Hypothermia due to non-environmental cause  - possible SIRS, UA worrisome for UTI, will treat with empiric Rocephin  - urine cultures pending  - temperature stable    Consultants:  None Procedures/Studies:  None Antibiotics:  Rocephin 06/26 -->  Code Status: Full  Family Communication: Pt at bedside   HPI/Subjective: No events overnight.   Objective: Filed Vitals:   11/01/12 0220 11/01/12 0455 11/01/12 0636 11/01/12 1351  BP: 127/51 172/69 158/66 169/74  Pulse: 82 84  86  Temp: 98.6 F (37 C) 98.4 F (36.9 C)  97.2 F (36.2 C)  TempSrc: Oral Oral  Oral  Resp: 16 16  17   Height:      Weight:      SpO2:  98%  99%    Intake/Output Summary (Last 24 hours) at 11/01/12 1614 Last data filed at 11/01/12 1300  Gross per 24 hour  Intake 2687.5 ml  Output   3725 ml  Net -1037.5 ml    Exam:   General:  Pt is alert, follows commands appropriately, not in acute distress  Cardiovascular: Regular rate and rhythm, S1/S2, no murmurs, no rubs, no gallops  Respiratory: Clear to auscultation bilaterally, no wheezing, no crackles, no rhonchi  Abdomen: Soft, non tender, non distended, bowel sounds present, no guarding  Extremities: No edema, pulses DP and PT palpable bilaterally  Neuro: Grossly nonfocal  Data Reviewed: Basic Metabolic Panel:  Recent Labs Lab 10/29/12 1850 10/30/12 0436 10/31/12 0425 11/01/12 0842  NA 138 139 138 142  K 5.1 4.6 4.9 4.3  CL 116* 112 112 113*  CO2  --  15* 13* 14*  GLUCOSE 72 132* 91 93  BUN 75* 60* 54* 48*  CREATININE 3.50* 3.30* 2.80* 2.91*  CALCIUM  --  8.7 8.5 8.9   CBC:  Recent Labs Lab 10/29/12 1850 10/29/12 1900 10/30/12 0436 10/31/12 0425 11/01/12 0842  WBC  --  3.2* 3.1* 3.1* 3.4*  HGB 7.1* 7.0* 7.4* 6.9* 10.1*  HCT 21.0* 21.3* 23.0* 21.3* 30.8*  MCV  --  100.9* 100.9* 101.4* 97.5  PLT  --  115* 142* 135* 160   CBG:  Recent Labs Lab 10/29/12 1650  GLUCAP 82    Recent Results (from the past 240 hour(s))  URINE CULTURE     Status: None   Collection Time    10/30/12 12:53 AM      Result Value Range Status   Specimen Description URINE, CLEAN CATCH   Final   Special  Requests NONE   Final   Culture  Setup Time 10/30/2012 05:12   Final   Colony Count PENDING   Incomplete   Culture Culture reincubated for better growth   Final   Report Status PENDING   Incomplete     Scheduled Meds: . amLODipine  10 mg Oral QHS  . cefTRIAXone  IV  1 g Intravenous Q24H  . docusate sodium  300 mg Oral Daily  . famotidine  20 mg Oral BID  . heparin  5,000 Units Subcutaneous Q8H  . lamoTRIgine  200 mg Oral BID  . levothyroxine  25 mcg Oral QAC breakfast  . loratadine  10 mg Oral Daily  . Oxcarbazepine  900 mg Oral BID  . polyethylene glycol  17 g Oral Daily  . risperiDONE  4 mg Oral BID   Continuous Infusions:   Debbora Presto, MD  [facility] Pager [phone]  If 7PM-7AM, please contact night-coverage [email or web address] Password [email or web address] 11/01/2012, 4:14 PM   LOS: 3 days

## 2012-11-01 NOTE — Progress Notes (Addendum)
Per MD, Pt not ready for d/c today.  CSW to continue to follow.  Amanda Davontay [place], LCSWA Clinical Social Work [phone]  

## 2012-11-02 LAB — CBC
MCH: 31.9 pg (ref 26.0–34.0)
Platelets: 149 10*3/uL — ABNORMAL LOW (ref 150–400)
RBC: 2.95 MIL/uL — ABNORMAL LOW (ref 3.87–5.11)

## 2012-11-02 LAB — URINE CULTURE

## 2012-11-02 LAB — BASIC METABOLIC PANEL
CO2: 14 mEq/L — ABNORMAL LOW (ref 19–32)
Calcium: 9.4 mg/dL (ref 8.4–10.5)
Chloride: 115 mEq/L — ABNORMAL HIGH (ref 96–112)
GFR calc Af Amer: 19 mL/min — ABNORMAL LOW (ref >90)
Sodium: 142 mEq/L (ref 135–145)

## 2012-11-02 MED ORDER — FAMOTIDINE 20 MG PO TABS
20.0000 mg | ORAL_TABLET | Freq: Every day | ORAL | Status: DC
  Administered 2012-11-03: 20 mg via ORAL
  Filled 2012-11-02: qty 1

## 2012-11-02 MED ORDER — HYDRALAZINE HCL 25 MG PO TABS
25.0000 mg | ORAL_TABLET | Freq: Three times a day (TID) | ORAL | Status: DC
  Administered 2012-11-02 – 2012-11-03 (×4): 25 mg via ORAL
  Filled 2012-11-02 (×6): qty 1

## 2012-11-02 MED ORDER — PIPERACILLIN-TAZOBACTAM IN DEX 2-0.25 GM/50ML IV SOLN
2.2500 g | Freq: Three times a day (TID) | INTRAVENOUS | Status: DC
  Administered 2012-11-02 – 2012-11-03 (×2): 2.25 g via INTRAVENOUS
  Filled 2012-11-02 (×4): qty 50

## 2012-11-02 NOTE — Progress Notes (Signed)
Assumed care of patient from previous RN.  Agree with previous RN's assessment of patient.  Will continue to monitor patient through end of shift.

## 2012-11-02 NOTE — Progress Notes (Signed)
Patient ID: [name], female   DOB: 12-05-1945, 66 y.o.   MRN: [id number]  [facility] PROGRESS NOTE  [name] WUJ:[id number] DOB: 04/02/1946 DOA: 10/29/2012 PCP: Pamelia Hoit, MD  Brief narrative: Pt is 67 y.o. female with hx of constipation, HTN, Bipolar disorder, unsteady gait, fallen recently, presents to the ER as she fell again just prior to coming to ED. She also has been constipated, followed by [facility]. She denied chest pain, shortness of breath. She had a mechanical fall 6/17, and at that time, had a head CT which showed stable ventriculomegaly raising ? NPH versus atrophic dilatation. She also was found to have AKI with Cr of 3.5, and Hb of 7.1g/DL, no recent Hb to compare. In the ER, she was found to have hypothermia with T of 82F. She also has SB with HR of 50, and occasionally in the high 40's asymtomatically. She is alert, awake and conversing. Hospitalist was asked to admit her for bradycardia, anemia, worsening CKD.   Principal Problem:  Bradycardia  - unclear etiology, will continue to monitor on telemetry unit  - TSH is low, lowered synthroid dose to 25 mcg PO QD  - needs repeat TSH in 6 weeks  - HR stable  - please note that Labetalol is on hold for now and we may be able to order on discharge if HR remains stable  Active Problems:  HYPOTHYROIDISM  - TSH too low, lowered the dose of Synthroid  ANEMIA, IRON DEFICIENCY  - per review of records, baseline is 8-9  - status post transfusion 2 units PRBC 10/31/2012, increase in Hg appropraite  - no signs of active bleeding  - repeat CBC in AM  EMPHYSEMA  - oxygen saturations stable  CONSTIPATION, CHRONIC  - continue bowel regimen  RENAL FAILURE, acute on chronic, stage III-IV  - review of records indicate that baseline creatinine is ~ 2.7  - pt tolerating PO intake well, creatinine is trending down  - repeat BMP in AM  Hypothermia due to non-environmental cause  - possible SIRS, UA worrisome for  UTI, will treat with empiric Rocephin  - urine cultures pending  - temperature stable  HTN, uncontrolled - will add Hydralazine   Consultants:  None Procedures/Studies:  None Antibiotics:  Rocephin 06/26 -->  Code Status: Full  Family Communication: Pt at bedside   HPI/Subjective: No events overnight.   Objective: Filed Vitals:   11/01/12 0636 11/01/12 1351 11/01/12 2217 11/02/12 0528  BP: 158/66 169/74 187/78 197/86  Pulse:  86 81 80  Temp:  97.2 F (36.2 C) 98.8 F (37.1 C) 98.4 F (36.9 C)  TempSrc:  Oral Oral Oral  Resp:  17 20 20   Height:      Weight:      SpO2:  99% 99% 99%    Intake/Output Summary (Last 24 hours) at 11/02/12 0843 Last data filed at 11/02/12 9562  Gross per 24 hour  Intake   1190 ml  Output   5075 ml  Net  -3885 ml    Exam:   General:  Pt is alert, follows commands appropriately, not in acute distress  Cardiovascular: Regular rate and rhythm, S1/S2, no murmurs, no rubs, no gallops  Respiratory: Clear to auscultation bilaterally, no wheezing, no crackles, no rhonchi  Abdomen: Soft, non tender, non distended, bowel sounds present, no guarding  Extremities: No edema, pulses DP and PT palpable bilaterally  Neuro: Grossly nonfocal  Data Reviewed: Basic Metabolic Panel:  Recent Labs Lab 10/29/12  1850 10/30/12 0436 10/31/12 0425 11/01/12 0842 11/02/12 0448  NA 138 139 138 142 142  K 5.1 4.6 4.9 4.3 4.5  CL 116* 112 112 113* 115*  CO2  --  15* 13* 14* 14*  GLUCOSE 72 132* 91 93 90  BUN 75* 60* 54* 48* 49*  CREATININE 3.50* 3.30* 2.80* 2.91* 2.84*  CALCIUM  --  8.7 8.5 8.9 9.4  CBC:  Recent Labs Lab 10/29/12 1900 10/30/12 0436 10/31/12 0425 11/01/12 0842 11/02/12 0448  WBC 3.2* 3.1* 3.1* 3.4* 3.3*  HGB 7.0* 7.4* 6.9* 10.1* 9.4*  HCT 21.3* 23.0* 21.3* 30.8* 28.6*  MCV 100.9* 100.9* 101.4* 97.5 96.9  PLT 115* 142* 135* 160 149*   CBG:  Recent Labs Lab 10/29/12 1650  GLUCAP 82    Recent Results (from the  past 240 hour(s))  URINE CULTURE     Status: None   Collection Time    10/30/12 12:53 AM      Result Value Range Status   Specimen Description URINE, CLEAN CATCH   Final   Special Requests NONE   Final   Culture  Setup Time 10/30/2012 05:12   Final   Colony Count PENDING   Incomplete   Culture Culture reincubated for better growth   Final   Report Status PENDING   Incomplete     Scheduled Meds: . amLODipine  10 mg Oral QHS  . cefTRIAXone  IV  1 g Intravenous Q24H  . docusate sodium  300 mg Oral Daily  . famotidine  20 mg Oral BID  . heparin  5,000 Units Subcutaneous Q8H  . lamoTRIgine  200 mg Oral BID  . levothyroxine  25 mcg Oral QAC breakfast  . loratadine  10 mg Oral Daily  . lubiprostone  24 mcg Oral BID WC  . Oxcarbazepine  900 mg Oral BID  . polyethylene glycol  17 g Oral Daily  . risperiDONE  4 mg Oral BID   Continuous Infusions:   Debbora Presto, MD  [facility] Pager [phone]  If 7PM-7AM, please contact night-coverage [email or web address] Password [facility] 11/02/2012, 8:43 AM   LOS: 4 days

## 2012-11-03 MED ORDER — FOSFOMYCIN TROMETHAMINE 3 G PO PACK
3.0000 g | PACK | ORAL | Status: AC
Start: 2012-11-03 — End: 2012-11-03
  Administered 2012-11-03: 3 g via ORAL
  Filled 2012-11-03: qty 3

## 2012-11-03 MED ORDER — HYDRALAZINE HCL 25 MG PO TABS: 25.0000 mg | ORAL_TABLET | Freq: Three times a day (TID) | ORAL | Status: DC

## 2012-11-03 MED ORDER — LEVOTHYROXINE SODIUM 25 MCG PO TABS: 25.0000 ug | ORAL_TABLET | Freq: Every day | ORAL | Status: DC

## 2012-11-03 MED ORDER — ALPRAZOLAM 1 MG PO TABS: 1.0000 mg | ORAL_TABLET | Freq: Four times a day (QID) | ORAL | Status: DC | PRN

## 2012-11-03 MED ORDER — FOSFOMYCIN TROMETHAMINE 3 G PO PACK: PACK | ORAL | Status: DC

## 2012-11-03 MED ORDER — ALPRAZOLAM 1 MG PO TABS: 1.0000 mg | ORAL_TABLET | Freq: Three times a day (TID) | ORAL | Status: DC

## 2012-11-03 MED ORDER — SODIUM CHLORIDE 0.9 % IV SOLN
250.0000 mg | Freq: Two times a day (BID) | INTRAVENOUS | Status: DC
  Administered 2012-11-03: 250 mg via INTRAVENOUS
  Filled 2012-11-03 (×2): qty 250

## 2012-11-03 NOTE — Care Management Note (Signed)
    Page 1 of 1   11/03/2012     5:21:49 PM   CARE MANAGEMENT NOTE 11/03/2012  Patient:  [name], [name]   Account Number:  [id number]  Date Initiated:  10/31/2012  Documentation initiated by:  Ezekiel Ina  Subjective/Objective Assessment:   pt admitted bradycardia, anemia, renal failure     Action/Plan:   plan to dc home   Anticipated DC Date:  11/01/2012   Anticipated DC Plan:  HOME/SELF CARE  In-house referral  Clinical Social Worker      DC Planning Services  CM consult      Choice offered to / List presented to:          [facility] arranged  HH-2 PT  HH-1 RN      Status of service:  Completed, signed off Medicare Important Message given?   (If response is "NO", the following Medicare IM given date fields will be blank) Date Medicare IM given:   Date Additional Medicare IM given:    Discharge Disposition:  ASSISTED LIVING  Per UR Regulation:  Reviewed for med. necessity/level of care/duration of stay  If discussed at Long Length of Stay Meetings, dates discussed:    Comments:  11/03/2012 Colleen Can BSN RN CCM [phone] CM notified  by CSW that [facility] WANTED [facility] TO BE NOTIFIED of pt's needs for HHpt and HH rn. [facility] rep notified of need and Genevieve Norlander will be able to provide services.   10/31/12 MMcGibboney, RN, BSN Chart reviewed.

## 2012-11-03 NOTE — Progress Notes (Signed)
ANTIBIOTIC CONSULT NOTE - INITIAL  Pharmacy Consult for imipenem Indication: ESBL E. coli UTI  No Known Allergies  Patient Measurements: Height: 5\' 6"  (167.6 cm) Weight: 151 lb 14.4 oz (68.9 kg) IBW/kg (Calculated) : 59.3 Adjusted Body Weight:   Vital Signs: Temp: 98.1 F (36.7 C) (06/30 0500) Temp src: Oral (06/30 0500) BP: 161/75 mmHg (06/30 0500) Pulse Rate: 87 (06/30 0500) Intake/Output from previous day: 06/29 0701 - 06/30 0700 In: 1420 [P.O.:1320; IV Piggyback:100] Out: 3625 [Urine:3625] Intake/Output from this shift: Total I/O In: -  Out: 450 [Urine:450]  Labs:  Recent Labs  11/01/12 0842 11/02/12 0448  WBC 3.4* 3.3*  HGB 10.1* 9.4*  PLT 160 149*  CREATININE 2.91* 2.84*   Estimated Creatinine Clearance: 18.5 ml/min (by C-G formula based on Cr of 2.84). No results found for this basename: Rolm Gala, VANCORANDOM, GENTTROUGH, GENTPEAK, GENTRANDOM, TOBRATROUGH, TOBRAPEAK, TOBRARND, AMIKACINPEAK, AMIKACINTROU, AMIKACIN,  in the last 72 hours   Microbiology: Recent Results (from the past 720 hour(s))  URINE CULTURE     Status: None   Collection Time    10/30/12 12:53 AM      Result Value Range Status   Specimen Description URINE, CLEAN CATCH   Final   Special Requests NONE   Final   Culture  Setup Time 10/30/2012 05:12   Final   Colony Count >=100,000 COLONIES/ML   Final   Culture     Final   Value: ESCHERICHIA COLI     Note: Confirmed Extended Spectrum Beta-Lactamase Producer (ESBL)   Report Status 11/02/2012 FINAL   Final   Organism ID, Bacteria ESCHERICHIA COLI   Final    Medical History: Past Medical History  Diagnosis Date  . Constipation   . Bipolar 1 disorder   . Hypertension   . Esophageal reflux   . Anxiety   . Back pain   . Osteoarthritis   . Gout   . Adenomatous polyps 03-27-07  . Diverticulosis   . Internal hemorrhoids   . Hypothyroidism   . Renal insufficiency     Assessment: 39 YOF with ESBL E. Coli in urine  culture, adjust antibiotics to imipenem  6/30 >>imipenem  >> 6/29 >> zosyn  >> 6/30 6/26 >> ceftriaxone 6/28  Tmax: afebrile WBCs: 3.3 Renal: SCr = 2.84 for CrCl = 18.25ml/min  6/26 urine: >100K ESBL E. coli   Dose changes/drug level info:   Goal of Therapy:  Appropriate abx dosing and treatment of infection  Plan:   Based on current renal fx, imipenem 250mg  IV q12h  Dannielle Huh 11/03/2012,10:36 AM

## 2012-11-03 NOTE — Discharge Summary (Addendum)
Physician Discharge Summary  [name] NWG:[id number] DOB: 1945-07-27 DOA: 10/29/2012  PCP: Anna Hoit, MD  Admit date: 10/29/2012 Discharge date: 11/03/2012  Recommendations for Outpatient Follow-up:  1. Pt will need to follow up with PCP in 2-3 weeks post discharge 2. Please obtain BMP to evaluate electrolytes and kidney function 3. Please also check CBC to evaluate Hg and Hct levels 4. Please note that pt grew ESBL in urine and recommendation bu ID specialist was to give one dose of fosfomycin 3 gm pack prior to discharge and to give another dose July 2nd 5. We have given pt one dose of fosfomycin while inpatient  6. Please note that pt's dose of synthroid was lowered to 25 mcg as her TSH was low, so please check TSH in 6-8 weeks to determine the dose of synthroid 7. Please note that due to bradycardia, labetalol was held during the hospital stay and can be resumed in an outpatient setting if HR and BP stable   Discharge Diagnoses:  Principal Problem:   Bradycardia Active Problems:   HYPOTHYROIDISM   ANEMIA, IRON DEFICIENCY   EMPHYSEMA   CONSTIPATION, CHRONIC   RENAL INSUFFICIENCY, CHRONIC   RENAL FAILURE   Hypothermia due to non-environmental cause   CKD (chronic kidney disease)  Discharge Condition: Stable  Diet recommendation: Heart healthy diet discussed in details   Brief narrative:  Pt is 67 y.o. female with hx of constipation, HTN, Bipolar disorder, unsteady gait, fallen recently, presents to the ER as she fell again just prior to coming to ED. She also has been constipated, followed by [facility]. She denied chest pain, shortness of breath. She had a mechanical fall 6/17, and at that time, had a head CT which showed stable ventriculomegaly raising ? NPH versus atrophic dilatation. She also was found to have AKI with Cr of 3.5, and Hb of 7.1g/DL, no recent Hb to compare. In the ER, she was found to have hypothermia with T of 11F. She also has SB with HR of 50,  and occasionally in the high 40's asymtomatically. She is alert, awake and conversing. Hospitalist was asked to admit her for bradycardia, anemia, worsening CKD.   Principal Problem:  Bradycardia  - unclear etiology, will continue to monitor on telemetry unit  - TSH is low, lowered synthroid dose to 25 mcg PO QD  - needs repeat TSH in 6 - 8 weeks  - HR stable  - please note that Labetalol is on hold for now  Active Problems:  UTI, ESBL - started on Rocephin but was transitioned to Zosyn once urine cultures final - ESBL positive, discussed with ID, recommended fosfomycin one dose prior to discharge and another dose in 2 days, on July 2nd, 2014  HYPOTHYROIDISM  - TSH too low, lowered the dose of Synthroid as noted above  ANEMIA, IRON DEFICIENCY  - per review of records, baseline is 8-9  - status post transfusion 2 units PRBC 10/31/2012, increase in Hg appropraite  - no signs of active bleeding  EMPHYSEMA  - oxygen saturations stable  CONSTIPATION, CHRONIC  - continue bowel regimen  RENAL FAILURE, acute on chronic, stage III-IV  - review of records indicate that baseline creatinine is ~ 2.7  - pt tolerating PO intake well, creatinine is trending down  Hypothermia due to non-environmental cause  - possible SIRS, UA worrisome for UTI, will treat with empiric Rocephin  - urine cultures pending  - temperature stable  HTN, uncontrolled  - added Hydralazine  Consultants:  None Procedures/Studies:  None Antibiotics:  Rocephin 06/26 --> 6/28 Zosyn 6/28 --> 6/30 Fosfomycin 6/30 one dose and second dose due July 1st, 2014   Code Status: Full  Family Communication: Pt at bedside    Discharge Exam: Filed Vitals:   11/03/12 0500  BP: 161/75  Pulse: 87  Temp: 98.1 F (36.7 C)  Resp: 20   Filed Vitals:   11/01/12 2217 11/02/12 0528 11/02/12 1527 11/03/12 0500  BP: 187/78 197/86 168/67 161/75  Pulse: 81 80 83 87  Temp: 98.8 F (37.1 C) 98.4 F (36.9 C) 98.3 F (36.8 C) 98.1  F (36.7 C)  TempSrc: Oral Oral Oral Oral  Resp: 20 20 20 20   Height:      Weight:      SpO2: 99% 99% 98% 98%    General: Pt is alert, follows commands appropriately, not in acute distress Cardiovascular: Regular rate and rhythm, S1/S2 +, no murmurs, no rubs, no gallops Respiratory: Clear to auscultation bilaterally, no wheezing, no crackles, no rhonchi Abdominal: Soft, non tender, non distended, bowel sounds +, no guarding Extremities: no edema, no cyanosis, pulses palpable bilaterally DP and PT Neuro: Grossly nonfocal  Discharge Instructions  Discharge Orders   Future Orders Complete By Expires     Diet - low sodium heart healthy  As directed     Increase activity slowly  As directed         Medication List    STOP taking these medications       labetalol 200 MG tablet  Commonly known as:  NORMODYNE     polyethylene glycol packet  Commonly known as:  MIRALAX / GLYCOLAX      TAKE these medications       acetaminophen 500 MG tablet  Commonly known as:  TYLENOL  Take 1,000 mg by mouth every 4 (four) hours as needed for pain.     ALPRAZolam 1 MG tablet  Commonly known as:  XANAX  Take 1 tablet (1 mg total) by mouth 3 (three) times daily.     ALPRAZolam 1 MG tablet  Commonly known as:  XANAX  Take 1 tablet (1 mg total) by mouth every 6 (six) hours as needed for anxiety.     amLODipine 10 MG tablet  Commonly known as:  NORVASC  Take 10 mg by mouth at bedtime.     cetirizine 10 MG tablet  Commonly known as:  ZYRTEC  Take 10 mg by mouth daily as needed for allergies.     cyclobenzaprine 5 MG tablet  Commonly known as:  FLEXERIL  Take 5 mg by mouth 3 (three) times daily as needed for muscle spasms.     docusate sodium 100 MG capsule  Commonly known as:  COLACE  Take 200-300 mg by mouth 2 (two) times daily. 3 caps in am, 2 caps in pm     famotidine 20 MG tablet  Commonly known as:  PEPCID  Take 20 mg by mouth 2 (two) times daily.     fosfomycin 3 G Pack   Commonly known as:  MONUROL  Please take 3 g once, continue     guaifenesin 100 MG/5ML syrup  Commonly known as:  ROBITUSSIN  Take 200 mg by mouth 4 (four) times daily as needed for cough.     hydrALAZINE 25 MG tablet  Commonly known as:  APRESOLINE  Take 1 tablet (25 mg total) by mouth every 8 (eight) hours.     lamoTRIgine 200 MG tablet  Commonly known as:  LAMICTAL  Take 200 mg by mouth 2 (two) times daily.     levothyroxine 25 MCG tablet  Commonly known as:  SYNTHROID, LEVOTHROID  Take 1 tablet (25 mcg total) by mouth daily before breakfast.     lubiprostone 24 MCG capsule  Commonly known as:  AMITIZA  Take 24 mcg by mouth 2 (two) times daily with a meal.     magnesium hydroxide 400 MG/5ML suspension  Commonly known as:  MILK OF MAGNESIA  Take 30-60 mLs by mouth daily as needed for constipation.     meclizine 25 MG tablet  Commonly known as:  ANTIVERT  Take 1 tablet (25 mg total) by mouth 3 (three) times daily as needed for dizziness.     omega-3 acid ethyl esters 1 G capsule  Commonly known as:  LOVAZA  Take 4 g by mouth daily.     Oxcarbazepine 300 MG tablet  Commonly known as:  TRILEPTAL  Take 900 mg by mouth 2 (two) times daily.     risperiDONE 2 MG tablet  Commonly known as:  RISPERDAL  Take 4 mg by mouth 2 (two) times daily.     sodium phosphate enema  Commonly known as:  FLEET  Place 1 enema rectally as needed (for constipation, may self-administer.).     traMADol 50 MG tablet  Commonly known as:  ULTRAM  Take 50 mg by mouth 2 (two) times daily.           Follow-up Information   Follow up with Anna Hoit, MD In 2 weeks.   Contact information:   [address] [place] [address] [phone]        The results of significant diagnostics from this hospitalization (including imaging, microbiology, ancillary and laboratory) are listed below for reference.     Microbiology: Recent Results (from the past 240 hour(s))  URINE  CULTURE     Status: None   Collection Time    10/30/12 12:53 AM      Result Value Range Status   Specimen Description URINE, CLEAN CATCH   Final   Special Requests NONE   Final   Culture  Setup Time 10/30/2012 05:12   Final   Colony Count >=100,000 COLONIES/ML   Final   Culture     Final   Value: ESCHERICHIA COLI     Note: Confirmed Extended Spectrum Beta-Lactamase Producer (ESBL)   Report Status 11/02/2012 FINAL   Final   Organism ID, Bacteria ESCHERICHIA COLI   Final     Labs: Basic Metabolic Panel:  Recent Labs Lab 10/29/12 1850 10/30/12 0436 10/31/12 0425 11/01/12 0842 11/02/12 0448  NA 138 139 138 142 142  K 5.1 4.6 4.9 4.3 4.5  CL 116* 112 112 113* 115*  CO2  --  15* 13* 14* 14*  GLUCOSE 72 132* 91 93 90  BUN 75* 60* 54* 48* 49*  CREATININE 3.50* 3.30* 2.80* 2.91* 2.84*  CALCIUM  --  8.7 8.5 8.9 9.4   CBC:  Recent Labs Lab 10/29/12 1900 10/30/12 0436 10/31/12 0425 11/01/12 0842 11/02/12 0448  WBC 3.2* 3.1* 3.1* 3.4* 3.3*  HGB 7.0* 7.4* 6.9* 10.1* 9.4*  HCT 21.3* 23.0* 21.3* 30.8* 28.6*  MCV 100.9* 100.9* 101.4* 97.5 96.9  PLT 115* 142* 135* 160 149*   CBG:  Recent Labs Lab 10/29/12 1650  GLUCAP 82     SIGNED: Time coordinating discharge: Over 30 minutes  Debbora Presto, MD  [facility] 11/03/2012, 11:30 AM  Pager [phone]  If 7PM-7AM, please contact night-coverage [email or web address] Password [email or web address]

## 2012-11-03 NOTE — Progress Notes (Signed)
Physical Therapy Treatment Patient Details Name: [name] MRN: [id number] DOB: 1945-10-17 Today's Date: 11/03/2012 Time: 1308-6578 PT Time Calculation (min): 21 min  PT Assessment / Plan / Recommendation  PT Comments   Pt increased distance for ambulation. Pt will benefit from SNF if pt goes for antibx.  Follow Up Recommendations  SNF (if pt will require antibx, SNF will be beneficial for pt.)     Does the patient have the potential to tolerate intense rehabilitation     Barriers to Discharge        Equipment Recommendations  None recommended by PT    Recommendations for Other Services    Frequency Min 3X/week   Progress towards PT Goals Progress towards PT goals: Progressing toward goals  Plan Discharge plan needs to be updated    Precautions / Restrictions Precautions Precautions: Fall Restrictions Weight Bearing Restrictions: No   Pertinent Vitals/Pain C/o HA    Mobility  Bed Mobility Supine to Sit: 5: Supervision;HOB flat Sitting - Scoot to Edge of Bed: 5: Supervision Details for Bed Mobility Assistance: no assistance required. Transfers Sit to Stand: With upper extremity assist;From bed;4: Min guard Stand to Sit: To chair/3-in-1;With armrests;5: Supervision Ambulation/Gait Ambulation/Gait Assistance: 4: Min assist Ambulation Distance (Feet): 80 Feet Assistive device: Rolling walker Ambulation/Gait Assistance Details: cues for  head posture. Pt does remind herself to stand as straight as she can. Gait Pattern: Step-through pattern;Shuffle;Trunk flexed Gait velocity: less than 1.8 ft/sec indicating high risk for falls    Exercises     PT Diagnosis:    PT Problem List:   PT Treatment Interventions:     PT Goals (current goals can now be found in the care plan section)    Visit Information  Last PT Received On: 11/03/12 Assistance Needed: +1 History of Present Illness: 67 y.o. female admitted to [facility] for fall, constipation, bradycardia, anemia,  hypothermia, and CKD.      Subjective Data   I  Have a HA   Cognition  Cognition Arousal/Alertness: Awake/alert    Balance  Static Standing Balance Static Standing - Balance Support: Bilateral upper extremity supported Static Standing - Level of Assistance: 5: Stand by assistance  End of Session PT - End of Session Equipment Utilized During Treatment: Gait belt Activity Tolerance: Patient tolerated treatment well Patient left: in chair;with call bell/phone within reach Nurse Communication: Mobility status   GP     Rada Hay 11/03/2012, 11:43 AM Blanchard Kelch PT [phone]

## 2012-11-03 NOTE — Progress Notes (Signed)
Discharge to [facility], alert and oriented, no complaints of any pain or discomfort upon discharged. PIV removed no s/s of infiltration or swelling. Picked up by sister. D/c packet given to patient. Report given to [facility].

## 2012-11-03 NOTE — Progress Notes (Signed)
Patient is set to discharge back to [facility] ALF today. Patient & facility aware, CSW cleared patient's return with [facility] @ ALF. Discharge packet given to patient. Patient's sister to transport back to facility.   Unice Bailey, LCSW [facility] Clinical Social Worker cell #: [phone]

## 2012-11-06 NOTE — ED Provider Notes (Signed)
I saw and evaluated the patient, reviewed the resident's note and I agree with the findings and plan.   .Face to face Exam:  General:  Awake HEENT:  Atraumatic Resp:  Normal effort Abd:  Nondistended Neuro:No focal weakness   Nelia Shi, MD 11/06/12 2014

## 2012-11-09 ENCOUNTER — Emergency Department (HOSPITAL_COMMUNITY): Payer: Medicare Other

## 2012-11-09 ENCOUNTER — Inpatient Hospital Stay (HOSPITAL_COMMUNITY): Payer: Medicare Other

## 2012-11-09 ENCOUNTER — Inpatient Hospital Stay (HOSPITAL_COMMUNITY)
Admission: EM | Admit: 2012-11-09 | Discharge: 2012-11-17 | DRG: 871 | Disposition: A | Discharge status: Skilled Nursing Facility | Payer: Medicare Other | Attending: Internal Medicine | Admitting: Internal Medicine

## 2012-11-09 ENCOUNTER — Encounter (HOSPITAL_COMMUNITY): Payer: Self-pay

## 2012-11-09 DIAGNOSIS — D126 Benign neoplasm of colon, unspecified: Secondary | ICD-10-CM

## 2012-11-09 DIAGNOSIS — N2581 Secondary hyperparathyroidism of renal origin: Secondary | ICD-10-CM | POA: Diagnosis present

## 2012-11-09 DIAGNOSIS — H0520 Unspecified exophthalmos: Secondary | ICD-10-CM

## 2012-11-09 DIAGNOSIS — R7309 Other abnormal glucose: Secondary | ICD-10-CM | POA: Diagnosis not present

## 2012-11-09 DIAGNOSIS — R140 Abdominal distension (gaseous): Secondary | ICD-10-CM

## 2012-11-09 DIAGNOSIS — D631 Anemia in chronic kidney disease: Secondary | ICD-10-CM | POA: Diagnosis present

## 2012-11-09 DIAGNOSIS — B373 Candidiasis of vulva and vagina: Secondary | ICD-10-CM | POA: Diagnosis present

## 2012-11-09 DIAGNOSIS — I129 Hypertensive chronic kidney disease with stage 1 through stage 4 chronic kidney disease, or unspecified chronic kidney disease: Secondary | ICD-10-CM | POA: Diagnosis present

## 2012-11-09 DIAGNOSIS — E039 Hypothyroidism, unspecified: Secondary | ICD-10-CM | POA: Diagnosis present

## 2012-11-09 DIAGNOSIS — F3289 Other specified depressive episodes: Secondary | ICD-10-CM

## 2012-11-09 DIAGNOSIS — F329 Major depressive disorder, single episode, unspecified: Secondary | ICD-10-CM

## 2012-11-09 DIAGNOSIS — R4182 Altered mental status, unspecified: Secondary | ICD-10-CM

## 2012-11-09 DIAGNOSIS — Z1612 Extended spectrum beta lactamase (ESBL) resistance: Secondary | ICD-10-CM

## 2012-11-09 DIAGNOSIS — K5730 Diverticulosis of large intestine without perforation or abscess without bleeding: Secondary | ICD-10-CM

## 2012-11-09 DIAGNOSIS — K219 Gastro-esophageal reflux disease without esophagitis: Secondary | ICD-10-CM

## 2012-11-09 DIAGNOSIS — N189 Chronic kidney disease, unspecified: Secondary | ICD-10-CM | POA: Diagnosis present

## 2012-11-09 DIAGNOSIS — I959 Hypotension, unspecified: Secondary | ICD-10-CM

## 2012-11-09 DIAGNOSIS — K5900 Constipation, unspecified: Secondary | ICD-10-CM | POA: Diagnosis present

## 2012-11-09 DIAGNOSIS — D696 Thrombocytopenia, unspecified: Secondary | ICD-10-CM | POA: Diagnosis present

## 2012-11-09 DIAGNOSIS — M479 Spondylosis, unspecified: Secondary | ICD-10-CM

## 2012-11-09 DIAGNOSIS — N390 Urinary tract infection, site not specified: Secondary | ICD-10-CM | POA: Diagnosis present

## 2012-11-09 DIAGNOSIS — M169 Osteoarthritis of hip, unspecified: Secondary | ICD-10-CM

## 2012-11-09 DIAGNOSIS — I5031 Acute diastolic (congestive) heart failure: Secondary | ICD-10-CM | POA: Diagnosis not present

## 2012-11-09 DIAGNOSIS — R42 Dizziness and giddiness: Secondary | ICD-10-CM

## 2012-11-09 DIAGNOSIS — N179 Acute kidney failure, unspecified: Secondary | ICD-10-CM | POA: Diagnosis present

## 2012-11-09 DIAGNOSIS — E872 Acidosis, unspecified: Secondary | ICD-10-CM | POA: Diagnosis present

## 2012-11-09 DIAGNOSIS — N184 Chronic kidney disease, stage 4 (severe): Secondary | ICD-10-CM | POA: Diagnosis present

## 2012-11-09 DIAGNOSIS — J984 Other disorders of lung: Secondary | ICD-10-CM | POA: Diagnosis present

## 2012-11-09 DIAGNOSIS — B3731 Acute candidiasis of vulva and vagina: Secondary | ICD-10-CM | POA: Diagnosis present

## 2012-11-09 DIAGNOSIS — Z66 Do not resuscitate: Secondary | ICD-10-CM | POA: Diagnosis not present

## 2012-11-09 DIAGNOSIS — T68XXXA Hypothermia, initial encounter: Secondary | ICD-10-CM

## 2012-11-09 DIAGNOSIS — R001 Bradycardia, unspecified: Secondary | ICD-10-CM

## 2012-11-09 DIAGNOSIS — M4000 Postural kyphosis, site unspecified: Secondary | ICD-10-CM | POA: Diagnosis present

## 2012-11-09 DIAGNOSIS — R197 Diarrhea, unspecified: Secondary | ICD-10-CM | POA: Diagnosis not present

## 2012-11-09 DIAGNOSIS — R079 Chest pain, unspecified: Secondary | ICD-10-CM

## 2012-11-09 DIAGNOSIS — M109 Gout, unspecified: Secondary | ICD-10-CM | POA: Diagnosis present

## 2012-11-09 DIAGNOSIS — A419 Sepsis, unspecified organism: Principal | ICD-10-CM | POA: Diagnosis present

## 2012-11-09 DIAGNOSIS — R680 Hypothermia, not associated with low environmental temperature: Secondary | ICD-10-CM

## 2012-11-09 DIAGNOSIS — I509 Heart failure, unspecified: Secondary | ICD-10-CM | POA: Diagnosis not present

## 2012-11-09 DIAGNOSIS — I10 Essential (primary) hypertension: Secondary | ICD-10-CM

## 2012-11-09 DIAGNOSIS — Z87891 Personal history of nicotine dependence: Secondary | ICD-10-CM

## 2012-11-09 DIAGNOSIS — F314 Bipolar disorder, current episode depressed, severe, without psychotic features: Secondary | ICD-10-CM | POA: Diagnosis present

## 2012-11-09 DIAGNOSIS — A499 Bacterial infection, unspecified: Secondary | ICD-10-CM

## 2012-11-09 DIAGNOSIS — N058 Unspecified nephritic syndrome with other morphologic changes: Secondary | ICD-10-CM | POA: Diagnosis present

## 2012-11-09 DIAGNOSIS — I359 Nonrheumatic aortic valve disorder, unspecified: Secondary | ICD-10-CM | POA: Diagnosis present

## 2012-11-09 DIAGNOSIS — Z79899 Other long term (current) drug therapy: Secondary | ICD-10-CM

## 2012-11-09 DIAGNOSIS — F411 Generalized anxiety disorder: Secondary | ICD-10-CM | POA: Diagnosis present

## 2012-11-09 DIAGNOSIS — IMO0001 Reserved for inherently not codable concepts without codable children: Secondary | ICD-10-CM

## 2012-11-09 DIAGNOSIS — M1610 Unilateral primary osteoarthritis, unspecified hip: Secondary | ICD-10-CM

## 2012-11-09 DIAGNOSIS — K5909 Other constipation: Secondary | ICD-10-CM | POA: Diagnosis present

## 2012-11-09 DIAGNOSIS — F309 Manic episode, unspecified: Secondary | ICD-10-CM | POA: Diagnosis present

## 2012-11-09 DIAGNOSIS — M1990 Unspecified osteoarthritis, unspecified site: Secondary | ICD-10-CM

## 2012-11-09 DIAGNOSIS — K859 Acute pancreatitis without necrosis or infection, unspecified: Secondary | ICD-10-CM | POA: Diagnosis present

## 2012-11-09 DIAGNOSIS — E871 Hypo-osmolality and hyponatremia: Secondary | ICD-10-CM | POA: Diagnosis present

## 2012-11-09 DIAGNOSIS — N19 Unspecified kidney failure: Secondary | ICD-10-CM

## 2012-11-09 DIAGNOSIS — J438 Other emphysema: Secondary | ICD-10-CM

## 2012-11-09 DIAGNOSIS — J90 Pleural effusion, not elsewhere classified: Secondary | ICD-10-CM | POA: Diagnosis present

## 2012-11-09 DIAGNOSIS — D509 Iron deficiency anemia, unspecified: Secondary | ICD-10-CM

## 2012-11-09 DIAGNOSIS — E782 Mixed hyperlipidemia: Secondary | ICD-10-CM

## 2012-11-09 LAB — COMPREHENSIVE METABOLIC PANEL
ALT: 30 U/L (ref 0–35)
AST: 19 U/L (ref 0–37)
Alkaline Phosphatase: 232 U/L — ABNORMAL HIGH (ref 39–117)
CO2: 11 mEq/L — ABNORMAL LOW (ref 19–32)
Chloride: 101 mEq/L (ref 96–112)
Creatinine, Ser: 4.42 mg/dL — ABNORMAL HIGH (ref 0.50–1.10)
GFR calc non Af Amer: 10 mL/min — ABNORMAL LOW (ref >90)
Potassium: 4.5 mEq/L (ref 3.5–5.1)
Total Bilirubin: 0.1 mg/dL — ABNORMAL LOW (ref 0.3–1.2)

## 2012-11-09 LAB — CBC
MCV: 99.2 fL (ref 78.0–100.0)
Platelets: 150 10*3/uL (ref 150–400)
RBC: 2.64 MIL/uL — ABNORMAL LOW (ref 3.87–5.11)
WBC: 3.4 10*3/uL — ABNORMAL LOW (ref 4.0–10.5)

## 2012-11-09 LAB — URINALYSIS, ROUTINE W REFLEX MICROSCOPIC
Bilirubin Urine: NEGATIVE
Glucose, UA: NEGATIVE mg/dL
Ketones, ur: NEGATIVE mg/dL
Protein, ur: 100 mg/dL — AB
Urobilinogen, UA: 0.2 mg/dL (ref 0.0–1.0)

## 2012-11-09 LAB — URINE MICROSCOPIC-ADD ON

## 2012-11-09 LAB — TSH: TSH: 0.618 u[IU]/mL (ref 0.350–4.500)

## 2012-11-09 MED ORDER — OXCARBAZEPINE 300 MG PO TABS
900.0000 mg | ORAL_TABLET | Freq: Two times a day (BID) | ORAL | Status: DC
  Administered 2012-11-09 – 2012-11-17 (×16): 900 mg via ORAL
  Filled 2012-11-09 (×17): qty 3

## 2012-11-09 MED ORDER — LAMOTRIGINE 200 MG PO TABS
200.0000 mg | ORAL_TABLET | Freq: Two times a day (BID) | ORAL | Status: DC
  Administered 2012-11-09 – 2012-11-17 (×16): 200 mg via ORAL
  Filled 2012-11-09 (×21): qty 1

## 2012-11-09 MED ORDER — SODIUM CHLORIDE 0.9 % IV BOLUS (SEPSIS)
1000.0000 mL | Freq: Once | INTRAVENOUS | Status: AC
  Administered 2012-11-09: 1000 mL via INTRAVENOUS

## 2012-11-09 MED ORDER — SODIUM CHLORIDE 0.9 % IV SOLN
250.0000 mg | Freq: Two times a day (BID) | INTRAVENOUS | Status: AC
  Administered 2012-11-10 – 2012-11-15 (×12): 250 mg via INTRAVENOUS
  Filled 2012-11-09 (×13): qty 250

## 2012-11-09 MED ORDER — ACETAMINOPHEN 500 MG PO TABS
1000.0000 mg | ORAL_TABLET | ORAL | Status: DC | PRN
  Administered 2012-11-09 – 2012-11-12 (×5): 1000 mg via ORAL
  Filled 2012-11-09 (×4): qty 2

## 2012-11-09 MED ORDER — SODIUM CHLORIDE 0.9 % IV BOLUS (SEPSIS)
1000.0000 mL | Freq: Once | INTRAVENOUS | Status: AC
  Administered 2012-11-09: 500 mL via INTRAVENOUS

## 2012-11-09 MED ORDER — SODIUM CHLORIDE 0.9 % IV SOLN: 500.0000 mg | Freq: Once | INTRAVENOUS | Status: DC

## 2012-11-09 MED ORDER — RISPERIDONE 2 MG PO TABS
4.0000 mg | ORAL_TABLET | Freq: Two times a day (BID) | ORAL | Status: DC
  Administered 2012-11-09 – 2012-11-10 (×3): 4 mg via ORAL
  Filled 2012-11-09 (×5): qty 2

## 2012-11-09 MED ORDER — DISPOSABLE ENEMA 19-7 GM/118ML RE ENEM
1.0000 | ENEMA | RECTAL | Status: DC | PRN
  Filled 2012-11-09: qty 1

## 2012-11-09 MED ORDER — OMEGA-3-ACID ETHYL ESTERS 1 G PO CAPS
4.0000 g | ORAL_CAPSULE | Freq: Every day | ORAL | Status: DC
  Filled 2012-11-09 (×8): qty 4

## 2012-11-09 MED ORDER — DEXTROSE-NACL 5-0.9 % IV SOLN
INTRAVENOUS | Status: DC
  Administered 2012-11-09: 21:00:00 via INTRAVENOUS
  Administered 2012-11-10: 125 mL/h via INTRAVENOUS

## 2012-11-09 MED ORDER — NOREPINEPHRINE BITARTRATE 1 MG/ML IJ SOLN
2.0000 ug/min | INTRAVENOUS | Status: DC
  Administered 2012-11-10: 15 ug/min via INTRAVENOUS
  Administered 2012-11-10: 4 ug/min via INTRAVENOUS
  Administered 2012-11-10: 15 ug/min via INTRAVENOUS
  Filled 2012-11-09 (×3): qty 4

## 2012-11-09 MED ORDER — LEVOTHYROXINE SODIUM 25 MCG PO TABS
25.0000 ug | ORAL_TABLET | Freq: Every day | ORAL | Status: DC
  Administered 2012-11-10: 25 ug via ORAL
  Filled 2012-11-09 (×3): qty 1

## 2012-11-09 MED ORDER — SODIUM CHLORIDE 0.9 % IJ SOLN
3.0000 mL | Freq: Two times a day (BID) | INTRAMUSCULAR | Status: DC
  Administered 2012-11-10: 3 mL via INTRAVENOUS
  Administered 2012-11-10: 10:00:00 via INTRAVENOUS
  Administered 2012-11-11: 3 mL via INTRAVENOUS
  Administered 2012-11-11: 22:00:00 via INTRAVENOUS
  Administered 2012-11-12 – 2012-11-16 (×6): 3 mL via INTRAVENOUS

## 2012-11-09 MED ORDER — PIPERACILLIN-TAZOBACTAM 3.375 G IVPB
3.3750 g | Freq: Once | INTRAVENOUS | Status: AC
Start: 2012-11-09 — End: 2012-11-09
  Administered 2012-11-09: 3.375 g via INTRAVENOUS
  Filled 2012-11-09: qty 50

## 2012-11-09 MED ORDER — SODIUM CHLORIDE 0.9 % IV SOLN
250.0000 mg | Freq: Once | INTRAVENOUS | Status: AC
  Administered 2012-11-09: 250 mg via INTRAVENOUS
  Filled 2012-11-09: qty 250

## 2012-11-09 MED ORDER — SODIUM CHLORIDE 0.9 % IV BOLUS (SEPSIS)
500.0000 mL | Freq: Once | INTRAVENOUS | Status: AC
  Administered 2012-11-09: 500 mL via INTRAVENOUS

## 2012-11-09 MED ORDER — FAMOTIDINE 20 MG PO TABS
20.0000 mg | ORAL_TABLET | Freq: Two times a day (BID) | ORAL | Status: DC
  Administered 2012-11-09 – 2012-11-12 (×6): 20 mg via ORAL
  Filled 2012-11-09 (×7): qty 1

## 2012-11-09 MED ORDER — DISPOSABLE ENEMA 19-7 GM/118ML RE ENEM: 1.0000 | ENEMA | RECTAL | Status: DC | PRN

## 2012-11-09 MED ORDER — FLEET ENEMA 7-19 GM/118ML RE ENEM: 1.0000 | ENEMA | RECTAL | Status: DC | PRN

## 2012-11-09 MED ORDER — HEPARIN SODIUM (PORCINE) 5000 UNIT/ML IJ SOLN
5000.0000 [IU] | Freq: Three times a day (TID) | INTRAMUSCULAR | Status: DC
  Administered 2012-11-09 – 2012-11-17 (×24): 5000 [IU] via SUBCUTANEOUS
  Filled 2012-11-09 (×26): qty 1

## 2012-11-09 NOTE — ED Provider Notes (Signed)
History    CSN: [id number] Arrival date & time 11/09/12  1536  First MD Initiated Contact with Patient 11/09/12 1552     Chief Complaint  Patient presents with  . Altered Mental Status  . Hypotension   (Consider location/radiation/quality/duration/timing/severity/associated sxs/prior Treatment) HPI A LEVEL 5 CAVEAT PERTAINS DUE TO ALTERED MENTAL STATUS Presenting from nursing home with complaint of altered mental status and she was also noted to have low blood pressure. Approximately 5 days ago she was started on a new blood pressure medication. It is unclear when she started to have decreased mental status. Her blood pressure per EMS was 85/31. Patient complains of feeling fatigued and sleepy. She is not able to provide any further history although she does mention she is having no pain or difficulty breathing. Past Medical History  Diagnosis Date  . Constipation   . Bipolar 1 disorder   . Hypertension   . Esophageal reflux   . Anxiety   . Back pain   . Osteoarthritis   . Gout   . Adenomatous polyps 03-27-07  . Diverticulosis   . Internal hemorrhoids   . Hypothyroidism   . Renal insufficiency    Past Surgical History  Procedure Laterality Date  . Cholecystectomy    . Appendectomy    . Tonsillectomy    . Tubal ligation     Family History  Problem Relation Age of Onset  . Hypertension Mother   . Atrial fibrillation Father   . Colon cancer Neg Hx   . Heart disease Mother    History  Substance Use Topics  . Smoking status: Former Smoker    Quit date: 05/08/2003  . Smokeless tobacco: Never Used  . Alcohol Use: No   OB History   Grav Para Term Preterm Abortions TAB SAB Ect Mult Living                 Review of Systems UNABLE TO OBTAIN ROS DUE TO LEVEL 5 CAVEAT  Allergies  Review of patient's allergies indicates no known allergies.  Home Medications   No current outpatient prescriptions on file. BP 89/27  Pulse 80  Temp(Src) 94.3 F (34.6 C) (Rectal)   Resp 21  Ht 5' 6.14" (1.68 m)  Wt 151 lb 14.4 oz (68.9 kg)  BMI 24.41 kg/m2  SpO2 96% Vitals reviewed Physical Exam Physical Examination: General appearance - alert, ill appearing, and in no distress Mental status - alert, oriented to person, place, not to time Eyes - pupils equal and reactive, no conjunctival injection, no scleral icterus Mouth - mucous membranes moist, pharynx normal without lesions Chest - clear to auscultation, no wheezes, rales or rhonchi, symmetric air entry Heart - normal rate, regular rhythm, normal S1, S2, no murmurs, rubs, clicks or gallops Abdomen - soft, nontender, nondistended, no masses or organomegaly Extremities - peripheral pulses normal, no pedal edema, no clubbing or cyanosis Skin - normal coloration and turgor, no rashes  ED Course  Procedures (including critical care time)   6:14 PM pt with BP now 97/53, HR in 60s, she is awake and interactive.  Will place temp foley, bear hugger is in place, antibiotics are running in second peripheral IV and she is continuing to receive IV boluses.  Prior urine culture reviewed and was sensitive to zosyn, so started on zosyn in ED today.  6:44 PM d/w Dr. Conley Rolls, [facility] hospitalist- he will see patient, requests ICU consult.  D/w PCCM- they request starting on imipenem- OK with zosyn for first  dose but will add imipenm now.  They state they will follow along and suggest hospitalist admission for severe sepsis.   6:47 PM pharmacy recommends decreased dose of 250mg  imipenem q12 hours due to renal failure.  Order changed.   CRITICAL CARE Performed by: Ethelda Chick Total critical care time: 50 Critical care time was exclusive of separately billable procedures and treating other patients. Critical care was necessary to treat or prevent imminent or life-threatening deterioration. Critical care was time spent personally by me on the following activities: development of treatment plan with patient and/or surrogate as well  as nursing, discussions with consultants, evaluation of patient's response to treatment, examination of patient, obtaining history from patient or surrogate, ordering and performing treatments and interventions, ordering and review of laboratory studies, ordering and review of radiographic studies, pulse oximetry and re-evaluation of patient's condition.  Labs Reviewed  CBC - Abnormal; Notable for the following:    WBC 3.4 (*)    RBC 2.64 (*)    Hemoglobin 8.6 (*)    HCT 26.2 (*)    RDW 17.5 (*)    All other components within normal limits  COMPREHENSIVE METABOLIC PANEL - Abnormal; Notable for the following:    Sodium 129 (*)    CO2 11 (*)    Glucose, Bld 183 (*)    BUN 78 (*)    Creatinine, Ser 4.42 (*)    Calcium 7.5 (*)    Albumin 2.8 (*)    Alkaline Phosphatase 232 (*)    Total Bilirubin 0.1 (*)    GFR calc non Af Amer 10 (*)    GFR calc Af Amer 11 (*)    All other components within normal limits  URINALYSIS, ROUTINE W REFLEX MICROSCOPIC - Abnormal; Notable for the following:    APPearance TURBID (*)    Hgb urine dipstick MODERATE (*)    Protein, ur 100 (*)    Leukocytes, UA LARGE (*)    All other components within normal limits  GLUCOSE, CAPILLARY - Abnormal; Notable for the following:    Glucose-Capillary 157 (*)    All other components within normal limits  URINE MICROSCOPIC-ADD ON - Abnormal; Notable for the following:    Bacteria, UA MANY (*)    All other components within normal limits  MRSA PCR SCREENING  CULTURE, BLOOD (ROUTINE X 2)  CULTURE, BLOOD (ROUTINE X 2)  URINE CULTURE  TROPONIN I  TSH  T3  T4, FREE  CG4 I-STAT (LACTIC ACID)  PREPARE RBC (CROSSMATCH)  TYPE AND SCREEN   Dg Chest 2 View  11/09/2012   *RADIOLOGY REPORT*  Clinical Data: Altered mental status, hypotension  CHEST - 2 VIEW  Comparison: 06/11/2012  Findings: Right upper lobe is obscured by the patient's chin.  Chronic interstitial markings.  No focal consolidation is seen.  No pleural  effusion or pneumothorax.  Cardiomegaly.  Degenerative changes of the visualized thoracolumbar spine with exaggerated thoracic kyphosis.  IMPRESSION: Right upper lobe is obscured by the patient's chin.  No evidence of acute cardiopulmonary disease.   Original Report Authenticated By: Charline Bills, M.D.   Ct Head Wo Contrast  11/09/2012   *RADIOLOGY REPORT*  Clinical Data: Altered mental status.  CT HEAD WITHOUT CONTRAST  Technique:  Contiguous axial images were obtained from the base of the skull through the vertex without contrast.  Comparison: 10/21/2012  Findings: Stable ventriculomegaly.  There is mild atrophy.  Normal pressure hydrocephalus cannot be completely excluded as mentioned on prior study.  No hemorrhage.  No acute infarction.  No extra- axial fluid collection.  No acute calvarial abnormality. Visualized paranasal sinuses and mastoids clear.  Orbital soft tissues unremarkable.  IMPRESSION: Stable ventriculomegaly which may be related to mild atrophy although normal pressure hydrocephalus cannot be excluded.  No acute findings.   Original Report Authenticated By: Charlett Nose, M.D.   Dg Chest Port 1 View  11/09/2012   *RADIOLOGY REPORT*  Clinical Data: Post central line placement.  PORTABLE CHEST - 1 VIEW  Comparison: 11/09/2012.  Findings: Study is suboptimal due to severe kyphosis and difficulty in positioning.  Left central line tip appears to be in the upper SVC.  No visible pneumothorax.  IMPRESSION: Left central line tip in the upper SVC.  No pneumothorax.  Study otherwise limited due to patient positioning.   Original Report Authenticated By: Charlett Nose, M.D.   1. Urosepsis   2. Renal failure   3. Altered mental status   4. Hypotension   5. Hypothermia, initial encounter   6. Bipolar I disorder, most recent episode (or current) depressed, severe, without mention of psychotic behavior   7. Chronic kidney disease, unspecified   8. Hypothermia due to non-environmental cause, initial  encounter     MDM  Pt presenting with c/o altered mental status.  She is also hypotensive.  She has recent history of UTI with ESBL organism.  Prior culture results reviewed and ecoli was sensitive to zosyn.  Pt treated with IV hydration and started on abx, blood pressure improved somewhat.  Pt also found to be hypothermic and worsening of her baseline renal insufficiency.  Bear hugger placed on patient and temp foley ordered.  D/w [facility] and PCCM- pt to be admitted to stepdown unit with severe sepsis.    Ethelda Chick, MD 11/09/12 [phone]

## 2012-11-09 NOTE — Progress Notes (Signed)
eLink Physician-Brief Progress Note Patient Name: [name] DOB: [birth year] MRN: [id number]  Date of Service  11/09/2012   HPI/Events of Note   Hypotensive after 2.5 L Warming up 34 deg now  eICU Interventions  1L bolus Will start neo gtt if remains low -carbapenem for h/o ESBL e coli UTI   Intervention Category Intermediate Interventions: Hypotension - evaluation and management  ALVA,RAKESH V. 11/09/2012, 9:00 PM

## 2012-11-09 NOTE — ED Notes (Addendum)
Pt. Had rectal temp checked. Rectal temp was 91.1. Reported temp to Dr. Karma Ganja. Bairhugger will be placed on the pt. When return from e-xay. Nurse was notified.

## 2012-11-09 NOTE — ED Notes (Addendum)
Pt presents by EMS with c/o altered mental status and low blood pressure. Pt lives at [facility]. Per EMS, pt started a new BP medication on 6/30 (hydralizine). En route, pt's BP was 85/31 per EMS. Per a visitor at the facility, pt has been acting lethargic and confused today. Staff also advised that pt is normally up and talking and did not seem to have her normal energy today.

## 2012-11-09 NOTE — H&P (Signed)
[facility] History and Physical  [name] JYN:[id number] DOB: 12-10-1945    PCP:   Pamelia Hoit, MD   Chief Complaint: lethargic and hypotensive.  HPI: [name] is an 67 y.o. female with hx of constipation, HTN, Bipolar disorder, unsteady gait, fallen recently, presents to the ER from assisted living as she was having altered mental status and hypotension. She also has been constipated, followed by [facility]. She denied chest pain, shortness of breath. She had a mechanical fall 6/17, and at that time, had a head CT which showed stable ventriculomegaly raising ? Of NPH versus atrophic dilatation. She also was found to have AKI with Cr of 3.5, along with anemia, and had received blood transfusion recently during her last hospitalization whom I was the admitting physician.  In the ER, she was found to have hypothermia with T of 42F. , She is alert, awake and conversing. Work up in the ER included hypotension with SBP in the 70's, Cr elevated to 4.42, and Hb of 8.6 g/DL (Her recent Hb was found to be 10 g/DL.  Her UA showed UTI, and her recent culture showed ESBL and she had received fosphocycin at the recommendation of ID.  Hospitatist was asked to admit her for urosepsis.  PCCM was consulted as well.  Rewiew of Systems:  Constitutional: Negative for malaise, fever and chills. No significant weight loss or weight gain Eyes: Negative for eye pain, redness and discharge, diplopia, visual changes, or flashes of light. ENMT: Negative for ear pain, hoarseness, nasal congestion, sinus pressure and sore throat. No headaches; tinnitus, drooling, or problem swallowing. Cardiovascular: Negative for chest pain, palpitations, diaphoresis, dyspnea and peripheral edema. ; No orthopnea, PND Respiratory: Negative for cough, hemoptysis, wheezing and stridor. No pleuritic chestpain. Gastrointestinal: Negative for nausea, vomiting, diarrhea, constipation, abdominal pain, melena, blood in stool,  hematemesis, jaundice and rectal bleeding.    Genitourinary: Negative for frequency, dysuria, incontinence,flank pain and hematuria; Musculoskeletal: Negative for back pain and neck pain. Negative for swelling and trauma.;  Skin: . Negative for pruritus, rash, abrasions, bruising and skin lesion.; ulcerations Neuro: Negative for headache, lightheadedness and neck stiffness. Negative for weakness, altered level of consciousness , altered mental status, extremity weakness, burning feet, involuntary movement, seizure and syncope.  Psych: negative for anxiety, depression, insomnia, tearfulness, panic attacks, hallucinations, paranoia, suicidal or homicidal ideation    Past Medical History  Diagnosis Date  . Constipation   . Bipolar 1 disorder   . Hypertension   . Esophageal reflux   . Anxiety   . Back pain   . Osteoarthritis   . Gout   . Adenomatous polyps 03-27-07  . Diverticulosis   . Internal hemorrhoids   . Hypothyroidism   . Renal insufficiency     Past Surgical History  Procedure Laterality Date  . Cholecystectomy    . Appendectomy    . Tonsillectomy    . Tubal ligation      Medications:  HOME MEDS: Prior to Admission medications   Medication Sig Start Date End Date Taking? Authorizing Provider  acetaminophen (TYLENOL) 500 MG tablet Take 1,000 mg by mouth every 4 (four) hours as needed for pain.    Yes Historical Provider, MD  ALPRAZolam Prudy Feeler) 1 MG tablet Take 1 tablet (1 mg total) by mouth 3 (three) times daily. 11/03/12  Yes Dorothea Ogle, MD  ALPRAZolam Prudy Feeler) 1 MG tablet Take 1 tablet (1 mg total) by mouth every 6 (six) hours as needed for anxiety. 11/03/12  Yes Dorothea Ogle, MD  amLODipine (NORVASC) 10 MG tablet Take 10 mg by mouth at bedtime.    Yes Historical Provider, MD  cyclobenzaprine (FLEXERIL) 5 MG tablet Take 5 mg by mouth 3 (three) times daily as needed for muscle spasms.    Yes Historical Provider, MD  docusate sodium (COLACE) 100 MG capsule Take 200-300  mg by mouth 2 (two) times daily. 3 caps in am, 2 caps in pm   Yes Historical Provider, MD  famotidine (PEPCID) 20 MG tablet Take 20 mg by mouth 2 (two) times daily.     Yes Historical Provider, MD  guaifenesin (ROBITUSSIN) 100 MG/5ML syrup Take 200 mg by mouth 4 (four) times daily as needed for cough.    Yes Historical Provider, MD  hydrALAZINE (APRESOLINE) 25 MG tablet Take 1 tablet (25 mg total) by mouth every 8 (eight) hours. 11/03/12  Yes Dorothea Ogle, MD  lamoTRIgine (LAMICTAL) 200 MG tablet Take 200 mg by mouth 2 (two) times daily.    Yes Historical Provider, MD  levothyroxine (SYNTHROID, LEVOTHROID) 25 MCG tablet Take 25 mcg by mouth daily before breakfast.   Yes Historical Provider, MD  lubiprostone (AMITIZA) 24 MCG capsule Take 24 mcg by mouth 2 (two) times daily with a meal.   Yes Historical Provider, MD  magnesium hydroxide (MILK OF MAGNESIA) 400 MG/5ML suspension Take 30-60 mLs by mouth daily as needed for constipation.   Yes Historical Provider, MD  meclizine (ANTIVERT) 25 MG tablet Take 1 tablet (25 mg total) by mouth 3 (three) times daily as needed for dizziness. 06/11/12  Yes John L Molpus, MD  omega-3 acid ethyl esters (LOVAZA) 1 G capsule Take 4 g by mouth daily.    Yes Historical Provider, MD  Oxcarbazepine (TRILEPTAL) 300 MG tablet Take 900 mg by mouth 2 (two) times daily.    Yes Historical Provider, MD  risperiDONE (RISPERDAL) 2 MG tablet Take 4 mg by mouth 2 (two) times daily.    Yes Historical Provider, MD  sodium phosphate (FLEET) enema Place 1 enema rectally as needed (for constipation, may self-administer.).    Yes Historical Provider, MD  traMADol (ULTRAM) 50 MG tablet Take 50 mg by mouth 2 (two) times daily.   Yes Historical Provider, MD  fosfomycin (MONUROL) 3 G PACK Please take 3 g once, continue 11/03/12   Dorothea Ogle, MD     Allergies:  No Known Allergies  Social History:   reports that she quit smoking about 9 years ago. She has never used smokeless tobacco. She  reports that she does not drink alcohol or use illicit drugs.  Family History: Family History  Problem Relation Age of Onset  . Hypertension Mother   . Atrial fibrillation Father   . Colon cancer Neg Hx   . Heart disease Mother      Physical Exam: Filed Vitals:   11/09/12 1830 11/09/12 1900 11/09/12 1907 11/09/12 1930  BP: 106/35 88/46 96/51  87/40  Pulse: 70 70 71 72  Temp:  91.8 F (33.2 C) 91.9 F (33.3 C) 92.1 F (33.4 C)  TempSrc:      Resp: 16 18 17 15   SpO2: 97% 99% 99% 97%   Blood pressure 87/40, pulse 72, temperature 92.1 F (33.4 C), temperature source Rectal, resp. rate 15, SpO2 97.00%.  GEN:  Pleasant patient lying in the stretcher in no acute distress; cooperative with exam. PSYCH:  alert and oriented x4; does not appear anxious or depressed; affect is appropriate. HEENT: Mucous membranes  pink and anicteric; PERRLA; EOM intact; no cervical lymphadenopathy nor thyromegaly or carotid bruit; no JVD; There were no stridor. Neck is very supple. Breasts:: Not examined CHEST WALL: No tenderness CHEST: Normal respiration, clear to auscultation bilaterally.  HEART: Regular rate and rhythm.  There are no murmur, rub, or gallops.   BACK: No kyphosis or scoliosis; no CVA tenderness ABDOMEN: soft and non-tender; no masses, no organomegaly, normal abdominal bowel sounds; no pannus; no intertriginous candida. There is no rebound but her abdomen is large. Rectal Exam: Not done EXTREMITIES: No bone or joint deformity; age-appropriate arthropathy of the hands and knees; no edema; no ulcerations.  There is no calf tenderness. Genitalia: not examined PULSES: 2+ and symmetric SKIN: Normal hydration no rash or ulceration CNS: Cranial nerves 2-12 grossly intact no focal lateralizing neurologic deficit.  Speech is fluent; uvula elevated with phonation, facial symmetry and tongue midline. DTR are normal bilaterally, cerebella exam is intact, barbinski is negative and strengths are  equaled bilaterally.  No sensory loss.   Labs on Admission:  Basic Metabolic Panel:  Recent Labs Lab 11/09/12 1608  NA 129*  K 4.5  CL 101  CO2 11*  GLUCOSE 183*  BUN 78*  CREATININE 4.42*  CALCIUM 7.5*   Liver Function Tests:  Recent Labs Lab 11/09/12 1608  AST 19  ALT 30  ALKPHOS 232*  BILITOT 0.1*  PROT 6.2  ALBUMIN 2.8*   No results found for this basename: LIPASE, AMYLASE,  in the last 168 hours No results found for this basename: AMMONIA,  in the last 168 hours CBC:  Recent Labs Lab 11/09/12 1608  WBC 3.4*  HGB 8.6*  HCT 26.2*  MCV 99.2  PLT 150   Cardiac Enzymes:  Recent Labs Lab 11/09/12 1608  TROPONINI <0.30    CBG:  Recent Labs Lab 11/09/12 1643  GLUCAP 157*     Radiological Exams on Admission: Dg Chest 2 View  11/09/2012   *RADIOLOGY REPORT*  Clinical Data: Altered mental status, hypotension  CHEST - 2 VIEW  Comparison: 06/11/2012  Findings: Right upper lobe is obscured by the patient's chin.  Chronic interstitial markings.  No focal consolidation is seen.  No pleural effusion or pneumothorax.  Cardiomegaly.  Degenerative changes of the visualized thoracolumbar spine with exaggerated thoracic kyphosis.  IMPRESSION: Right upper lobe is obscured by the patient's chin.  No evidence of acute cardiopulmonary disease.   Original Report Authenticated By: Charline Bills, M.D.   Ct Head Wo Contrast  11/09/2012   *RADIOLOGY REPORT*  Clinical Data: Altered mental status.  CT HEAD WITHOUT CONTRAST  Technique:  Contiguous axial images were obtained from the base of the skull through the vertex without contrast.  Comparison: 10/21/2012  Findings: Stable ventriculomegaly.  There is mild atrophy.  Normal pressure hydrocephalus cannot be completely excluded as mentioned on prior study.  No hemorrhage.  No acute infarction.  No extra- axial fluid collection.  No acute calvarial abnormality. Visualized paranasal sinuses and mastoids clear.  Orbital soft tissues  unremarkable.  IMPRESSION: Stable ventriculomegaly which may be related to mild atrophy although normal pressure hydrocephalus cannot be excluded.  No acute findings.   Original Report Authenticated By: Charlett Nose, M.D.    Assessment/Plan Present on Admission:  . Urosepsis . RENAL INSUFFICIENCY, CHRONIC . RENAL FAILURE . HYPOTHYROIDISM . CONSTIPATION, CHRONIC . ANXIETY . BPLR I, DPRSD, MOST RECENT EPSD, MANIC, SVR  PLAN:  This patient is ill and she is having severe urosepsis with hypotension, ARF, hypothermia, and leukopenia.  She will need to be admitted to the ICU.  I will give her IVF, but blood transfusion would be the best.  WIll give her 1 unit of PRBC.  She has hypothyroidism, and clinically presents hypothyroid (hypothermia, bradycardia, constipation, etc...) but her hast TSH was not elevated, so I will obtain T3 and free T4 also.  She is having volume depletion with elevated BUN/Cr, and will follow her renal fx closely.  I will hold all her antiHTN medications for now as she is quite hypotensive.  Will re-introduce them as she can tolerate them.  She is a full code, and will be admitted to [facility] service.  Thank you for allowing me to participate in the care of your nice patient.  Other plans as per orders.  Code Status: FULL Unk Lightning, MD. [facility] Pager [phone] 7pm to 7am.  11/09/2012, 7:35 PM

## 2012-11-09 NOTE — ED Notes (Signed)
Pt. Had rectal temp along with vitals rechecked. Results were told to Dr.Linker. Nurse was notified.

## 2012-11-09 NOTE — Progress Notes (Signed)
ANTIBIOTIC CONSULT NOTE - INITIAL  Pharmacy Consult for Primaxin Indication: Urosepsis  No Known Allergies  Patient Measurements: Height: 5' 6.14" (168 cm) Weight: 151 lb 14.4 oz (68.9 kg) IBW/kg (Calculated) : 59.63  Vital Signs: Temp: 92.5 F (33.6 C) (07/06 2010) Temp src: Rectal (07/06 1719) BP: 94/41 mmHg (07/06 2010) Pulse Rate: 73 (07/06 2010) Intake/Output from previous day:   Intake/Output from this shift:    Labs:  Recent Labs  11/09/12 1608  WBC 3.4*  HGB 8.6*  PLT 150  CREATININE 4.42*   Estimated Creatinine Clearance: 11.9 ml/min (by C-G formula based on Cr of 4.42). No results found for this basename: Rolm Gala, VANCORANDOM, GENTTROUGH, GENTPEAK, GENTRANDOM, TOBRATROUGH, TOBRAPEAK, TOBRARND, AMIKACINPEAK, AMIKACINTROU, AMIKACIN,  in the last 72 hours   Microbiology: Recent Results (from the past 720 hour(s))  URINE CULTURE     Status: None   Collection Time    10/30/12 12:53 AM      Result Value Range Status   Specimen Description URINE, CLEAN CATCH   Final   Special Requests NONE   Final   Culture  Setup Time 10/30/2012 05:12   Final   Colony Count >=100,000 COLONIES/ML   Final   Culture     Final   Value: ESCHERICHIA COLI     Note: Confirmed Extended Spectrum Beta-Lactamase Producer (ESBL)   Report Status 11/02/2012 FINAL   Final   Organism ID, Bacteria ESCHERICHIA COLI   Final    Medical History: Past Medical History  Diagnosis Date  . Constipation   . Bipolar 1 disorder   . Hypertension   . Esophageal reflux   . Anxiety   . Back pain   . Osteoarthritis   . Gout   . Adenomatous polyps 03-27-07  . Diverticulosis   . Internal hemorrhoids   . Hypothyroidism   . Renal insufficiency     Medications:  Scheduled:  . famotidine  20 mg Oral BID  . heparin  5,000 Units Subcutaneous Q8H  . lamoTRIgine  200 mg Oral BID  . [START ON 11/10/2012] levothyroxine  25 mcg Oral QAC breakfast  . [START ON 11/10/2012] omega-3 acid ethyl  esters  4 g Oral Daily  . Oxcarbazepine  900 mg Oral BID  . risperiDONE  4 mg Oral BID  . sodium chloride  3 mL Intravenous Q12H   Infusions:  . dextrose 5 % and 0.9% NaCl 125 mL/hr at 11/09/12 2104   Assessment:  67 yr old female came to ED with symptoms of AMS and hypotension.  Recent UTI (6/26) = + ESBL e.coli  Currently hypothermic (Temp = 92.5 F)  Received Zosyn 3.375gm IV x 1 in ED @ 18:06 and Primaxin 250mg  IV x 1 in ED @ 19:02  Blood cultures x 2 and urine culture ordered  Scr = 4.42 with CrCl (n) ~ 14 ml/min  Goal of Therapy:  Eradication of infection  Plan:   Primaxin 250mg  IV q12h  Follow renal function  Follow cultures results and sensitivities  Narda Fundora, Joselyn Glassman, PharmD 11/09/2012,9:48 PM

## 2012-11-09 NOTE — Procedures (Signed)
Central Venous Catheter Insertion Procedure Note [name] [id number] [birth year]  Procedure: Insertion of Central Venous Catheter Indications: Assessment of intravascular volume, Drug and/or fluid administration and Frequent blood sampling  Procedure Details Consent: Risks of procedure as well as the alternatives and risks of each were explained to the (patient/caregiver).  Consent for procedure obtained. Time Out: Verified patient identification, verified procedure, site/side was marked, verified correct patient position, special equipment/implants available, medications/allergies/relevent history reviewed, required imaging and test results available.  Performed  Maximum sterile technique was used including antiseptics, cap, gloves, gown, hand hygiene, mask and sheet. Skin prep: Chlorhexidine; local anesthetic administered A antimicrobial bonded/coated triple lumen catheter was placed in the left internal jugular vein using the Seldinger technique.  Evaluation Blood flow good Complications: No apparent complications Patient did tolerate procedure well. Chest X-ray ordered to verify placement.  CXR: normal.  Overton Mam, M.D. Pulmonary and Critical Care Medicine Call E-link with questions [phone] 11/09/2012, 11:29 PM

## 2012-11-09 NOTE — ED Notes (Signed)
[id number]<AV> Expected date:<BR> Expected time:<BR> Means of arrival:<BR> Comments:<BR> EMS/hypotensive

## 2012-11-09 NOTE — Consult Note (Signed)
PULMONARY  / CRITICAL CARE MEDICINE CONSULT NOTE  Name: JULIUS BONIFACE MRN: [id number] DOB: Sep 09, 1945    ADMISSION DATE:  11/09/2012 CONSULTATION DATE:  11/09/2012  REFERRING MD :  Houston Siren, MD PRIMARY SERVICE: [facility]  CHIEF COMPLAINT:  Altered mental status, hypotension.   BRIEF PATIENT DESCRIPTION: 67 years old female with bipolar disorder, unsteady gait and previous falls, HTN and recent admission with UTI secondary to ESBL e. Coli.  Resident of assisted living facility. Presents with altered mental status and hypotension.   SIGNIFICANT EVENTS / STUDIES:  - CT of the head with no acute abnormalities  LINES / TUBES: - Left IJ CVL 11/09/2012  CULTURES: Urine culture pending Blood culture pending  ANTIBIOTICS: - Imipenem  HISTORY OF PRESENT ILLNESS:   67 years old female with bipolar disorder, unsteady gait and previous falls, HTN and recent admission with UTI secondary to ESBL e. Coli.  Resident of assisted living facility. Presents with altered mental status and hypotension. At admission hypothermic of 63 F, Admitted by [facility] and started on IVF resuscitation. She has persistent hypotension despite aggressive IVF resuscitation. At the time of my exam the patient is awake, alert, hypotensive with SBP to the 80's, in normal sinus rhythm, saturating 98% on 2 L [place] and breathing comfortably. She does not have any particular complains. Denies fever, CP, SOB, cough, nausea, vomiting, diarrhea, abdominal pain or urinary symptoms. Relevant laboratory data include a worsening from baseline creatinine of 4.4, WBC of 3.4, Bicarb of 11 likely due to ARF since her lactate is normal. Normal troponin and a UA consistent with UTI.   PAST MEDICAL HISTORY :  Past Medical History  Diagnosis Date  . Constipation   . Bipolar 1 disorder   . Hypertension   . Esophageal reflux   . Anxiety   . Back pain   . Osteoarthritis   . Gout   . Adenomatous polyps 03-27-07  . Diverticulosis    . Internal hemorrhoids   . Hypothyroidism   . Renal insufficiency    Past Surgical History  Procedure Laterality Date  . Cholecystectomy    . Appendectomy    . Tonsillectomy    . Tubal ligation     Prior to Admission medications   Medication Sig Start Date End Date Taking? Authorizing Provider  acetaminophen (TYLENOL) 500 MG tablet Take 1,000 mg by mouth every 4 (four) hours as needed for pain.    Yes Historical Provider, MD  ALPRAZolam Prudy Feeler) 1 MG tablet Take 1 tablet (1 mg total) by mouth 3 (three) times daily. 11/03/12  Yes Dorothea Ogle, MD  ALPRAZolam Prudy Feeler) 1 MG tablet Take 1 tablet (1 mg total) by mouth every 6 (six) hours as needed for anxiety. 11/03/12  Yes Dorothea Ogle, MD  amLODipine (NORVASC) 10 MG tablet Take 10 mg by mouth at bedtime.    Yes Historical Provider, MD  cyclobenzaprine (FLEXERIL) 5 MG tablet Take 5 mg by mouth 3 (three) times daily as needed for muscle spasms.    Yes Historical Provider, MD  docusate sodium (COLACE) 100 MG capsule Take 200-300 mg by mouth 2 (two) times daily. 3 caps in am, 2 caps in pm   Yes Historical Provider, MD  famotidine (PEPCID) 20 MG tablet Take 20 mg by mouth 2 (two) times daily.     Yes Historical Provider, MD  guaifenesin (ROBITUSSIN) 100 MG/5ML syrup Take 200 mg by mouth 4 (four) times daily as needed for cough.    Yes Historical  Provider, MD  hydrALAZINE (APRESOLINE) 25 MG tablet Take 1 tablet (25 mg total) by mouth every 8 (eight) hours. 11/03/12  Yes Dorothea Ogle, MD  lamoTRIgine (LAMICTAL) 200 MG tablet Take 200 mg by mouth 2 (two) times daily.    Yes Historical Provider, MD  levothyroxine (SYNTHROID, LEVOTHROID) 25 MCG tablet Take 25 mcg by mouth daily before breakfast.   Yes Historical Provider, MD  lubiprostone (AMITIZA) 24 MCG capsule Take 24 mcg by mouth 2 (two) times daily with a meal.   Yes Historical Provider, MD  magnesium hydroxide (MILK OF MAGNESIA) 400 MG/5ML suspension Take 30-60 mLs by mouth daily as needed for  constipation.   Yes Historical Provider, MD  meclizine (ANTIVERT) 25 MG tablet Take 1 tablet (25 mg total) by mouth 3 (three) times daily as needed for dizziness. 06/11/12  Yes John L Molpus, MD  omega-3 acid ethyl esters (LOVAZA) 1 G capsule Take 4 g by mouth daily.    Yes Historical Provider, MD  Oxcarbazepine (TRILEPTAL) 300 MG tablet Take 900 mg by mouth 2 (two) times daily.    Yes Historical Provider, MD  risperiDONE (RISPERDAL) 2 MG tablet Take 4 mg by mouth 2 (two) times daily.    Yes Historical Provider, MD  sodium phosphate (FLEET) enema Place 1 enema rectally as needed (for constipation, may self-administer.).    Yes Historical Provider, MD  traMADol (ULTRAM) 50 MG tablet Take 50 mg by mouth 2 (two) times daily.   Yes Historical Provider, MD  fosfomycin (MONUROL) 3 G PACK Please take 3 g once, continue 11/03/12   Dorothea Ogle, MD   No Known Allergies  FAMILY HISTORY:  Family History  Problem Relation Age of Onset  . Hypertension Mother   . Atrial fibrillation Father   . Colon cancer Neg Hx   . Heart disease Mother    SOCIAL HISTORY:  reports that she quit smoking about 9 years ago. She has never used smokeless tobacco. She reports that she does not drink alcohol or use illicit drugs.  REVIEW OF SYSTEMS:  Negative except for what I mentioned in the HPI.  SUBJECTIVE:   VITAL SIGNS: Temp:  [91.1 F (32.8 C)-94.3 F (34.6 C)] 94.3 F (34.6 C) (07/06 2300) Pulse Rate:  [67-80] 80 (07/06 2300) Resp:  [13-23] 21 (07/06 2300) BP: (75-106)/(27-51) 89/27 mmHg (07/06 2300) SpO2:  [96 %-99 %] 96 % (07/06 2300) Weight:  [151 lb 14.4 oz (68.9 kg)] 151 lb 14.4 oz (68.9 kg) (07/06 2143) HEMODYNAMICS:   VENTILATOR SETTINGS:   INTAKE / OUTPUT: Intake/Output     07/06 0701 - 07/07 0700   I.V. (mL/kg) 375 (5.4)   IV Piggyback 3500   Total Intake(mL/kg) 3875 (56.2)   Net +3875         PHYSICAL EXAMINATION: General: Pleasant female patient in no apparent distress. Eyes:  Anicteric sclerae. ENT: Oropharynx clear. Dry mucous membranes. No thrush Lymph: No cervical, supraclavicular, or axillary lymphadenopathy. Heart: Normal S1, S2. No murmurs, rubs, or gallops appreciated. No bruits, equal pulses. Lungs: Normal excursion, no dullness to percussion. Good air movement bilaterally, without wheezes or crackles. Normal upper airway sounds without evidence of stridor. Abdomen: Abdomen soft, non-tender and not distended, normoactive bowel sounds. No hepatosplenomegaly or masses. Musculoskeletal: No clubbing or synovitis. No edema. Skin: No rashes or lesions Neuro: No focal neurologic deficits.  LABS:  Recent Labs Lab 11/09/12 1608 11/09/12 1637  HGB 8.6*  --   WBC 3.4*  --   PLT 150  --  NA 129*  --   K 4.5  --   CL 101  --   CO2 11*  --   GLUCOSE 183*  --   BUN 78*  --   CREATININE 4.42*  --   CALCIUM 7.5*  --   AST 19  --   ALT 30  --   ALKPHOS 232*  --   BILITOT 0.1*  --   PROT 6.2  --   ALBUMIN 2.8*  --   LATICACIDVEN  --  0.96  TROPONINI <0.30  --     Recent Labs Lab 11/09/12 1643  GLUCAP 157*    CXR: No acute infiltrates.   ASSESSMENT / PLAN: I had an extensive conversation with the patient's sister who is the POA and is a Engineer, civil (consulting). She understand the patient is critically ill due to septic shock with the most likely source being the urinary tract. She decided to change the code status to DNR. She agreed to central line placement and vasopressors if needed.  Does not want endotracheal intubation, mechanical ventilation,CPR or shocks.  PULMONARY A: 1) Doing well from the pulmonary standpoint P:   - Supplemental O2 as needed to kepp O2 sat >94%  CARDIOVASCULAR A:  1) Septic shock likely secondary to UTI.  2) Anion Gap (20) metabolic acidosis, normal lactate. Likely acute on chronic renal failure. P:  - Will continue IVF resuscitation per sepsis protocol - Imipenem  - Will hold her BP medications  RENAL A:   1) Acute on  chronic renal failure 2) UTI 3) Hyponatremia P:   - Continue IVF's - Will follow CMP  GASTROINTESTINAL A:   1) no issues P: - IV pepcid   HEMATOLOGIC A:   1) anemia likely secondary to chronic disease and renal failure P:  - One unit of PRBC ordered by hospitalist - Will follow CBC  INFECTIOUS A:   1) Septic shock likely secondary to UTI 2) Recent UTI secondary to ESBL e. Coli. P:   - IVF per sepsis protocol - Imipenem  ENDOCRINE A:   1) Hyperglycemia  2) Hypothyroidism P:   - Novolog sliding scale - Continue synthroid.   NEUROLOGIC A:   1) Bipolar disorder, stable P:   - Will continue home medications.    I have personally obtained a history, examined the patient, evaluated laboratory and imaging results, formulated the assessment and plan and placed orders. CRITICAL CARE: The patient is critically ill with multiple organ systems failure and requires high complexity decision making for assessment and support, frequent evaluation and titration of therapies, application of advanced monitoring technologies and extensive interpretation of multiple databases. Critical Care Time devoted to patient care services described in this note is 60 minutes.   Overton Mam, MD Pulmonary and Critical Care Medicine [facility] Pager: [phone]  11/09/2012, 11:31 PM

## 2012-11-10 ENCOUNTER — Encounter (HOSPITAL_COMMUNITY): Payer: Self-pay

## 2012-11-10 ENCOUNTER — Inpatient Hospital Stay (HOSPITAL_COMMUNITY): Payer: Medicare Other

## 2012-11-10 DIAGNOSIS — R141 Gas pain: Secondary | ICD-10-CM

## 2012-11-10 DIAGNOSIS — Z1612 Extended spectrum beta lactamase (ESBL) resistance: Secondary | ICD-10-CM | POA: Diagnosis present

## 2012-11-10 DIAGNOSIS — N390 Urinary tract infection, site not specified: Secondary | ICD-10-CM | POA: Diagnosis present

## 2012-11-10 DIAGNOSIS — N179 Acute kidney failure, unspecified: Secondary | ICD-10-CM

## 2012-11-10 DIAGNOSIS — N189 Chronic kidney disease, unspecified: Secondary | ICD-10-CM | POA: Diagnosis present

## 2012-11-10 DIAGNOSIS — R6521 Severe sepsis with septic shock: Secondary | ICD-10-CM | POA: Diagnosis present

## 2012-11-10 LAB — CORTISOL: Cortisol, Plasma: 23.7 ug/dL

## 2012-11-10 LAB — COMPREHENSIVE METABOLIC PANEL
ALT: 31 U/L (ref 0–35)
BUN: 71 mg/dL — ABNORMAL HIGH (ref 6–23)
Calcium: 7 mg/dL — ABNORMAL LOW (ref 8.4–10.5)
Creatinine, Ser: 4.47 mg/dL — ABNORMAL HIGH (ref 0.50–1.10)
GFR calc Af Amer: 11 mL/min — ABNORMAL LOW (ref >90)
Glucose, Bld: 126 mg/dL — ABNORMAL HIGH (ref 70–99)
Sodium: 131 mEq/L — ABNORMAL LOW (ref 135–145)
Total Protein: 5.8 g/dL — ABNORMAL LOW (ref 6.0–8.3)

## 2012-11-10 LAB — BASIC METABOLIC PANEL
BUN: 71 mg/dL — ABNORMAL HIGH (ref 6–23)
Chloride: 102 mEq/L (ref 96–112)
Creatinine, Ser: 4.32 mg/dL — ABNORMAL HIGH (ref 0.50–1.10)
GFR calc Af Amer: 11 mL/min — ABNORMAL LOW (ref >90)
GFR calc non Af Amer: 10 mL/min — ABNORMAL LOW (ref >90)
Potassium: 4.5 mEq/L (ref 3.5–5.1)

## 2012-11-10 LAB — URINE CULTURE: Colony Count: >=100000

## 2012-11-10 LAB — CBC
Hemoglobin: 9.1 g/dL — ABNORMAL LOW (ref 12.0–15.0)
MCH: 31.6 pg (ref 26.0–34.0)
MCHC: 32.5 g/dL (ref 30.0–36.0)

## 2012-11-10 LAB — LIPASE, BLOOD: Lipase: 556 U/L — ABNORMAL HIGH (ref 11–59)

## 2012-11-10 LAB — TROPONIN I: Troponin I: <0.3 ng/mL (ref <0.30)

## 2012-11-10 LAB — CLOSTRIDIUM DIFFICILE BY PCR: Toxigenic C. Difficile by PCR: NEGATIVE

## 2012-11-10 LAB — TRIGLYCERIDES: Triglycerides: 63 mg/dL (ref <150)

## 2012-11-10 LAB — HEMOGLOBIN AND HEMATOCRIT, BLOOD: Hemoglobin: 8.7 g/dL — ABNORMAL LOW (ref 12.0–15.0)

## 2012-11-10 MED ORDER — SODIUM BICARBONATE 8.4 % IV SOLN
INTRAVENOUS | Status: DC
  Administered 2012-11-10 – 2012-11-12 (×5): via INTRAVENOUS
  Filled 2012-11-10 (×9): qty 150

## 2012-11-10 MED ORDER — FLUCONAZOLE 150 MG PO TABS
150.0000 mg | ORAL_TABLET | Freq: Once | ORAL | Status: AC
  Administered 2012-11-10: 150 mg via ORAL
  Filled 2012-11-10: qty 1

## 2012-11-10 MED ORDER — IOHEXOL 300 MG/ML  SOLN
25.0000 mL | INTRAMUSCULAR | Status: AC
  Administered 2012-11-10 (×2): 25 mL via ORAL

## 2012-11-10 MED ORDER — PHENOL 1.4 % MT LIQD
1.0000 | OROMUCOSAL | Status: DC | PRN
  Administered 2012-11-10: 1 via OROMUCOSAL
  Filled 2012-11-10: qty 177

## 2012-11-10 MED ORDER — ALPRAZOLAM 1 MG PO TABS
1.0000 mg | ORAL_TABLET | Freq: Once | ORAL | Status: AC
  Administered 2012-11-10: 1 mg via ORAL
  Filled 2012-11-10: qty 1

## 2012-11-10 MED ORDER — SODIUM CHLORIDE 0.9 % IV SOLN
INTRAVENOUS | Status: DC
  Administered 2012-11-10: 20 mL/h via INTRAVENOUS
  Administered 2012-11-12: 20:00:00 via INTRAVENOUS

## 2012-11-10 NOTE — Care Management Note (Signed)
    Page 1 of 1   11/10/2012     2:31:57 PM   CARE MANAGEMENT NOTE 11/10/2012  Patient:  MARDEE, CLUNE   Account Number:  [id number]  Date Initiated:  11/10/2012  Documentation initiated by:  Colleen Can  Subjective/Objective Assessment:   dx AMS, Hypotension, acute kidney injury, hypothermia     Action/Plan:   From ALF   Anticipated DC Date:  11/13/2012   Anticipated DC Plan:  SKILLED NURSING FACILITY  In-house referral  Clinical Social Worker      DC Planning Services  CM consult      Choice offered to / List presented to:             Status of service:  Completed, signed off Medicare Important Message given?   (If response is "NO", the following Medicare IM given date fields will be blank) Date Medicare IM given:   Date Additional Medicare IM given:    Discharge Disposition:    Per UR Regulation:  Reviewed for med. necessity/level of care/duration of stay  If discussed at Long Length of Stay Meetings, dates discussed:    Comments:

## 2012-11-10 NOTE — Clinical Social Work Psychosocial (Signed)
Clinical Social Work Department BRIEF PSYCHOSOCIAL ASSESSMENT 11/10/2012  Patient:  [name], [name]     Account Number:  [id number]     Admit date:  11/09/2012  Clinical Social Worker:  Jodelle Red  Date/Time:  11/10/2012 11:00 AM  Referred by:  CSW  Date Referred:  11/10/2012 Referred for  Other - See comment   Other Referral:   From ALF, may now need SNF   Interview type:  Patient Other interview type:   Sister/HCPOA- [name]    PSYCHOSOCIAL DATA Living Status:  FACILITY Admitted from facility:  [facility] Level of care:  Assisted Living Primary support name:  [name] Primary support relationship to patient:  SPOUSE Degree of support available:   good from her sister Youth worker) and her husband who lives with her at ALF    CURRENT CONCERNS Current Concerns  Post-Acute Placement   Other Concerns:   may now need SNF, but does not want to leave husband who is in same ALF.    SOCIAL WORK ASSESSMENT / PLAN CSW met with Pt and her sister to check in and assess needs due to admission from ALF. Pt was just here about one week ago and her sister reports Pt is approaching SNF needs. Sister reports many barriers to going to SNF; finances, leaving spouse (who needs ALF), husband wants to smoke and coordination of her psychiatric needs. Per sister, Pt sees Dr. Donell Beers for her psych meds. Sister feels Pt has good management of these meds currently.  Sister agrees to CSW helping with placement concerns. Pt was receiving HH services through [facility] at ALF, but the holiday weekend was a barrier to consistency. Pt shared she does not want to leave her husband. CSW provided suportive listening.   Assessment/plan status:  Psychosocial Support/Ongoing Assessment of Needs Other assessment/ plan:   assist with possible other placement options.Marland KitchenMarland KitchenMay need SNF   Information/referral to community resources:    PATIENT'S/FAMILY'S RESPONSE TO PLAN OF  CARE: Pt and sister coping adequately. They have several concerns about Pt's possible need for SNF. They are open to CSW following and assisting with d/c plan accordingly. Pt has many needs that may take extra effort to coordinate across disiplines. CSW to follow closely.   Doreen Salvage, LCSW ICU/Stepdown Clinical Social Worker [facility] Cell [phone] Hours 8am-1200pm M-F

## 2012-11-10 NOTE — Progress Notes (Signed)
eLink Physician-Brief Progress Note Patient Name: [name] DOB: Dec 06, 1945 MRN: [id number]  Date of Service  11/10/2012   HPI/Events of Note   bmet reviewed, had diarhea per Rn which is reduced Still with large NON AG , small AG acisosi  eICU Interventions  Increase bicarb drip, abg in am , bmet in am    Intervention Category Major Interventions: Acid-Base disturbance - evaluation and management  FEINSTEIN,DANIEL J. 11/10/2012, 11:45 PM

## 2012-11-10 NOTE — Progress Notes (Signed)
CRITICAL VALUE ALERT  Critical value received: CO2 10  Date of notification:  11/10/12  Time of notification:  1150  Critical value read back:yes  Nurse who received alert:  Beverely Risen  MD notified (1st page):  Dr. Craige Cotta  Time of first page:  1150  MD notified (2nd page):  Time of second page:  Responding MD:  Dr. Craige Cotta  Time MD responded:  [phone]

## 2012-11-10 NOTE — Progress Notes (Signed)
CRITICAL VALUE ALERT  Critical value received: CO2 = 9  Date of notification:  11/10/12  Time of notification:  2337  Critical value read back:yes  Nurse who received alert:  Reuel Boom RN  MD notified (1st page):  E-Link  Time of first page:  2340  MD notified (2nd page):  Time of second page:  Responding MD:  Dr Bary Richard  Time MD responded:  2342

## 2012-11-10 NOTE — Progress Notes (Signed)
[facility] PROGRESS NOTE  [name] AVW:[id number] DOB: [birth year] DOA: 11/09/2012  PCP: Pamelia Hoit, MD  Brief HPI: [name] is an 67 y.o. female with hx of constipation, HTN, Bipolar disorder, unsteady gait, fallen recently, presented to the ER from assisted living as she was having altered mental status and hypotension. She also has been constipated, followed by [facility]. She denied chest pain, shortness of breath. She had a mechanical fall 6/17, and at that time, had a head CT which showed stable ventriculomegaly raising question of NPH versus atrophic dilatation. She also was found to have AKI with Cr of 3.5, along with anemia, and had received blood transfusion recently during her last hospitalization. In the ER, she was found to have hypothermia with T of 59F. She was alert, awake and conversing. Work up in the ER included hypotension with SBP in the 70's, Cr elevated to 4.42, and Hb of 8.6 g/DL (Her recent Hb was found to be 10 g/DL. Her UA showed UTI, and her recent culture showed ESBL and she had received fosphocycin at the recommendation of ID.   Past medical history:  Past Medical History  Diagnosis Date  . Constipation   . Bipolar 1 disorder   . Hypertension   . Esophageal reflux   . Anxiety   . Back pain   . Osteoarthritis   . Gout   . Adenomatous polyps 03-27-07  . Diverticulosis   . Internal hemorrhoids   . Hypothyroidism   . Renal insufficiency     Consultants: PCCM  Procedures: Central Line Placement in Left IJ  Antibiotics: Imipenem 7/6-->  Subjective: Patient complains of abdominal pain and distension. No nausea. Complains of shortness or breath and hiccoups. Also complains of sore throat. Denies chest pain. Had a soft BM overnight.   Objective: Vital Signs  Filed Vitals:   11/10/12 0600 11/10/12 0630 11/10/12 0700 11/10/12 0730  BP: 112/25 126/34 135/44 136/36  Pulse: 91 92 90 91  Temp: 98.1 F (36.7 C) 98.1 F (36.7 C) 98.1 F  (36.7 C) 98.2 F (36.8 C)  TempSrc:      Resp: 25 25 21 24   Height:      Weight:      SpO2: 95% 94% 96% 95%    Intake/Output Summary (Last 24 hours) at 11/10/12 0824 Last data filed at 11/10/12 0800  Gross per 24 hour  Intake 6021.6 ml  Output    100 ml  Net 5921.6 ml   Filed Weights   11/09/12 2143  Weight: 68.9 kg (151 lb 14.4 oz)    General appearance: alert, cooperative and no distress Head: Normocephalic, without obvious abnormality, atraumatic Eyes: conjunctivae/corneas clear. PERRL, EOM's intact.  Back: symmetric, no curvature. ROM normal. No CVA tenderness. Resp: Decreased air entry at bases. No definite crackles. Cardio: regular rate and rhythm, S1, S2 normal, systolic murmur: holosystolic 4/6, blowing throughout the precordium, no click and no rub GI: Soft, distended, Diffusely tender without rebound guarding or rigidity. No masses or organomegaly. BS present. Extremities: extremities normal, atraumatic, no cyanosis or edema Pulses: 2+ and symmetric Skin: Skin color, texture, turgor normal. No rashes or lesions Neurologic: Alert and oriented x 3. No focal deficits.  Lab Results:  Basic Metabolic Panel:  Recent Labs Lab 11/09/12 1608  NA 129*  K 4.5  CL 101  CO2 11*  GLUCOSE 183*  BUN 78*  CREATININE 4.42*  CALCIUM 7.5*   Liver Function Tests:  Recent Labs Lab 11/09/12 1608  AST  19  ALT 30  ALKPHOS 232*  BILITOT 0.1*  PROT 6.2  ALBUMIN 2.8*   No results found for this basename: LIPASE, AMYLASE,  in the last 168 hours No results found for this basename: AMMONIA,  in the last 168 hours CBC:  Recent Labs Lab 11/09/12 1608 11/10/12 0530  WBC 3.4*  --   HGB 8.6* 8.7*  HCT 26.2* 26.8*  MCV 99.2  --   PLT 150  --    Cardiac Enzymes:  Recent Labs Lab 11/09/12 1608 11/10/12 0530  TROPONINI <0.30 <0.30   CBG:  Recent Labs Lab 11/09/12 1643  GLUCAP 157*    Recent Results (from the past 240 hour(s))  MRSA PCR SCREENING      Status: None   Collection Time    11/09/12  8:43 PM      Result Value Range Status   MRSA by PCR NEGATIVE  NEGATIVE Final   Comment:            The GeneXpert MRSA Assay (FDA     approved for NASAL specimens     only), is one component of a     comprehensive MRSA colonization     surveillance program. It is not     intended to diagnose MRSA     infection nor to guide or     monitor treatment for     MRSA infections.      Studies/Results: Dg Chest 2 View  11/09/2012   *RADIOLOGY REPORT*  Clinical Data: Altered mental status, hypotension  CHEST - 2 VIEW  Comparison: 06/11/2012  Findings: Right upper lobe is obscured by the patient's chin.  Chronic interstitial markings.  No focal consolidation is seen.  No pleural effusion or pneumothorax.  Cardiomegaly.  Degenerative changes of the visualized thoracolumbar spine with exaggerated thoracic kyphosis.  IMPRESSION: Right upper lobe is obscured by the patient's chin.  No evidence of acute cardiopulmonary disease.   Original Report Authenticated By: Charline Bills, M.D.   Ct Head Wo Contrast  11/09/2012   *RADIOLOGY REPORT*  Clinical Data: Altered mental status.  CT HEAD WITHOUT CONTRAST  Technique:  Contiguous axial images were obtained from the base of the skull through the vertex without contrast.  Comparison: 10/21/2012  Findings: Stable ventriculomegaly.  There is mild atrophy.  Normal pressure hydrocephalus cannot be completely excluded as mentioned on prior study.  No hemorrhage.  No acute infarction.  No extra- axial fluid collection.  No acute calvarial abnormality. Visualized paranasal sinuses and mastoids clear.  Orbital soft tissues unremarkable.  IMPRESSION: Stable ventriculomegaly which may be related to mild atrophy although normal pressure hydrocephalus cannot be excluded.  No acute findings.   Original Report Authenticated By: Charlett Nose, M.D.   Dg Chest Port 1 View  11/09/2012   *RADIOLOGY REPORT*  Clinical Data: Post central line  placement.  PORTABLE CHEST - 1 VIEW  Comparison: 11/09/2012.  Findings: Study is suboptimal due to severe kyphosis and difficulty in positioning.  Left central line tip appears to be in the upper SVC.  No visible pneumothorax.  IMPRESSION: Left central line tip in the upper SVC.  No pneumothorax.  Study otherwise limited due to patient positioning.   Original Report Authenticated By: Charlett Nose, M.D.    Medications:  Scheduled: . famotidine  20 mg Oral BID  . heparin  5,000 Units Subcutaneous Q8H  . imipenem-cilastatin  250 mg Intravenous Q12H  . lamoTRIgine  200 mg Oral BID  . levothyroxine  25 mcg Oral  QAC breakfast  . omega-3 acid ethyl esters  4 g Oral Daily  . Oxcarbazepine  900 mg Oral BID  . risperiDONE  4 mg Oral BID  . sodium chloride  3 mL Intravenous Q12H   Continuous: . sodium chloride    . dextrose 5 % and 0.9% NaCl 125 mL/hr (11/10/12 0754)  . norepinephrine (LEVOPHED) Adult infusion 15 mcg/min (11/10/12 0601)   NWG:[id number], phenol, sodium phosphate  Assessment/Plan:  Principal Problem:   UTI (urinary tract infection) Active Problems:   HYPOTHYROIDISM   BPLR I, DPRSD, MOST RECENT EPSD, MANIC, SVR   ANXIETY   CONSTIPATION, CHRONIC   RENAL INSUFFICIENCY, CHRONIC   Hypothermia due to non-environmental cause   ESBL (extended spectrum beta-lactamase) producing bacteria infection   Septic shock   Acute on chronic renal failure    UTI with Sepsis and shock Improved with IVF and pressors. Try to wean off pressors today. Appreciate PCCM assistance. ESBL was noted on culture from previous admission. On Imipenem.  Acute on Chronic Renal Failure On IVF. Check labs today. Monitor UO. May need renal US.  Abdominal Distension Check ABD films. May need Ct imaging as well.  History of Bipolar Disorder Stable. Monitor.  Hypothyroidism TSh was 0.6. Last adm FT4 was low. Will need to be repeated as OP. Acute illness could be influencing results. No signs or  symptoms of hypothyroidism.  Anemia Likely due to chronic disease. Required blood transfusion during previous hospitalization. No overt bleeding. Drop in hgb is likely dilutional. Monitor.  Code Status:  Partial Code DVT Prophylaxis:   Heparin Family Communication: No family at bedside. Discussed with patient. PCCM spoke to her sister last night.  Disposition Plan: Remain in SDU today as Norepi is weaned off.    LOS: 1 day   [facility]  [facility] Pager [phone] 11/10/2012, 8:24 AM  If 8PM-8AM, please contact night-coverage at [email or web address], password [facility]

## 2012-11-10 NOTE — Progress Notes (Signed)
eLink Physician-Brief Progress Note Patient Name: ADALIN VANDERPLOEG DOB: 1945/10/25 MRN: [id number]  Date of Service  11/10/2012   HPI/Events of Note     eICU Interventions  BMP now, CMP in am to follow acidosis   Intervention Category Major Interventions: Acid-Base disturbance - evaluation and management  Delvecchio Madole S. 11/10/2012, 10:53 PM

## 2012-11-10 NOTE — Progress Notes (Addendum)
PULMONARY  / CRITICAL CARE MEDICINE CONSULT NOTE  Name: [name] MRN: [id number] DOB: 11/16/1945    ADMISSION DATE:  11/09/2012 CONSULTATION DATE:  11/09/2012  REFERRING MD :  Houston Siren, MD PRIMARY SERVICE: [facility]  CHIEF COMPLAINT:  Altered mental status, hypotension.   BRIEF PATIENT DESCRIPTION: 67 years old female with bipolar disorder, unsteady gait and previous falls, HTN and recent admission with UTI secondary to ESBL e. Coli.  Resident of assisted living facility. Presents with altered mental status and hypotension.   SIGNIFICANT EVENTS / STUDIES:  7/06 - CT of the head >>with no acute abnormalities.  1 unit PRBC's  LINES / TUBES: Left IJ CVL 7/6>>  CULTURES: UC 7/6>>> BCx2 7/6>>>  C-Diff PCR 7/7>>>  ANTIBIOTICS: Imipenem 7/6>>>  SUBJECTIVE:  Remains on levophed.  Received blood overnight 1 unit.   VITAL SIGNS: Temp:  [91.1 F (32.8 C)-98.2 F (36.8 C)] 97.2 F (36.2 C) (07/07 1030) Pulse Rate:  [67-92] 85 (07/07 1030) Resp:  [13-25] 24 (07/07 1030) BP: (75-145)/(25-55) 145/55 mmHg (07/07 1030) SpO2:  [94 %-99 %] 95 % (07/07 1030) Weight:  [151 lb 14.4 oz (68.9 kg)] 151 lb 14.4 oz (68.9 kg) (07/06 2143) Room air  HEMODYNAMICS: CVP:  [12 mmHg-15 mmHg] 12 mmHg  INTAKE / OUTPUT: Intake/Output     07/06 0701 - 07/07 0700 07/07 0701 - 07/08 0700   I.V. (mL/kg) 1927.8 (28) 553.9 (8)   Blood 312.5    IV Piggyback 3600    Total Intake(mL/kg) 5840.3 (84.8) 553.9 (8)   Urine (mL/kg/hr) 100 80 (0.3)   Total Output 100 80   Net +5740.3 +473.9        Stool Occurrence 2 x 1 x     PHYSICAL EXAMINATION: General: Pleasant female patient, appears SOB ENT: Oropharynx clear. Dry mucous membranes. No thrush Heart: Normal S1, S2. No murmurs, rubs, or gallops appreciated. No bruits, equal pulses. Lungs: Mild tachypnea. Good air movement bilaterally, without wheezes or crackles. Abdomen: Abdomen soft, non-tender and not distended, normoactive bowel sounds. No  hepatosplenomegaly or masses. Musculoskeletal: No clubbing or synovitis. No edema. Skin: No rashes or lesions Neuro: No focal neurologic deficits.  LABS: CBC Recent Labs     11/09/12  1608  11/10/12  0530  11/10/12  1000  WBC  3.4*   --   5.9  HGB  8.6*  8.7*  9.1*  HCT  26.2*  26.8*  28.0*  PLT  150   --   177    Coag's No results found for this basename: APTT, INR,  in the last 72 hours  BMET Recent Labs     11/09/12  1608  11/10/12  1000  NA  129*  131*  K  4.5  5.1  CL  101  106  CO2  11*  10*  BUN  78*  71*  CREATININE  4.42*  4.47*  GLUCOSE  183*  126*    Electrolytes Recent Labs     11/09/12  1608  11/10/12  1000  CALCIUM  7.5*  7.0*    Sepsis Markers No results found for this basename: LACTICACIDVEN, PROCALCITON, O2SATVEN,  in the last 72 hours  ABG No results found for this basename: PHART, PCO2ART, PO2ART,  in the last 72 hours  Liver Enzymes Recent Labs     11/09/12  1608  11/10/12  1000  AST  19  20  ALT  30  31  ALKPHOS  232*  210*  BILITOT  0.1*  0.1*  ALBUMIN  2.8*  2.7*    Cardiac Enzymes Recent Labs     11/09/12  1608  11/10/12  0530  TROPONINI  <0.30  <0.30    Glucose Recent Labs     11/09/12  1643  GLUCAP  157*    Imaging Dg Chest 2 View  11/09/2012   *RADIOLOGY REPORT*  Clinical Data: Altered mental status, hypotension  CHEST - 2 VIEW  Comparison: 06/11/2012  Findings: Right upper lobe is obscured by the patient's chin.  Chronic interstitial markings.  No focal consolidation is seen.  No pleural effusion or pneumothorax.  Cardiomegaly.  Degenerative changes of the visualized thoracolumbar spine with exaggerated thoracic kyphosis.  IMPRESSION: Right upper lobe is obscured by the patient's chin.  No evidence of acute cardiopulmonary disease.   Original Report Authenticated By: Charline Bills, M.D.   Ct Head Wo Contrast  11/09/2012   *RADIOLOGY REPORT*  Clinical Data: Altered mental status.  CT HEAD WITHOUT CONTRAST   Technique:  Contiguous axial images were obtained from the base of the skull through the vertex without contrast.  Comparison: 10/21/2012  Findings: Stable ventriculomegaly.  There is mild atrophy.  Normal pressure hydrocephalus cannot be completely excluded as mentioned on prior study.  No hemorrhage.  No acute infarction.  No extra- axial fluid collection.  No acute calvarial abnormality. Visualized paranasal sinuses and mastoids clear.  Orbital soft tissues unremarkable.  IMPRESSION: Stable ventriculomegaly which may be related to mild atrophy although normal pressure hydrocephalus cannot be excluded.  No acute findings.   Original Report Authenticated By: Charlett Nose, M.D.   Dg Chest Port 1 View  11/09/2012   *RADIOLOGY REPORT*  Clinical Data: Post central line placement.  PORTABLE CHEST - 1 VIEW  Comparison: 11/09/2012.  Findings: Study is suboptimal due to severe kyphosis and difficulty in positioning.  Left central line tip appears to be in the upper SVC.  No visible pneumothorax.  IMPRESSION: Left central line tip in the upper SVC.  No pneumothorax.  Study otherwise limited due to patient positioning.   Original Report Authenticated By: Charlett Nose, M.D.   Dg Abd Portable 2v  11/10/2012   *RADIOLOGY REPORT*  Clinical Data: Abdominal pain and distention  PORTABLE ABDOMEN - 2 VIEW  Comparison: 01/03/2012  Findings: Moderate to marked gaseous distention of the colon is identified.  No abnormal small bowel dilatation identified.  No significant air fluid levels identified on the decubitus radiograph.  There is no free air identified.  IMPRESSION:  1.  Moderate gaseous distention of the colon which may be secondary to ileus. No evidence for high-grade small bowel obstruction.   Original Report Authenticated By: Signa Kell, M.D.       ASSESSMENT / PLAN:  PULMONARY A: Dyspnea  P:   -Supplemental O2 as needed to kepp O2 sat >94% -pulmonary hygiene in setting of kyphosis / abd  distention  CARDIOVASCULAR A:  Septic shock - likely secondary to UTI.  P:  -Will hold her BP medications -wean levophed to off for MAP >65 -check cortisol   RENAL A:   Acute on chronic renal failure Hyponatremia Non gap metabolic acidosis P:   -change to HCO3 in IV fluid -reassess BMP now, trend sr cr / UOP   GASTROINTESTINAL A:   Diarrhea Ileus  Nutrition P: -IV pepcid -CT abd pending (per [facility])   HEMATOLOGIC A:   Anemia - likely secondary to chronic disease and renal failure; transfuse 1 unit PRBC 7/06 per [facility]. P:  -  Will follow CBC -limit transfusions >> transfuse for Hb < 7 or active bleeding  INFECTIOUS A:   Septic shock likely secondary to UTI >> recent UTI secondary to ESBL e. Coli Diarrhea ?C diff Vaginal Yeast Infection  P:   -assess stool for C-diff -abx & cultures as above -Diflucan 150 mg x1 PO  ENDOCRINE A:   Hyperglycemia  Hypothyroidism P:   -Novolog sliding scale if > 200 on repeat BMP -Continue synthroid.   NEUROLOGIC A:   Bipolar disorder - stable P:   -Continue home medications.    Goals of Care Extensive conversation with the patient's sister who is the POA and is a Engineer, civil (consulting). She understand the patient is critically ill due to septic shock with the most likely source being the urinary tract. She decided to change the code status to DNR.  Does not want endotracheal intubation, mechanical ventilation,CPR or shocks.   Canary Brim, NP-C [facility] Pgr: [phone] or [phone]    I have personally obtained a history, examined the patient, evaluated laboratory and imaging results, formulated the assessment and plan and placed orders.  CRITICAL CARE: The patient is critically ill with multiple organ systems failure and requires high complexity decision making for assessment and support, frequent evaluation and titration of therapies, application of advanced monitoring technologies and extensive interpretation  of multiple databases. Critical Care Time devoted to patient care services described in this note is 35 minutes.    11/10/2012, 10:54 AM  Reviewed above, examined pt, and agree with assessment/plan.  She has septic shock likely from recurrent UTI with ESBL in prior urine cx.  She has acute renal failure and diarrhea >> these are causing non-gap acidosis.  She also has ileus.    Continue current Abx, check cortisol, add HCO3 to IV fluid, and wean off pressors as tolerated.  Updated family at bedside.  Coralyn Helling, MD [facility] Pulmonary/Critical Care 11/10/2012, 12:04 PM Pager:  [phone] After 3pm call: [phone]

## 2012-11-11 DIAGNOSIS — K859 Acute pancreatitis without necrosis or infection, unspecified: Secondary | ICD-10-CM | POA: Diagnosis present

## 2012-11-11 DIAGNOSIS — Z1619 Resistance to other specified beta lactam antibiotics: Secondary | ICD-10-CM

## 2012-11-11 DIAGNOSIS — R6521 Severe sepsis with septic shock: Secondary | ICD-10-CM

## 2012-11-11 DIAGNOSIS — I959 Hypotension, unspecified: Secondary | ICD-10-CM

## 2012-11-11 DIAGNOSIS — E872 Acidosis: Secondary | ICD-10-CM | POA: Diagnosis present

## 2012-11-11 DIAGNOSIS — A419 Sepsis, unspecified organism: Secondary | ICD-10-CM

## 2012-11-11 DIAGNOSIS — B9689 Other specified bacterial agents as the cause of diseases classified elsewhere: Secondary | ICD-10-CM

## 2012-11-11 LAB — CBC
MCHC: 33.2 g/dL (ref 30.0–36.0)
RDW: 17.9 % — ABNORMAL HIGH (ref 11.5–15.5)

## 2012-11-11 LAB — BLOOD GAS, ARTERIAL
Acid-base deficit: 16.6 mmol/L — ABNORMAL HIGH (ref 0.0–2.0)
Acid-base deficit: 9.5 mmol/L — ABNORMAL HIGH (ref 0.0–2.0)
Bicarbonate: 16.4 mEq/L — ABNORMAL LOW (ref 20.0–24.0)
Drawn by: 232811
FIO2: 0.21 %
O2 Saturation: 94.5 %
Patient temperature: 98
TCO2: 11.4 mmol/L (ref 0–100)
TCO2: 15.9 mmol/L (ref 0–100)
pCO2 arterial: 35.5 mmHg (ref 35.0–45.0)
pCO2 arterial: 38.4 mmHg (ref 35.0–45.0)
pH, Arterial: 7.125 — CL (ref 7.350–7.450)
pO2, Arterial: 90 mmHg (ref 80.0–100.0)

## 2012-11-11 LAB — TYPE AND SCREEN
ABO/RH(D): O POS
Antibody Screen: NEGATIVE
Unit division: 0

## 2012-11-11 LAB — COMPREHENSIVE METABOLIC PANEL
ALT: 27 U/L (ref 0–35)
Albumin: 2.2 g/dL — ABNORMAL LOW (ref 3.5–5.2)
Alkaline Phosphatase: 175 U/L — ABNORMAL HIGH (ref 39–117)
Calcium: 7.1 mg/dL — ABNORMAL LOW (ref 8.4–10.5)
Potassium: 3.9 mEq/L (ref 3.5–5.1)
Sodium: 130 mEq/L — ABNORMAL LOW (ref 135–145)
Total Protein: 4.9 g/dL — ABNORMAL LOW (ref 6.0–8.3)

## 2012-11-11 LAB — RENAL FUNCTION PANEL
Albumin: 2.4 g/dL — ABNORMAL LOW (ref 3.5–5.2)
BUN: 68 mg/dL — ABNORMAL HIGH (ref 6–23)
Calcium: 7.3 mg/dL — ABNORMAL LOW (ref 8.4–10.5)
Glucose, Bld: 88 mg/dL (ref 70–99)
Phosphorus: 7.9 mg/dL — ABNORMAL HIGH (ref 2.3–4.6)
Potassium: 4 mEq/L (ref 3.5–5.1)

## 2012-11-11 LAB — LIPASE, BLOOD: Lipase: 248 U/L — ABNORMAL HIGH (ref 11–59)

## 2012-11-11 MED ORDER — LEVOTHYROXINE SODIUM 50 MCG PO TABS
50.0000 ug | ORAL_TABLET | Freq: Every day | ORAL | Status: DC
  Administered 2012-11-11 – 2012-11-17 (×7): 50 ug via ORAL
  Filled 2012-11-11 (×8): qty 1

## 2012-11-11 MED ORDER — ACETAMINOPHEN 500 MG PO TABS
ORAL_TABLET | ORAL | Status: AC
  Filled 2012-11-11: qty 2

## 2012-11-11 MED ORDER — RISPERIDONE 2 MG PO TABS
4.0000 mg | ORAL_TABLET | Freq: Two times a day (BID) | ORAL | Status: DC
  Administered 2012-11-11 – 2012-11-17 (×12): 4 mg via ORAL
  Filled 2012-11-11 (×13): qty 2

## 2012-11-11 MED ORDER — HYDRALAZINE HCL 25 MG PO TABS
25.0000 mg | ORAL_TABLET | Freq: Three times a day (TID) | ORAL | Status: DC
  Administered 2012-11-11 – 2012-11-16 (×14): 25 mg via ORAL
  Filled 2012-11-11 (×18): qty 1

## 2012-11-11 MED ORDER — ALPRAZOLAM 1 MG PO TABS
1.0000 mg | ORAL_TABLET | Freq: Once | ORAL | Status: AC
  Administered 2012-11-11: 1 mg via ORAL
  Filled 2012-11-11: qty 1

## 2012-11-11 MED ORDER — SODIUM BICARBONATE 650 MG PO TABS
650.0000 mg | ORAL_TABLET | Freq: Three times a day (TID) | ORAL | Status: DC
  Administered 2012-11-11 – 2012-11-13 (×7): 650 mg via ORAL
  Filled 2012-11-11 (×9): qty 1

## 2012-11-11 MED ORDER — ALPRAZOLAM 0.5 MG PO TABS
0.5000 mg | ORAL_TABLET | Freq: Once | ORAL | Status: AC
  Administered 2012-11-11: 0.5 mg via ORAL
  Filled 2012-11-11: qty 1

## 2012-11-11 NOTE — Progress Notes (Signed)
PULMONARY  / CRITICAL CARE MEDICINE CONSULT NOTE  Name: [name] MRN: [id number] DOB: [birth year]    ADMISSION DATE:  11/09/2012 CONSULTATION DATE:  11/09/2012  REFERRING MD :  Houston Siren, MD PRIMARY SERVICE: [facility]  CHIEF COMPLAINT:  Altered mental status, hypotension.   BRIEF PATIENT DESCRIPTION: 67 years old female with bipolar disorder, unsteady gait and previous falls, HTN and recent admission with UTI secondary to ESBL e. Coli.  Resident of assisted living facility. Presents with altered mental status and hypotension.   SIGNIFICANT EVENTS: 7/06 Transfuse 1 unit PRBC 7/07 Add HCO3 to IV fluids 7/08 Off pressors, renal consulted, trial on BiPAP  STUDIES:  7/06 CT of the head >> with no acute abnormalities.  7/07 CT abd/pelvis >> b/l pleural effusions with ATX , b/l renal cortical atrophy with multi-focal calcification, diverticulosis, ?mild pancreatitis  LINES / TUBES: Left IJ CVL 7/6>>  CULTURES: UC 7/6>>> Yeast >100K colonies BCx2 7/6>>>  C-Diff PCR 7/7>>>negative  ANTIBIOTICS: Imipenem 7/6>>> Diflucan 7/7>>>7/7  SUBJECTIVE:   C/o dyspnea.  Denies chest/abdominal pain.  VITAL SIGNS: Temp:  [95 F (35 C)-98.1 F (36.7 C)] 97 F (36.1 C) (07/08 0408) Pulse Rate:  [61-89] 62 (07/08 0600) Resp:  [13-27] 16 (07/08 0600) BP: (79-164)/(24-63) 104/46 mmHg (07/08 0600) SpO2:  [94 %-97 %] 95 % (07/08 0600) Weight:  [178 lb 2.1 oz (80.8 kg)] 178 lb 2.1 oz (80.8 kg) (07/08 0200) Room air  HEMODYNAMICS: CVP:  [8 mmHg-18 mmHg] 8 mmHg  INTAKE / OUTPUT: Intake/Output     07/07 0701 - 07/08 0700 07/08 0701 - 07/09 0700   P.O. 1200    I.V. (mL/kg) 3121.9 (38.6)    Blood     IV Piggyback 100    Total Intake(mL/kg) 4421.9 (54.7)    Urine (mL/kg/hr) 630 (0.3)    Total Output 630     Net +3791.9          Stool Occurrence 6 x      PHYSICAL EXAMINATION: General: Increased WOB, accessory muscle use ENT: No sinus tenderness Heart: regular Lungs: decreased  breath sounds at bases, no wheeze Abdomen: soft, non tender, decreased distention, decreased bowel sounds Musculoskeletal: no edema Skin: No rashes Neuro: Alert, RASS 0, follows commands  LABS: CBC Recent Labs     11/09/12  1608  11/10/12  0530  11/10/12  1000  11/11/12  0410  WBC  3.4*   --   5.9  3.3*  HGB  8.6*  8.7*  9.1*  8.0*  HCT  26.2*  26.8*  28.0*  24.1*  PLT  150   --   177  139*    Coag's No results found for this basename: APTT, INR,  in the last 72 hours  BMET Recent Labs     11/10/12  1000  11/10/12  2000  11/11/12  0410  NA  131*  128*  130*  K  5.1  4.5  3.9  CL  106  102  104  CO2  10*  9*  12*  BUN  71*  71*  69*  CREATININE  4.47*  4.32*  4.34*  GLUCOSE  126*  136*  123*    Electrolytes Recent Labs     11/10/12  1000  11/10/12  2000  11/11/12  0410  CALCIUM  7.0*  7.1*  7.1*    Sepsis Markers No results found for this basename: LACTICACIDVEN, PROCALCITON, O2SATVEN,  in the last 72 hours  ABG Recent Labs  11/11/12  0405  PHART  7.125*  PCO2ART  35.5  PO2ART  71.6*    Liver Enzymes Recent Labs     11/09/12  1608  11/10/12  1000  11/11/12  0410  AST  19  20  18   ALT  30  31  27   ALKPHOS  232*  210*  175*  BILITOT  0.1*  0.1*  0.1*  ALBUMIN  2.8*  2.7*  2.2*    Cardiac Enzymes Recent Labs     11/10/12  0530  11/10/12  1237  11/10/12  2000  TROPONINI  <0.30  <0.30  <0.30    Glucose Recent Labs     11/09/12  1643  GLUCAP  157*    Imaging Ct Abdomen Pelvis Wo Contrast  11/10/2012   *RADIOLOGY REPORT*  Clinical Data: Colonic distension, pain, history of diverticulosis  CT ABDOMEN AND PELVIS WITHOUT CONTRAST  Technique:  Multidetector CT imaging of the abdomen and pelvis was performed following the standard protocol without intravenous contrast.  Comparison: 05/17/2011  Findings: There are small to moderate bilateral pleural effusions. There is bilateral posterior lung base consolidation most consistent with  passive atelectasis.  In the absence of contrast, there is limited evaluation of the solid and hollow viscera.  Allowing for this, liver and spleen appear normal. Allowing for limited evaluation in the absence of IV contrast, there does appear to be mildly increased attenuation in the retroperitoneal fat surrounding the pancreas.  The pancreas is otherwise normal.  There are no adrenal gland masses.  The gallbladder is surgically absent.  The bilateral kidneys show cortical atrophy with multi focal fine calcification.  This is stable in appearance when compared to the prior study.  There is bilateral perinephric inflammatory change, symmetric and chronic, likely not of acute clinical significance.  The there is minimal ascites in the abdomen and pelvis.  The bladder is decompressed by Foley catheter and therefore not evaluated.  Pelvic organs show no evidence of masses.  There is diverticulosis of the sigmoid colon.  There is also diverticulosis involving the descending colon.  Diverticulosis is mild to moderate.  There is no evidence of diverticulitis.  There are no abnormally dilated loops of bowel. Liquid stool is seen throughout the colon as it was previously.  Again, this is an abnormal but nonspecific finding that can be seen with diarrhea type illness. Oral contrast was administered, and is seen within the stomach, small bowel, and cecum but has not reached the distal colon at the time of imaging.  There are no acute musculoskeletal findings.  There is diffuse osteosclerosis which may be due to chronic renal insufficiency.  IMPRESSION:  1.  Bilateral pleural effusions with passive atelectasis 2.  Nonobstructive bowel gas pattern.  Liquid stool throughout the colon.  Distal colonic diverticulosis without evidence of diverticulitis.  3.  Evidence of chronic renal disease  4.  Questionable findings suggesting the possibility of mild acute pancreatitis.   Original Report Authenticated By: Esperanza Heir, M.D.    Dg Chest 2 View  11/09/2012   *RADIOLOGY REPORT*  Clinical Data: Altered mental status, hypotension  CHEST - 2 VIEW  Comparison: 06/11/2012  Findings: Right upper lobe is obscured by the patient's chin.  Chronic interstitial markings.  No focal consolidation is seen.  No pleural effusion or pneumothorax.  Cardiomegaly.  Degenerative changes of the visualized thoracolumbar spine with exaggerated thoracic kyphosis.  IMPRESSION: Right upper lobe is obscured by the patient's chin.  No evidence of acute cardiopulmonary  disease.   Original Report Authenticated By: Charline Bills, M.D.   Ct Head Wo Contrast  11/09/2012   *RADIOLOGY REPORT*  Clinical Data: Altered mental status.  CT HEAD WITHOUT CONTRAST  Technique:  Contiguous axial images were obtained from the base of the skull through the vertex without contrast.  Comparison: 10/21/2012  Findings: Stable ventriculomegaly.  There is mild atrophy.  Normal pressure hydrocephalus cannot be completely excluded as mentioned on prior study.  No hemorrhage.  No acute infarction.  No extra- axial fluid collection.  No acute calvarial abnormality. Visualized paranasal sinuses and mastoids clear.  Orbital soft tissues unremarkable.  IMPRESSION: Stable ventriculomegaly which may be related to mild atrophy although normal pressure hydrocephalus cannot be excluded.  No acute findings.   Original Report Authenticated By: Charlett Nose, M.D.   Dg Chest Port 1 View  11/09/2012   *RADIOLOGY REPORT*  Clinical Data: Post central line placement.  PORTABLE CHEST - 1 VIEW  Comparison: 11/09/2012.  Findings: Study is suboptimal due to severe kyphosis and difficulty in positioning.  Left central line tip appears to be in the upper SVC.  No visible pneumothorax.  IMPRESSION: Left central line tip in the upper SVC.  No pneumothorax.  Study otherwise limited due to patient positioning.   Original Report Authenticated By: Charlett Nose, M.D.   Dg Abd Portable 2v  11/10/2012   *RADIOLOGY  REPORT*  Clinical Data: Abdominal pain and distention  PORTABLE ABDOMEN - 2 VIEW  Comparison: 01/03/2012  Findings: Moderate to marked gaseous distention of the colon is identified.  No abnormal small bowel dilatation identified.  No significant air fluid levels identified on the decubitus radiograph.  There is no free air identified.  IMPRESSION:  1.  Moderate gaseous distention of the colon which may be secondary to ileus. No evidence for high-grade small bowel obstruction.   Original Report Authenticated By: Signa Kell, M.D.       ASSESSMENT / PLAN:  PULMONARY A: Acute respiratory distress related to acidosis, pleural effusions, abdominal distention, and hx of kyphosis. P:   -BiPAP prn -oxygen as needed to keep SpO2 > 92% -f/u CXR  CARDIOVASCULAR A: Shock >> likely related to sepsis.  Off pressors 7/08. Hx of HTN. P:  -goal even fluid balance -monitor CVP qshift  RENAL A: Acute on chronic renal failure. Hyponatremia. Non gap metabolic acidosis. P:   -continue HCO3 in IV fluid -monitor renal fx, urine outpt, electrolytes -renal consulted >> ? If she may need dialysis due to refractory acidosis  GASTROINTESTINAL A: Diarrhea >> improved. Ileus >> improved. ?mild changes of pancreatitis on CT abd >> abd exam improved 7/08.  Nutrition. P: -IV pepcid -D3 diet  HEMATOLOGIC A: Anemia of critical illness and chronic disease. Thrombocytopenia. Leukopenia. P:  -f/u CBC -transfuse for Hb < 7 -SQ heparin for DVT prevention  INFECTIOUS A: Sepsis 2nd to UTI >> hx of ESBL E. Coli. Vaginal candidiasis. P:   -D3/x imipenem  ENDOCRINE A: Hyperglycemia. Hx of hypothyroidism. P:   -SSI -continue synthroid  NEUROLOGIC A: Bipolar disorder. P:   -continue lamictal, trileptal, risperdal  Goals of Care DNR/DNI.  Discussed plan with Dr. Rito Ehrlich.  CC time 35 minutes.  Coralyn Helling, MD [facility] Pulmonary/Critical Care 11/11/2012, 8:26 AM Pager:  [phone] After  3pm call: [phone]

## 2012-11-11 NOTE — Progress Notes (Signed)
eLink Physician-Brief Progress Note Patient Name: [name] DOB: 1945/06/24 MRN: [id number]  Date of Service  11/11/2012   HPI/Events of Note   hypertension  eICU Interventions  Will add her home hydralazine and follow   Intervention Category Major Interventions: Hypertension - evaluation and management  BYRUM,ROBERT S. 11/11/2012, 7:59 PM

## 2012-11-11 NOTE — Progress Notes (Signed)
Pt refused BIPAP, demanding for BIPAP to be removed. BiPAP removed.

## 2012-11-11 NOTE — Progress Notes (Addendum)
[facility] PROGRESS NOTE  [name] ZOX:[id number] DOB: 11-01-1945 DOA: 11/09/2012  PCP: Pamelia Hoit, MD  Brief HPI: [name] is an 67 y.o. female with hx of constipation, HTN, Bipolar disorder, unsteady gait, fallen recently, presented to the ER from assisted living as she was having altered mental status and hypotension. She also has been constipated, followed by [facility]. She denied chest pain, shortness of breath. She had a mechanical fall 6/17, and at that time, had a head CT which showed stable ventriculomegaly raising question of NPH versus atrophic dilatation. She also was found to have AKI with Cr of 3.5, along with anemia, and had received blood transfusion recently during her last hospitalization. In the ER, she was found to have hypothermia with T of 26F. She was alert, awake and conversing. Work up in the ER included hypotension with SBP in the 70's, Cr elevated to 4.42, and Hb of 8.6 g/DL (Her recent Hb was found to be 10 g/DL. Her UA showed UTI, and her recent culture showed ESBL and she had received fosphocycin at the recommendation of ID.   Past medical history:  Past Medical History  Diagnosis Date  . Constipation   . Bipolar 1 disorder   . Hypertension   . Esophageal reflux   . Anxiety   . Back pain   . Osteoarthritis   . Gout   . Adenomatous polyps 03-27-07  . Diverticulosis   . Internal hemorrhoids   . Hypothyroidism   . Renal insufficiency     Consultants: PCCM  Procedures: Central Line Placement in Left IJ  Antibiotics: Imipenem 7/6-->  Subjective: Patient very drowsy this morning. Arousable but goes right back to sleep. Received xanax overnight.   Objective: Vital Signs  Filed Vitals:   11/11/12 0300 11/11/12 0408 11/11/12 0500 11/11/12 0600  BP: 130/24 101/39 98/43 104/46  Pulse: 72 65 65 62  Temp:  97 F (36.1 C)    TempSrc:  Axillary    Resp: 21 16 16 16   Height:      Weight:      SpO2: 96% 95% 94% 95%     Intake/Output Summary (Last 24 hours) at 11/11/12 0743 Last data filed at 11/11/12 8119  Gross per 24 hour  Intake 4421.91 ml  Output    630 ml  Net 3791.91 ml   Filed Weights   11/09/12 2143 11/11/12 0200  Weight: 68.9 kg (151 lb 14.4 oz) 80.8 kg (178 lb 2.1 oz)    General appearance: Drowsy. Arousable. In no distress Resp: Decreased air entry at bases. No definite crackles. Cardio: regular rate and rhythm, S1, S2 normal, systolic murmur: holosystolic 4/6, blowing throughout the precordium, no click and no rub GI: Soft, distended, Diffusely tender without rebound guarding or rigidity. No masses or organomegaly. BS present. Extremities: extremities normal, atraumatic, no cyanosis or edema Pulses: 2+ and symmetric Skin: Skin color, texture, turgor normal. No rashes or lesions Neurologic: No focal deficits.  Lab Results:  Basic Metabolic Panel:  Recent Labs Lab 11/09/12 1608 11/10/12 1000 11/10/12 2000 11/11/12 0410  NA 129* 131* 128* 130*  K 4.5 5.1 4.5 3.9  CL 101 106 102 104  CO2 11* 10* 9* 12*  GLUCOSE 183* 126* 136* 123*  BUN 78* 71* 71* 69*  CREATININE 4.42* 4.47* 4.32* 4.34*  CALCIUM 7.5* 7.0* 7.1* 7.1*   Liver Function Tests:  Recent Labs Lab 11/09/12 1608 11/10/12 1000 11/11/12 0410  AST 19 20 18   ALT 30 31 27  ALKPHOS 232* 210* 175*  BILITOT 0.1* 0.1* 0.1*  PROT 6.2 5.8* 4.9*  ALBUMIN 2.8* 2.7* 2.2*    Recent Labs Lab 11/10/12 1000 11/11/12 0410  LIPASE 556* 248*   No results found for this basename: AMMONIA,  in the last 168 hours CBC:  Recent Labs Lab 11/09/12 1608 11/10/12 0530 11/10/12 1000 11/11/12 0410  WBC 3.4*  --  5.9 3.3*  HGB 8.6* 8.7* 9.1* 8.0*  HCT 26.2* 26.8* 28.0* 24.1*  MCV 99.2  --  97.2 96.4  PLT 150  --  177 139*   Cardiac Enzymes:  Recent Labs Lab 11/09/12 1608 11/10/12 0530 11/10/12 1237 11/10/12 2000  TROPONINI <0.30 <0.30 <0.30 <0.30   CBG:  Recent Labs Lab 11/09/12 1643  GLUCAP 157*     Recent Results (from the past 240 hour(s))  URINE CULTURE     Status: None   Collection Time    11/09/12  4:22 PM      Result Value Range Status   Specimen Description URINE, CATHETERIZED   Final   Special Requests NONE   Final   Culture  Setup Time 11/09/2012 23:14   Final   Colony Count >=100,000 COLONIES/ML   Final   Culture YEAST   Final   Report Status 11/10/2012 FINAL   Final  CULTURE, BLOOD (ROUTINE X 2)     Status: None   Collection Time    11/09/12  4:50 PM      Result Value Range Status   Specimen Description BLOOD LEFT HAND   Final   Special Requests BOTTLES DRAWN AEROBIC AND ANAEROBIC 4.5CC   Final   Culture  Setup Time 11/09/2012 21:38   Final   Culture     Final   Value:        BLOOD CULTURE RECEIVED NO GROWTH TO DATE CULTURE WILL BE HELD FOR 5 DAYS BEFORE ISSUING A FINAL NEGATIVE REPORT   Report Status PENDING   Incomplete  CULTURE, BLOOD (ROUTINE X 2)     Status: None   Collection Time    11/09/12  4:58 PM      Result Value Range Status   Specimen Description BLOOD LEFT HAND   Final   Special Requests BOTTLES DRAWN AEROBIC AND ANAEROBIC 5CC   Final   Culture  Setup Time 11/09/2012 21:38   Final   Culture     Final   Value:        BLOOD CULTURE RECEIVED NO GROWTH TO DATE CULTURE WILL BE HELD FOR 5 DAYS BEFORE ISSUING A FINAL NEGATIVE REPORT   Report Status PENDING   Incomplete  MRSA PCR SCREENING     Status: None   Collection Time    11/09/12  8:43 PM      Result Value Range Status   MRSA by PCR NEGATIVE  NEGATIVE Final   Comment:            The GeneXpert MRSA Assay (FDA     approved for NASAL specimens     only), is one component of a     comprehensive MRSA colonization     surveillance program. It is not     intended to diagnose MRSA     infection nor to guide or     monitor treatment for     MRSA infections.  CLOSTRIDIUM DIFFICILE BY PCR     Status: None   Collection Time    11/10/12 11:57 AM      Result Value Range Status  C difficile by pcr  NEGATIVE  NEGATIVE Final      Studies/Results: Ct Abdomen Pelvis Wo Contrast  11/10/2012   *RADIOLOGY REPORT*  Clinical Data: Colonic distension, pain, history of diverticulosis  CT ABDOMEN AND PELVIS WITHOUT CONTRAST  Technique:  Multidetector CT imaging of the abdomen and pelvis was performed following the standard protocol without intravenous contrast.  Comparison: 05/17/2011  Findings: There are small to moderate bilateral pleural effusions. There is bilateral posterior lung base consolidation most consistent with passive atelectasis.  In the absence of contrast, there is limited evaluation of the solid and hollow viscera.  Allowing for this, liver and spleen appear normal. Allowing for limited evaluation in the absence of IV contrast, there does appear to be mildly increased attenuation in the retroperitoneal fat surrounding the pancreas.  The pancreas is otherwise normal.  There are no adrenal gland masses.  The gallbladder is surgically absent.  The bilateral kidneys show cortical atrophy with multi focal fine calcification.  This is stable in appearance when compared to the prior study.  There is bilateral perinephric inflammatory change, symmetric and chronic, likely not of acute clinical significance.  The there is minimal ascites in the abdomen and pelvis.  The bladder is decompressed by Foley catheter and therefore not evaluated.  Pelvic organs show no evidence of masses.  There is diverticulosis of the sigmoid colon.  There is also diverticulosis involving the descending colon.  Diverticulosis is mild to moderate.  There is no evidence of diverticulitis.  There are no abnormally dilated loops of bowel. Liquid stool is seen throughout the colon as it was previously.  Again, this is an abnormal but nonspecific finding that can be seen with diarrhea type illness. Oral contrast was administered, and is seen within the stomach, small bowel, and cecum but has not reached the distal colon at the time of  imaging.  There are no acute musculoskeletal findings.  There is diffuse osteosclerosis which may be due to chronic renal insufficiency.  IMPRESSION:  1.  Bilateral pleural effusions with passive atelectasis 2.  Nonobstructive bowel gas pattern.  Liquid stool throughout the colon.  Distal colonic diverticulosis without evidence of diverticulitis.  3.  Evidence of chronic renal disease  4.  Questionable findings suggesting the possibility of mild acute pancreatitis.   Original Report Authenticated By: Esperanza Heir, M.D.   Dg Chest 2 View  11/09/2012   *RADIOLOGY REPORT*  Clinical Data: Altered mental status, hypotension  CHEST - 2 VIEW  Comparison: 06/11/2012  Findings: Right upper lobe is obscured by the patient's chin.  Chronic interstitial markings.  No focal consolidation is seen.  No pleural effusion or pneumothorax.  Cardiomegaly.  Degenerative changes of the visualized thoracolumbar spine with exaggerated thoracic kyphosis.  IMPRESSION: Right upper lobe is obscured by the patient's chin.  No evidence of acute cardiopulmonary disease.   Original Report Authenticated By: Charline Bills, M.D.   Ct Head Wo Contrast  11/09/2012   *RADIOLOGY REPORT*  Clinical Data: Altered mental status.  CT HEAD WITHOUT CONTRAST  Technique:  Contiguous axial images were obtained from the base of the skull through the vertex without contrast.  Comparison: 10/21/2012  Findings: Stable ventriculomegaly.  There is mild atrophy.  Normal pressure hydrocephalus cannot be completely excluded as mentioned on prior study.  No hemorrhage.  No acute infarction.  No extra- axial fluid collection.  No acute calvarial abnormality. Visualized paranasal sinuses and mastoids clear.  Orbital soft tissues unremarkable.  IMPRESSION: Stable ventriculomegaly which may be related  to mild atrophy although normal pressure hydrocephalus cannot be excluded.  No acute findings.   Original Report Authenticated By: Charlett Nose, M.D.   Dg Chest Port 1  View  11/09/2012   *RADIOLOGY REPORT*  Clinical Data: Post central line placement.  PORTABLE CHEST - 1 VIEW  Comparison: 11/09/2012.  Findings: Study is suboptimal due to severe kyphosis and difficulty in positioning.  Left central line tip appears to be in the upper SVC.  No visible pneumothorax.  IMPRESSION: Left central line tip in the upper SVC.  No pneumothorax.  Study otherwise limited due to patient positioning.   Original Report Authenticated By: Charlett Nose, M.D.   Dg Abd Portable 2v  11/10/2012   *RADIOLOGY REPORT*  Clinical Data: Abdominal pain and distention  PORTABLE ABDOMEN - 2 VIEW  Comparison: 01/03/2012  Findings: Moderate to marked gaseous distention of the colon is identified.  No abnormal small bowel dilatation identified.  No significant air fluid levels identified on the decubitus radiograph.  There is no free air identified.  IMPRESSION:  1.  Moderate gaseous distention of the colon which may be secondary to ileus. No evidence for high-grade small bowel obstruction.   Original Report Authenticated By: Signa Kell, M.D.    Medications:  Scheduled: . famotidine  20 mg Oral BID  . heparin  5,000 Units Subcutaneous Q8H  . imipenem-cilastatin  250 mg Intravenous Q12H  . lamoTRIgine  200 mg Oral BID  . levothyroxine  25 mcg Oral QAC breakfast  . omega-3 acid ethyl esters  4 g Oral Daily  . Oxcarbazepine  900 mg Oral BID  . risperiDONE  4 mg Oral BID  . sodium chloride  3 mL Intravenous Q12H   Continuous: . sodium chloride 20 mL/hr (11/10/12 1012)  . norepinephrine (LEVOPHED) Adult infusion 5 mcg/min (11/10/12 1200)  .  sodium bicarbonate  infusion 1000 mL 125 mL/hr at 11/11/12 0145   JYN:[id number], phenol, sodium phosphate  Assessment/Plan:  Principal Problem:   UTI (urinary tract infection) Active Problems:   HYPOTHYROIDISM   BPLR I, DPRSD, MOST RECENT EPSD, MANIC, SVR   ANXIETY   CONSTIPATION, CHRONIC   RENAL INSUFFICIENCY, CHRONIC   Hypothermia due to  non-environmental cause   ESBL (extended spectrum beta-lactamase) producing bacteria infection   Septic shock   Acute on chronic renal failure    UTI with Sepsis and shock Improved with IVF. Now off pressors. Appreciate PCCM assistance. ESBL was noted on culture from previous admission. On Imipenem.  Acute Pancreatitis Etiology unclear. TG is normal. She doesn't have Gallbladder. LFT's are normal except for mildly elevated AlkP. Could be medication induced but no known offender identified.  Acute on Chronic Renal Failure with metabolic acidosis Only slight change in renal function. Continue IVF with sod bicarb. No hydronephrosis identified on CT. Monitor UO. Consult Nephrology.  Abdominal Distension ABD films suggested ileus. Ct did not show same. Distension could be related to pancreatitis. Monitor. No antidiarrheals. C diff was negative.  History of Bipolar Disorder Increased somnolence today likely from doses of Xanax that she received earlier today. Monitor. Avoid sedative agents.   Hypothyroidism TSh was 0.6. FT4 is low again. Dose of Synthroid was reduced last admission. She actually needs higher dose. Will increase dose. . Will need to be repeated as OP.   Anemia Likely due to chronic disease. Required blood transfusion during previous hospitalization. No overt bleeding. Drop in hgb is likely dilutional. Monitor. Transfuse as needed.  Code Status:  Partial Code DVT Prophylaxis:  Heparin Family Communication: No family at bedside.   Disposition Plan: Remain in SDU today.    LOS: 2 days   [facility]  [facility] Pager [phone] 11/11/2012, 7:43 AM  If 8PM-8AM, please contact night-coverage at [email or web address], password [facility]

## 2012-11-11 NOTE — Consult Note (Signed)
[name] is an 67 y.o. female with hx of known CKD.  sCreatinine 1.92 11/2009.  She has a history of hypertension and bipolar disorder.  There is a history of chronic lithium therapy and lithium toxicity in 2005 when Lithium was discontinued. She has a history of calcifications in her kidneys by imaging studies but no hs of urolithiasis. She presented to the ER from assisted living as she was having altered mental status and hypotension. She denied chest pain, shortness of breath. She had a prior  fall 6/17, and at that time, had a head CT which showed stable ventriculomegaly raising question of NPH versus atrophic dilatation. She also was found to have AKI with Cr of 4.42 compared with recent sCreat of 2.84 on November 02, 2012.  In the ER she was hypotensive with systolic BP of 70+, anemic with Hgb of 8.6 gm and had a UTI and admitted with a presumptive Dx of urosepsis.  Serum creatinine has not improved and nephrology asked to assist in management of renal issues.   Past Medical History  Diagnosis Date  . Constipation   . Bipolar 1 disorder   . Hypertension   . Esophageal reflux   . Anxiety   . Back pain   . Osteoarthritis   . Gout   . Adenomatous polyps 03-27-07  . Diverticulosis   . Internal hemorrhoids   . Hypothyroidism   . Renal insufficiency    Past Surgical History  Procedure Laterality Date  . Cholecystectomy    . Appendectomy    . Tonsillectomy    . Tubal ligation     Social History:  reports that she quit smoking about 9 years ago. She has never used smokeless tobacco. She reports that she does not drink alcohol or use illicit drugs. Allergies: No Known Allergies Family History  Problem Relation Age of Onset  . Hypertension Mother   . Atrial fibrillation Father   . Colon cancer Neg Hx   . Heart disease Mother     Medications:  Scheduled: . famotidine  20 mg Oral BID  . heparin  5,000 Units Subcutaneous Q8H  . imipenem-cilastatin  250 mg Intravenous Q12H  .  lamoTRIgine  200 mg Oral BID  . levothyroxine  50 mcg Oral QAC breakfast  . omega-3 acid ethyl esters  4 g Oral Daily  . Oxcarbazepine  900 mg Oral BID  . risperiDONE  4 mg Oral BID  . sodium chloride  3 mL Intravenous Q12H     ROS: noncontributory except as articulated in history of present illness.   Blood pressure 158/55, pulse 70, temperature 93.4 F (34.1 C), temperature source Axillary, resp. rate 20, height 5\' 6"  (1.676 m), weight 80.8 kg (178 lb 2.1 oz), SpO2 96.00%.  General appearance: alert, cooperative and appears older than stated age Head: Normocephalic, without obvious abnormality, atraumatic Eyes: negative, conjunctivae/corneas clear. PERRL, EOM's intact. Fundi benign. Throat: lips, mucosa, and tongue normal; teeth and gums normal Resp: clear to auscultation bilaterally Chest wall: no tenderness Cardio: regular rate and rhythm, S1, S2 normal, no murmur, click, rub or gallop GI: soft, non-tender; bowel sounds normal; no masses,  no organomegaly Extremities: 1+ edema Skin: Skin color, texture, turgor normal. No rashes or lesions Neurologic: Grossly normal Results for orders placed during the hospital encounter of 11/09/12 (from the past 48 hour(s))  CBC     Status: Abnormal   Collection Time    11/09/12  4:08 PM      Result Value  Range   WBC 3.4 (*) 4.0 - 10.5 K/uL   RBC 2.64 (*) 3.87 - 5.11 MIL/uL   Hemoglobin 8.6 (*) 12.0 - 15.0 g/dL   HCT 96.0 (*) 45.4 - 09.8 %   MCV 99.2  78.0 - 100.0 fL   MCH 32.6  26.0 - 34.0 pg   MCHC 32.8  30.0 - 36.0 g/dL   RDW 11.9 (*) 14.7 - 82.9 %   Platelets 150  150 - 400 K/uL  COMPREHENSIVE METABOLIC PANEL     Status: Abnormal   Collection Time    11/09/12  4:08 PM      Result Value Range   Sodium 129 (*) 135 - 145 mEq/L   Potassium 4.5  3.5 - 5.1 mEq/L   Chloride 101  96 - 112 mEq/L   CO2 11 (*) 19 - 32 mEq/L   Glucose, Bld 183 (*) 70 - 99 mg/dL   BUN 78 (*) 6 - 23 mg/dL   Creatinine, Ser 5.62 (*) 0.50 - 1.10 mg/dL    Calcium 7.5 (*) 8.4 - 10.5 mg/dL   Total Protein 6.2  6.0 - 8.3 g/dL   Albumin 2.8 (*) 3.5 - 5.2 g/dL   AST 19  0 - 37 U/L   ALT 30  0 - 35 U/L   Alkaline Phosphatase 232 (*) 39 - 117 U/L   Total Bilirubin 0.1 (*) 0.3 - 1.2 mg/dL   GFR calc non Af Amer 10 (*) >90 mL/min   GFR calc Af Amer 11 (*) >90 mL/min   Comment:            The eGFR has been calculated     using the CKD EPI equation.     This calculation has not been     validated in all clinical     situations.     eGFR's persistently     <90 mL/min signify     possible Chronic Kidney Disease.  TROPONIN I     Status: None   Collection Time    11/09/12  4:08 PM      Result Value Range   Troponin I <0.30  <0.30 ng/mL   Comment:            Due to the release kinetics of cTnI,     a negative result within the first hours     of the onset of symptoms does not rule out     myocardial infarction with certainty.     If myocardial infarction is still suspected,     repeat the test at appropriate intervals.  TSH     Status: None   Collection Time    11/09/12  4:08 PM      Result Value Range   TSH 0.618  0.350 - 4.500 uIU/mL  URINALYSIS, ROUTINE W REFLEX MICROSCOPIC     Status: Abnormal   Collection Time    11/09/12  4:22 PM      Result Value Range   Color, Urine YELLOW  YELLOW   APPearance TURBID (*) CLEAR   Specific Gravity, Urine 1.024  1.005 - 1.030   pH 5.0  5.0 - 8.0   Glucose, UA NEGATIVE  NEGATIVE mg/dL   Hgb urine dipstick MODERATE (*) NEGATIVE   Bilirubin Urine NEGATIVE  NEGATIVE   Ketones, ur NEGATIVE  NEGATIVE mg/dL   Protein, ur 130 (*) NEGATIVE mg/dL   Urobilinogen, UA 0.2  0.0 - 1.0 mg/dL   Nitrite NEGATIVE  NEGATIVE   Leukocytes,  UA LARGE (*) NEGATIVE  URINE MICROSCOPIC-ADD ON     Status: Abnormal   Collection Time    11/09/12  4:22 PM      Result Value Range   WBC, UA TOO NUMEROUS TO COUNT  <3 WBC/hpf   Bacteria, UA MANY (*) RARE   Urine-Other MANY YEAST     Comment: FIELD OBSCURED BY WBC'S  URINE  CULTURE     Status: None   Collection Time    11/09/12  4:22 PM      Result Value Range   Specimen Description URINE, CATHETERIZED     Special Requests NONE     Culture  Setup Time 11/09/2012 23:14     Colony Count >=100,000 COLONIES/ML     Culture YEAST     Report Status 11/10/2012 FINAL    CG4 I-STAT (LACTIC ACID)     Status: None   Collection Time    11/09/12  4:37 PM      Result Value Range   Lactic Acid, Venous 0.96  0.5 - 2.2 mmol/L  GLUCOSE, CAPILLARY     Status: Abnormal   Collection Time    11/09/12  4:43 PM      Result Value Range   Glucose-Capillary 157 (*) 70 - 99 mg/dL  CULTURE, BLOOD (ROUTINE X 2)     Status: None   Collection Time    11/09/12  4:50 PM      Result Value Range   Specimen Description BLOOD LEFT HAND     Special Requests BOTTLES DRAWN AEROBIC AND ANAEROBIC 4.5CC     Culture  Setup Time 11/09/2012 21:38     Culture       Value:        BLOOD CULTURE RECEIVED NO GROWTH TO DATE CULTURE WILL BE HELD FOR 5 DAYS BEFORE ISSUING A FINAL NEGATIVE REPORT   Report Status PENDING    CULTURE, BLOOD (ROUTINE X 2)     Status: None   Collection Time    11/09/12  4:58 PM      Result Value Range   Specimen Description BLOOD LEFT HAND     Special Requests BOTTLES DRAWN AEROBIC AND ANAEROBIC 5CC     Culture  Setup Time 11/09/2012 21:38     Culture       Value:        BLOOD CULTURE RECEIVED NO GROWTH TO DATE CULTURE WILL BE HELD FOR 5 DAYS BEFORE ISSUING A FINAL NEGATIVE REPORT   Report Status PENDING    TYPE AND SCREEN     Status: None   Collection Time    11/09/12  7:58 PM      Result Value Range   ABO/RH(D) O POS     Antibody Screen NEG     Sample Expiration 11/12/2012     Unit Number [id number]     Blood Component Type RBC LR PHER2     Unit division 00     Status of Unit ISSUED,FINAL     Transfusion Status OK TO TRANSFUSE     Crossmatch Result Compatible    PREPARE RBC (CROSSMATCH)     Status: None   Collection Time    11/09/12  8:00 PM      Result  Value Range   Order Confirmation ORDER PROCESSED BY BLOOD BANK    MRSA PCR SCREENING     Status: None   Collection Time    11/09/12  8:43 PM      Result Value Range  MRSA by PCR NEGATIVE  NEGATIVE   Comment:            The GeneXpert MRSA Assay (FDA     approved for NASAL specimens     only), is one component of a     comprehensive MRSA colonization     surveillance program. It is not     intended to diagnose MRSA     infection nor to guide or     monitor treatment for     MRSA infections.  T3     Status: Abnormal   Collection Time    11/10/12  5:30 AM      Result Value Range   T3, Total 16.9 (*) 80.0 - 204.0 ng/dl  T4, FREE     Status: Abnormal   Collection Time    11/10/12  5:30 AM      Result Value Range   Free T4 0.37 (*) 0.80 - 1.80 ng/dL  TROPONIN I     Status: None   Collection Time    11/10/12  5:30 AM      Result Value Range   Troponin I <0.30  <0.30 ng/mL   Comment:            Due to the release kinetics of cTnI,     a negative result within the first hours     of the onset of symptoms does not rule out     myocardial infarction with certainty.     If myocardial infarction is still suspected,     repeat the test at appropriate intervals.  HEMOGLOBIN AND HEMATOCRIT, BLOOD     Status: Abnormal   Collection Time    11/10/12  5:30 AM      Result Value Range   Hemoglobin 8.7 (*) 12.0 - 15.0 g/dL   HCT 16.1 (*) 09.6 - 04.5 %  CBC     Status: Abnormal   Collection Time    11/10/12 10:00 AM      Result Value Range   WBC 5.9  4.0 - 10.5 K/uL   RBC 2.88 (*) 3.87 - 5.11 MIL/uL   Hemoglobin 9.1 (*) 12.0 - 15.0 g/dL   HCT 40.9 (*) 81.1 - 91.4 %   MCV 97.2  78.0 - 100.0 fL   MCH 31.6  26.0 - 34.0 pg   MCHC 32.5  30.0 - 36.0 g/dL   RDW 78.2 (*) 95.6 - 21.3 %   Platelets 177  150 - 400 K/uL  COMPREHENSIVE METABOLIC PANEL     Status: Abnormal   Collection Time    11/10/12 10:00 AM      Result Value Range   Sodium 131 (*) 135 - 145 mEq/L   Potassium 5.1  3.5 -  5.1 mEq/L   Chloride 106  96 - 112 mEq/L   CO2 10 (*) 19 - 32 mEq/L   Comment: REPEATED TO VERIFY     CRITICAL RESULT CALLED TO, READ BACK BY AND VERIFIED WITH:     SEEL,S. RN AT 1150 11/10/12 BARFIELD,T   Glucose, Bld 126 (*) 70 - 99 mg/dL   BUN 71 (*) 6 - 23 mg/dL   Creatinine, Ser 0.86 (*) 0.50 - 1.10 mg/dL   Calcium 7.0 (*) 8.4 - 10.5 mg/dL   Total Protein 5.8 (*) 6.0 - 8.3 g/dL   Albumin 2.7 (*) 3.5 - 5.2 g/dL   AST 20  0 - 37 U/L   ALT 31  0 - 35 U/L  Alkaline Phosphatase 210 (*) 39 - 117 U/L   Total Bilirubin 0.1 (*) 0.3 - 1.2 mg/dL   GFR calc non Af Amer 9 (*) >90 mL/min   GFR calc Af Amer 11 (*) >90 mL/min   Comment:            The eGFR has been calculated     using the CKD EPI equation.     This calculation has not been     validated in all clinical     situations.     eGFR's persistently     <90 mL/min signify     possible Chronic Kidney Disease.  LIPASE, BLOOD     Status: Abnormal   Collection Time    11/10/12 10:00 AM      Result Value Range   Lipase 556 (*) 11 - 59 U/L  CLOSTRIDIUM DIFFICILE BY PCR     Status: None   Collection Time    11/10/12 11:57 AM      Result Value Range   C difficile by pcr NEGATIVE  NEGATIVE  TROPONIN I     Status: None   Collection Time    11/10/12 12:37 PM      Result Value Range   Troponin I <0.30  <0.30 ng/mL   Comment:            Due to the release kinetics of cTnI,     a negative result within the first hours     of the onset of symptoms does not rule out     myocardial infarction with certainty.     If myocardial infarction is still suspected,     repeat the test at appropriate intervals.  CORTISOL     Status: None   Collection Time    11/10/12 12:37 PM      Result Value Range   Cortisol, Plasma 23.7     Comment: (NOTE)     AM:  4.3 - 22.4 ug/dL     PM:  3.1 - 16.1 ug/dL  TRIGLYCERIDES     Status: None   Collection Time    11/10/12 12:37 PM      Result Value Range   Triglycerides 63  <150 mg/dL  TROPONIN I      Status: None   Collection Time    11/10/12  8:00 PM      Result Value Range   Troponin I <0.30  <0.30 ng/mL   Comment:            Due to the release kinetics of cTnI,     a negative result within the first hours     of the onset of symptoms does not rule out     myocardial infarction with certainty.     If myocardial infarction is still suspected,     repeat the test at appropriate intervals.  BASIC METABOLIC PANEL     Status: Abnormal   Collection Time    11/10/12  8:00 PM      Result Value Range   Sodium 128 (*) 135 - 145 mEq/L   Potassium 4.5  3.5 - 5.1 mEq/L   Chloride 102  96 - 112 mEq/L   CO2 9 (*) 19 - 32 mEq/L   Comment: REPEATED TO Della Goo RN AT 2335 ON [id number] BY DLONG   Glucose, Bld 136 (*) 70 - 99 mg/dL   BUN 71 (*) 6 - 23 mg/dL   Creatinine, Ser 4.09 (*)  0.50 - 1.10 mg/dL   Calcium 7.1 (*) 8.4 - 10.5 mg/dL   GFR calc non Af Amer 10 (*) >90 mL/min   GFR calc Af Amer 11 (*) >90 mL/min   Comment:            The eGFR has been calculated     using the CKD EPI equation.     This calculation has not been     validated in all clinical     situations.     eGFR's persistently     <90 mL/min signify     possible Chronic Kidney Disease.  BLOOD GAS, ARTERIAL     Status: Abnormal   Collection Time    11/11/12  4:05 AM      Result Value Range   FIO2 0.21     Delivery systems ROOM AIR     pH, Arterial 7.125 (*) 7.350 - 7.450   Comment: CRITICAL RESULT CALLED TO, READ BACK BY AND VERIFIED WITH:     DR Tyson Alias AT 1610 BY ANNALISSA BAYLOR,RRT,RCP ON 11/11/12   pCO2 arterial 35.5  35.0 - 45.0 mmHg   pO2, Arterial 71.6 (*) 80.0 - 100.0 mmHg   Bicarbonate 11.3 (*) 20.0 - 24.0 mEq/L   TCO2 11.4  0 - 100 mmol/L   Acid-base deficit 16.6 (*) 0.0 - 2.0 mmol/L   O2 Saturation 92.7     Patient temperature 98.0     Collection site BRACHIAL ARTERY     Comment: CORRECTED ON 07/08 AT 9604: PREVIOUSLY REPORTED AS REVIEWED BY RIGHT BRACHIAL   Drawn by [id number]     Sample  type ARTERIAL    CBC     Status: Abnormal   Collection Time    11/11/12  4:10 AM      Result Value Range   WBC 3.3 (*) 4.0 - 10.5 K/uL   RBC 2.50 (*) 3.87 - 5.11 MIL/uL   Hemoglobin 8.0 (*) 12.0 - 15.0 g/dL   HCT 19.1 (*) 47.8 - 29.5 %   MCV 96.4  78.0 - 100.0 fL   MCH 32.0  26.0 - 34.0 pg   MCHC 33.2  30.0 - 36.0 g/dL   RDW 62.1 (*) 30.8 - 65.7 %   Platelets 139 (*) 150 - 400 K/uL  COMPREHENSIVE METABOLIC PANEL     Status: Abnormal   Collection Time    11/11/12  4:10 AM      Result Value Range   Sodium 130 (*) 135 - 145 mEq/L   Potassium 3.9  3.5 - 5.1 mEq/L   Chloride 104  96 - 112 mEq/L   CO2 12 (*) 19 - 32 mEq/L   Glucose, Bld 123 (*) 70 - 99 mg/dL   BUN 69 (*) 6 - 23 mg/dL   Creatinine, Ser 8.46 (*) 0.50 - 1.10 mg/dL   Calcium 7.1 (*) 8.4 - 10.5 mg/dL   Total Protein 4.9 (*) 6.0 - 8.3 g/dL   Albumin 2.2 (*) 3.5 - 5.2 g/dL   AST 18  0 - 37 U/L   ALT 27  0 - 35 U/L   Alkaline Phosphatase 175 (*) 39 - 117 U/L   Total Bilirubin 0.1 (*) 0.3 - 1.2 mg/dL   GFR calc non Af Amer 10 (*) >90 mL/min   GFR calc Af Amer 11 (*) >90 mL/min   Comment:            The eGFR has been calculated     using the CKD EPI equation.  This calculation has not been     validated in all clinical     situations.     eGFR's persistently     <90 mL/min signify     possible Chronic Kidney Disease.  LIPASE, BLOOD     Status: Abnormal   Collection Time    11/11/12  4:10 AM      Result Value Range   Lipase 248 (*) 11 - 59 U/L   Ct Abdomen Pelvis Wo Contrast  11/10/2012   *RADIOLOGY REPORT*  Clinical Data: Colonic distension, pain, history of diverticulosis  CT ABDOMEN AND PELVIS WITHOUT CONTRAST  Technique:  Multidetector CT imaging of the abdomen and pelvis was performed following the standard protocol without intravenous contrast.  Comparison: 05/17/2011  Findings: There are small to moderate bilateral pleural effusions. There is bilateral posterior lung base consolidation most consistent with  passive atelectasis.  In the absence of contrast, there is limited evaluation of the solid and hollow viscera.  Allowing for this, liver and spleen appear normal. Allowing for limited evaluation in the absence of IV contrast, there does appear to be mildly increased attenuation in the retroperitoneal fat surrounding the pancreas.  The pancreas is otherwise normal.  There are no adrenal gland masses.  The gallbladder is surgically absent.  The bilateral kidneys show cortical atrophy with multi focal fine calcification.  This is stable in appearance when compared to the prior study.  There is bilateral perinephric inflammatory change, symmetric and chronic, likely not of acute clinical significance.  The there is minimal ascites in the abdomen and pelvis.  The bladder is decompressed by Foley catheter and therefore not evaluated.  Pelvic organs show no evidence of masses.  There is diverticulosis of the sigmoid colon.  There is also diverticulosis involving the descending colon.  Diverticulosis is mild to moderate.  There is no evidence of diverticulitis.  There are no abnormally dilated loops of bowel. Liquid stool is seen throughout the colon as it was previously.  Again, this is an abnormal but nonspecific finding that can be seen with diarrhea type illness. Oral contrast was administered, and is seen within the stomach, small bowel, and cecum but has not reached the distal colon at the time of imaging.  There are no acute musculoskeletal findings.  There is diffuse osteosclerosis which may be due to chronic renal insufficiency.  IMPRESSION:  1.  Bilateral pleural effusions with passive atelectasis 2.  Nonobstructive bowel gas pattern.  Liquid stool throughout the colon.  Distal colonic diverticulosis without evidence of diverticulitis.  3.  Evidence of chronic renal disease  4.  Questionable findings suggesting the possibility of mild acute pancreatitis.   Original Report Authenticated By: Esperanza Heir, M.D.    Dg Chest 2 View  11/09/2012   *RADIOLOGY REPORT*  Clinical Data: Altered mental status, hypotension  CHEST - 2 VIEW  Comparison: 06/11/2012  Findings: Right upper lobe is obscured by the patient's chin.  Chronic interstitial markings.  No focal consolidation is seen.  No pleural effusion or pneumothorax.  Cardiomegaly.  Degenerative changes of the visualized thoracolumbar spine with exaggerated thoracic kyphosis.  IMPRESSION: Right upper lobe is obscured by the patient's chin.  No evidence of acute cardiopulmonary disease.   Original Report Authenticated By: Charline Bills, M.D.   Ct Head Wo Contrast  11/09/2012   *RADIOLOGY REPORT*  Clinical Data: Altered mental status.  CT HEAD WITHOUT CONTRAST  Technique:  Contiguous axial images were obtained from the base of the skull through the vertex  without contrast.  Comparison: 10/21/2012  Findings: Stable ventriculomegaly.  There is mild atrophy.  Normal pressure hydrocephalus cannot be completely excluded as mentioned on prior study.  No hemorrhage.  No acute infarction.  No extra- axial fluid collection.  No acute calvarial abnormality. Visualized paranasal sinuses and mastoids clear.  Orbital soft tissues unremarkable.  IMPRESSION: Stable ventriculomegaly which may be related to mild atrophy although normal pressure hydrocephalus cannot be excluded.  No acute findings.   Original Report Authenticated By: Charlett Nose, M.D.   Dg Chest Port 1 View  11/09/2012   *RADIOLOGY REPORT*  Clinical Data: Post central line placement.  PORTABLE CHEST - 1 VIEW  Comparison: 11/09/2012.  Findings: Study is suboptimal due to severe kyphosis and difficulty in positioning.  Left central line tip appears to be in the upper SVC.  No visible pneumothorax.  IMPRESSION: Left central line tip in the upper SVC.  No pneumothorax.  Study otherwise limited due to patient positioning.   Original Report Authenticated By: Charlett Nose, M.D.   Dg Abd Portable 2v  11/10/2012   *RADIOLOGY  REPORT*  Clinical Data: Abdominal pain and distention  PORTABLE ABDOMEN - 2 VIEW  Comparison: 01/03/2012  Findings: Moderate to marked gaseous distention of the colon is identified.  No abnormal small bowel dilatation identified.  No significant air fluid levels identified on the decubitus radiograph.  There is no free air identified.  IMPRESSION:  1.  Moderate gaseous distention of the colon which may be secondary to ileus. No evidence for high-grade small bowel obstruction.   Original Report Authenticated By: Signa Kell, M.D.    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

## 2012-11-11 NOTE — Progress Notes (Signed)
eLink Physician-Brief Progress Note Patient Name: [name] DOB: 09/05/1945 MRN: [id number]  Date of Service  11/11/2012   HPI/Events of Note   Pt requesting xanax. She is more awake than earlier in the day, but note her xanax has been stopped due to her lethargy.    eICU Interventions  Will give her single dose, half her usual home dose   Intervention Category Intermediate Interventions: Other:  Reshard Guillet S. 11/11/2012, 10:36 PM

## 2012-11-11 NOTE — Progress Notes (Signed)
E-Link MD aware of pt PH of 7.125, no new order received. Pt currently in bed resting, alert and oriented and in no acute respiratory distress. Will continue to monitor.

## 2012-11-12 ENCOUNTER — Inpatient Hospital Stay (HOSPITAL_COMMUNITY): Payer: Medicare Other

## 2012-11-12 LAB — CBC
HCT: 24.7 % — ABNORMAL LOW (ref 36.0–46.0)
Hemoglobin: 8.5 g/dL — ABNORMAL LOW (ref 12.0–15.0)
MCH: 32.2 pg (ref 26.0–34.0)
MCHC: 34.4 g/dL (ref 30.0–36.0)
MCV: 93.6 fL (ref 78.0–100.0)
RBC: 2.64 MIL/uL — ABNORMAL LOW (ref 3.87–5.11)

## 2012-11-12 LAB — PHOSPHORUS: Phosphorus: 7.7 mg/dL — ABNORMAL HIGH (ref 2.3–4.6)

## 2012-11-12 LAB — COMPREHENSIVE METABOLIC PANEL
ALT: 23 U/L (ref 0–35)
BUN: 67 mg/dL — ABNORMAL HIGH (ref 6–23)
CO2: 24 mEq/L (ref 19–32)
Calcium: 7.8 mg/dL — ABNORMAL LOW (ref 8.4–10.5)
GFR calc Af Amer: 13 mL/min — ABNORMAL LOW (ref >90)
GFR calc non Af Amer: 11 mL/min — ABNORMAL LOW (ref >90)
Glucose, Bld: 87 mg/dL (ref 70–99)
Sodium: 141 mEq/L (ref 135–145)
Total Protein: 5.3 g/dL — ABNORMAL LOW (ref 6.0–8.3)

## 2012-11-12 LAB — LIPASE, BLOOD: Lipase: 247 U/L — ABNORMAL HIGH (ref 11–59)

## 2012-11-12 MED ORDER — POTASSIUM CHLORIDE CRYS ER 20 MEQ PO TBCR
40.0000 meq | EXTENDED_RELEASE_TABLET | Freq: Two times a day (BID) | ORAL | Status: AC
  Administered 2012-11-12 (×2): 40 meq via ORAL
  Filled 2012-11-12 (×2): qty 2

## 2012-11-12 MED ORDER — FUROSEMIDE 10 MG/ML IJ SOLN
100.0000 mg | Freq: Once | INTRAVENOUS | Status: AC
  Administered 2012-11-12: 100 mg via INTRAVENOUS
  Filled 2012-11-12: qty 10

## 2012-11-12 MED ORDER — ALPRAZOLAM 1 MG PO TABS
1.0000 mg | ORAL_TABLET | Freq: Three times a day (TID) | ORAL | Status: DC
  Administered 2012-11-12 – 2012-11-17 (×16): 1 mg via ORAL
  Filled 2012-11-12 (×16): qty 1

## 2012-11-12 MED ORDER — LABETALOL HCL 5 MG/ML IV SOLN
10.0000 mg | INTRAVENOUS | Status: DC | PRN
  Administered 2012-11-12 – 2012-11-13 (×3): 10 mg via INTRAVENOUS
  Filled 2012-11-12 (×3): qty 4

## 2012-11-12 MED ORDER — ACETAMINOPHEN 500 MG PO TABS: 1000.0000 mg | ORAL_TABLET | Freq: Four times a day (QID) | ORAL | Status: DC | PRN

## 2012-11-12 MED ORDER — SODIUM CHLORIDE 0.9 % IJ SOLN
10.0000 mL | INTRAMUSCULAR | Status: DC | PRN
  Administered 2012-11-13: 10 mL

## 2012-11-12 MED ORDER — AMLODIPINE BESYLATE 10 MG PO TABS
10.0000 mg | ORAL_TABLET | Freq: Every day | ORAL | Status: DC
  Administered 2012-11-12 – 2012-11-17 (×6): 10 mg via ORAL
  Filled 2012-11-12 (×6): qty 1

## 2012-11-12 MED ORDER — FAMOTIDINE 20 MG PO TABS
20.0000 mg | ORAL_TABLET | Freq: Every day | ORAL | Status: DC
  Administered 2012-11-13 – 2012-11-17 (×5): 20 mg via ORAL
  Filled 2012-11-12 (×5): qty 1

## 2012-11-12 MED ORDER — LABETALOL HCL 5 MG/ML IV SOLN
INTRAVENOUS | Status: AC
  Filled 2012-11-12: qty 8

## 2012-11-12 MED ORDER — SORBITOL 70 % SOLN
30.0000 mL | Freq: Every day | Status: DC | PRN
  Filled 2012-11-12: qty 30

## 2012-11-12 MED ORDER — ACETAMINOPHEN 325 MG PO TABS
650.0000 mg | ORAL_TABLET | Freq: Four times a day (QID) | ORAL | Status: DC | PRN
  Administered 2012-11-13 – 2012-11-17 (×13): 650 mg via ORAL
  Filled 2012-11-12 (×15): qty 2

## 2012-11-12 MED ORDER — SODIUM CHLORIDE 0.9 % IJ SOLN
10.0000 mL | Freq: Two times a day (BID) | INTRAMUSCULAR | Status: DC
  Administered 2012-11-12 – 2012-11-13 (×2): 10 mL

## 2012-11-12 NOTE — Progress Notes (Signed)
ANTIBIOTIC CONSULT NOTE - FOLLOW UP  Pharmacy Consult for Primaxin Indication: Urosepsis  No Known Allergies  Patient Measurements: Height: 5\' 6"  (167.6 cm) Weight: 177 lb 11.1 oz (80.6 kg) IBW/kg (Calculated) : 59.3  Vital Signs: Temp: 97.3 F (36.3 C) (07/09 1200) BP: 162/77 mmHg (07/09 1200) Pulse Rate: 67 (07/09 1200) Intake/Output from previous day: 07/08 0701 - 07/09 0700 In: 4030 [P.O.:510; I.V.:3320; IV Piggyback:200] Out: 3700 [Urine:3700]  Labs:  Recent Labs  11/10/12 1000  11/11/12 0410 11/11/12 1735 11/12/12 0600  WBC 5.9  --  3.3*  --  3.7*  HGB 9.1*  --  8.0*  --  8.5*  PLT 177  --  139*  --  159  CREATININE 4.47*  < > 4.34* 4.08* 3.84*  < > = values in this interval not displayed. Estimated Creatinine Clearance: 15.6 ml/min (by C-G formula based on Cr of 3.84).  Anti-infectives: 7/6 Zosyn x1 7/6 Primaxin >>  Assessment: 67 yoF admit on 7/6 from ALF with c/o AMS and hypotension.  Recent Urine culture from 6/26 with ESBL ecoli.  Pharmacy is asked to assist with Primaxin dosing for urosepsis.  Day #4 Primaxin.  Acute on CKD, SCr improving very slowly with CrCl ~ 15 ml/min  WBC 3.7, Temp 97.3  Urine culture with > 100,000 yeast (does not require treatment per MD).  Blood cultures are NGTD.   Goal of Therapy:  Appropriate abx dosing, eradication of infection.   Plan:   Continue Primaxin 250mg  IV q12h  Follow up renal function and cultures as available.  Lynann Beaver PharmD, BCPS Pager [phone] 11/12/2012 1:48 PM

## 2012-11-12 NOTE — Progress Notes (Signed)
Pt has watched videos for Kidney Failure video number 406 and [phone].

## 2012-11-12 NOTE — Clinical Social Work Note (Signed)
CSW following for return to ALF or possible SNF placement. CSW sent updates to [facility] this am and left message that they need to come see if she is appropriate for return or if she may need SNF at this point. Pt is eager to return to her ALF as she shares room with her husband. Pt may need stepped up HH at ALF in order to return. CSW to follow. Pt has PT pending and CSW will provide those recs to ALF once complete.  Doreen Salvage, LCSW ICU/Stepdown Clinical Social Worker [facility] Cell [phone] Hours 8am-1200pm M-F

## 2012-11-12 NOTE — Progress Notes (Signed)
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  2 UTI E.Coli (esbl) & Funguria  3 Acidosis (likely worse due to tubulointerstitial disease)  4 Hypertension  Plan:  1 Diurese  2 ESRD education, but hope for return of renal function to baseline  Subjective: Interval History: some dyspnea Objective: Vital signs in last 24 hours: Temp:  [94.8 F (34.9 C)-99.1 F (37.3 C)] 97.3 F (36.3 C) (07/09 0600) Pulse Rate:  [75-96] 81 (07/09 0600) Resp:  [17-29] 19 (07/09 0600) BP: (139-202)/(48-123) 202/77 mmHg (07/09 0600) SpO2:  [95 %-99 %] 97 % (07/09 0600) Weight:  [80.6 kg (177 lb 11.1 oz)] 80.6 kg (177 lb 11.1 oz) (07/09 0500) Weight change: -0.2 kg (-7.1 oz)  Intake/Output from previous day: 07/08 0701 - 07/09 0700 In: 4030 [P.O.:510; I.V.:3320; IV Piggyback:200] Out: 3700 [Urine:3700] Intake/Output this shift:    General appearance: alert and cooperative Resp: wheezes bilaterally Cardio: regular rate and rhythm, S1, S2 normal, no murmur, click, rub or gallop Extremities: edema 1+  Lab Results:  Recent Labs  11/11/12 0410 11/12/12 0600  WBC 3.3* 3.7*  HGB 8.0* 8.5*  HCT 24.1* 24.7*  PLT 139* 159   BMET:  Recent Labs  11/11/12 1735 11/12/12 0600  NA 134* 141  K 4.0 3.5  CL 101 104  CO2 18* 24  GLUCOSE 88 87  BUN 68* 67*  CREATININE 4.08* 3.84*  CALCIUM 7.3* 7.8*   No results found for this basename: PTH,  in the last 72 hours Iron Studies: No results found for this basename: IRON, TIBC, TRANSFERRIN, FERRITIN,  in the last 72 hours Studies/Results: Ct Abdomen Pelvis Wo Contrast  11/10/2012   *RADIOLOGY REPORT*  Clinical Data: Colonic distension, pain, history of diverticulosis  CT ABDOMEN AND PELVIS WITHOUT CONTRAST  Technique:  Multidetector CT imaging of the abdomen and pelvis was performed following the standard protocol without intravenous  contrast.  Comparison: 05/17/2011  Findings: There are small to moderate bilateral pleural effusions. There is bilateral posterior lung base consolidation most consistent with passive atelectasis.  In the absence of contrast, there is limited evaluation of the solid and hollow viscera.  Allowing for this, liver and spleen appear normal. Allowing for limited evaluation in the absence of IV contrast, there does appear to be mildly increased attenuation in the retroperitoneal fat surrounding the pancreas.  The pancreas is otherwise normal.  There are no adrenal gland masses.  The gallbladder is surgically absent.  The bilateral kidneys show cortical atrophy with multi focal fine calcification.  This is stable in appearance when compared to the prior study.  There is bilateral perinephric inflammatory change, symmetric and chronic, likely not of acute clinical significance.  The there is minimal ascites in the abdomen and pelvis.  The bladder is decompressed by Foley catheter and therefore not evaluated.  Pelvic organs show no evidence of masses.  There is diverticulosis of the sigmoid colon.  There is also diverticulosis involving the descending colon.  Diverticulosis is mild to moderate.  There is no evidence of diverticulitis.  There are no abnormally dilated loops of bowel. Liquid stool is seen throughout the colon as it was previously.  Again, this is an abnormal but nonspecific finding that can be seen with diarrhea type illness. Oral contrast was administered, and is seen within the stomach, small bowel, and cecum but has not reached the distal colon at the time of imaging.  There are  no acute musculoskeletal findings.  There is diffuse osteosclerosis which may be due to chronic renal insufficiency.  IMPRESSION:  1.  Bilateral pleural effusions with passive atelectasis 2.  Nonobstructive bowel gas pattern.  Liquid stool throughout the colon.  Distal colonic diverticulosis without evidence of diverticulitis.  3.   Evidence of chronic renal disease  4.  Questionable findings suggesting the possibility of mild acute pancreatitis.   Original Report Authenticated By: Esperanza Heir, M.D.   Dg Chest Port 1 View  11/12/2012   *RADIOLOGY REPORT*  Clinical Data: Follow-up pleural effusions.  PORTABLE CHEST - 1 VIEW  Comparison: 11/09/2012  Findings: Left central venous catheter with tip over the mid SVC region.  Cardiac enlargement with mild pulmonary vascular congestion.  No definite edema.  Small bilateral pleural effusions with minimal basilar atelectasis.  No focal consolidation.  No pneumothorax.  Calcified aorta.  IMPRESSION: Small bilateral pleural effusions with basilar atelectasis. Cardiac enlargement with pulmonary vascular congestion.   Original Report Authenticated By: Burman Nieves, M.D.   Dg Abd Portable 2v  11/10/2012   *RADIOLOGY REPORT*  Clinical Data: Abdominal pain and distention  PORTABLE ABDOMEN - 2 VIEW  Comparison: 01/03/2012  Findings: Moderate to marked gaseous distention of the colon is identified.  No abnormal small bowel dilatation identified.  No significant air fluid levels identified on the decubitus radiograph.  There is no free air identified.  IMPRESSION:  1.  Moderate gaseous distention of the colon which may be secondary to ileus. No evidence for high-grade small bowel obstruction.   Original Report Authenticated By: Signa Kell, M.D.   Scheduled: . acetaminophen      . ALPRAZolam  1 mg Oral Q8H  . amLODipine  10 mg Oral Daily  . famotidine  20 mg Oral BID  . heparin  5,000 Units Subcutaneous Q8H  . hydrALAZINE  25 mg Oral Q8H  . imipenem-cilastatin  250 mg Intravenous Q12H  . labetalol      . lamoTRIgine  200 mg Oral BID  . levothyroxine  50 mcg Oral QAC breakfast  . omega-3 acid ethyl esters  4 g Oral Daily  . Oxcarbazepine  900 mg Oral BID  . risperiDONE  4 mg Oral BID  . sodium bicarbonate  650 mg Oral TID  . sodium chloride  3 mL Intravenous Q12H     LOS: 3 days    Larence Thone C 11/12/2012,9:00 AM

## 2012-11-12 NOTE — Progress Notes (Signed)
PULMONARY  / CRITICAL CARE MEDICINE CONSULT NOTE  Name: [name] MRN: [id number] DOB: 1946-04-10    ADMISSION DATE:  11/09/2012 CONSULTATION DATE:  11/09/2012  REFERRING MD :  Houston Siren, MD PRIMARY SERVICE: [facility]  CHIEF COMPLAINT:  Altered mental status, hypotension.   BRIEF PATIENT DESCRIPTION: 67 years old female with bipolar disorder, unsteady gait and previous falls, HTN and recent admission with UTI secondary to ESBL e. Coli.  Resident of assisted living facility. Presents with altered mental status and hypotension.   SIGNIFICANT EVENTS: 7/06 Transfuse 1 unit PRBC 7/07 Add HCO3 to IV fluids 7/08 Off pressors, renal consulted, trial on BiPAP 7/09 Unable to tolerate BiPAP  STUDIES:  7/06 CT of the head >> with no acute abnormalities.  7/07 CT abd/pelvis >> b/l pleural effusions with ATX , b/l renal cortical atrophy with multi-focal calcification, diverticulosis, ?mild pancreatitis  LINES / TUBES: Left IJ CVL 7/6>>  CULTURES: UC 7/6>>> Yeast >100K colonies BCx2 7/6>>>  C-Diff PCR 7/7>>>negative  ANTIBIOTICS: Imipenem 7/6>>> Diflucan 7/7>>>7/7  SUBJECTIVE:   Feels anxious and can't sleep.  Wants her xanax.  VITAL SIGNS: Temp:  [93.4 F (34.1 C)-99.1 F (37.3 C)] 97.3 F (36.3 C) (07/09 0600) Pulse Rate:  [70-96] 81 (07/09 0600) Resp:  [17-29] 19 (07/09 0600) BP: (139-202)/(48-123) 202/77 mmHg (07/09 0600) SpO2:  [94 %-99 %] 97 % (07/09 0600) Weight:  [177 lb 11.1 oz (80.6 kg)] 177 lb 11.1 oz (80.6 kg) (07/09 0500) Room air  HEMODYNAMICS: CVP:  [13 mmHg-21 mmHg] 13 mmHg  INTAKE / OUTPUT: Intake/Output     07/08 0701 - 07/09 0700 07/09 0701 - 07/10 0700   P.O. 510    I.V. (mL/kg) 3260 (40.4)    IV Piggyback 200    Total Intake(mL/kg) 3970 (49.3)    Urine (mL/kg/hr) 3700 (1.9)    Total Output 3700     Net +270          Stool Occurrence 2 x      PHYSICAL EXAMINATION: General: Anxious ENT: No sinus tenderness Heart: regular Lungs:  decreased breath sounds at bases, no wheeze Abdomen: soft, non tender, decreased distention, decreased bowel sounds Musculoskeletal: no edema Skin: No rashes Neuro: Alert, follows commands  LABS: CBC Recent Labs     11/10/12  1000  11/11/12  0410  11/12/12  0600  WBC  5.9  3.3*  3.7*  HGB  9.1*  8.0*  8.5*  HCT  28.0*  24.1*  24.7*  PLT  177  139*  159    Coag's No results found for this basename: APTT, INR,  in the last 72 hours  BMET Recent Labs     11/11/12  0410  11/11/12  1735  11/12/12  0600  NA  130*  134*  141  K  3.9  4.0  3.5  CL  104  101  104  CO2  12*  18*  24  BUN  69*  68*  67*  CREATININE  4.34*  4.08*  3.84*  GLUCOSE  123*  88  87    Electrolytes Recent Labs     11/11/12  0410  11/11/12  1735  11/12/12  0600  CALCIUM  7.1*  7.3*  7.8*  PHOS   --   7.9*  7.7*    Sepsis Markers No results found for this basename: LACTICACIDVEN, PROCALCITON, O2SATVEN,  in the last 72 hours  ABG Recent Labs     11/11/12  0405  11/11/12  1802  PHART  7.125*  7.255*  PCO2ART  35.5  38.4  PO2ART  71.6*  90.0    Liver Enzymes Recent Labs     11/10/12  1000  11/11/12  0410  11/11/12  1735  11/12/12  0600  AST  20  18   --   17  ALT  31  27   --   23  ALKPHOS  210*  175*   --   181*  BILITOT  0.1*  0.1*   --   0.1*  ALBUMIN  2.7*  2.2*  2.4*  2.3*    Cardiac Enzymes Recent Labs     11/10/12  0530  11/10/12  1237  11/10/12  2000  TROPONINI  <0.30  <0.30  <0.30    Glucose Recent Labs     11/09/12  1643  GLUCAP  157*    Imaging Ct Abdomen Pelvis Wo Contrast  11/10/2012   *RADIOLOGY REPORT*  Clinical Data: Colonic distension, pain, history of diverticulosis  CT ABDOMEN AND PELVIS WITHOUT CONTRAST  Technique:  Multidetector CT imaging of the abdomen and pelvis was performed following the standard protocol without intravenous contrast.  Comparison: 05/17/2011  Findings: There are small to moderate bilateral pleural effusions. There is  bilateral posterior lung base consolidation most consistent with passive atelectasis.  In the absence of contrast, there is limited evaluation of the solid and hollow viscera.  Allowing for this, liver and spleen appear normal. Allowing for limited evaluation in the absence of IV contrast, there does appear to be mildly increased attenuation in the retroperitoneal fat surrounding the pancreas.  The pancreas is otherwise normal.  There are no adrenal gland masses.  The gallbladder is surgically absent.  The bilateral kidneys show cortical atrophy with multi focal fine calcification.  This is stable in appearance when compared to the prior study.  There is bilateral perinephric inflammatory change, symmetric and chronic, likely not of acute clinical significance.  The there is minimal ascites in the abdomen and pelvis.  The bladder is decompressed by Foley catheter and therefore not evaluated.  Pelvic organs show no evidence of masses.  There is diverticulosis of the sigmoid colon.  There is also diverticulosis involving the descending colon.  Diverticulosis is mild to moderate.  There is no evidence of diverticulitis.  There are no abnormally dilated loops of bowel. Liquid stool is seen throughout the colon as it was previously.  Again, this is an abnormal but nonspecific finding that can be seen with diarrhea type illness. Oral contrast was administered, and is seen within the stomach, small bowel, and cecum but has not reached the distal colon at the time of imaging.  There are no acute musculoskeletal findings.  There is diffuse osteosclerosis which may be due to chronic renal insufficiency.  IMPRESSION:  1.  Bilateral pleural effusions with passive atelectasis 2.  Nonobstructive bowel gas pattern.  Liquid stool throughout the colon.  Distal colonic diverticulosis without evidence of diverticulitis.  3.  Evidence of chronic renal disease  4.  Questionable findings suggesting the possibility of mild acute  pancreatitis.   Original Report Authenticated By: Esperanza Heir, M.D.   Dg Chest Port 1 View  11/12/2012   *RADIOLOGY REPORT*  Clinical Data: Follow-up pleural effusions.  PORTABLE CHEST - 1 VIEW  Comparison: 11/09/2012  Findings: Left central venous catheter with tip over the mid SVC region.  Cardiac enlargement with mild pulmonary vascular congestion.  No definite edema.  Small bilateral pleural effusions with  minimal basilar atelectasis.  No focal consolidation.  No pneumothorax.  Calcified aorta.  IMPRESSION: Small bilateral pleural effusions with basilar atelectasis. Cardiac enlargement with pulmonary vascular congestion.   Original Report Authenticated By: Burman Nieves, M.D.   Dg Abd Portable 2v  11/10/2012   *RADIOLOGY REPORT*  Clinical Data: Abdominal pain and distention  PORTABLE ABDOMEN - 2 VIEW  Comparison: 01/03/2012  Findings: Moderate to marked gaseous distention of the colon is identified.  No abnormal small bowel dilatation identified.  No significant air fluid levels identified on the decubitus radiograph.  There is no free air identified.  IMPRESSION:  1.  Moderate gaseous distention of the colon which may be secondary to ileus. No evidence for high-grade small bowel obstruction.   Original Report Authenticated By: Signa Kell, M.D.       ASSESSMENT / PLAN:  PULMONARY A: Acute respiratory distress related to acidosis, pleural effusions, abdominal distention, and hx of kyphosis. P:   -d/c BiPAP >> unable to tolerate -oxygen as needed to keep SpO2 > 92% -f/u CXR as needed  CARDIOVASCULAR A: Shock >> likely related to sepsis.  Off pressors 7/08. Hx of HTN. P:  -goal even fluid balance -d/c CVP checks -anti-HTN meds per primary team and renal  RENAL A: Acute on chronic renal failure >> improving. Hyponatremia >> resolved. Non gap metabolic acidosis >> improving. P:   -defer adjustments of HCO3 in IV fluid to renal -monitor renal fx, urine outpt,  electrolytes  GASTROINTESTINAL A: Diarrhea >> improved. Ileus >> improved. ?mild changes of pancreatitis on CT abd >> abd exam improved 7/08.  Nutrition. Hx of GERD. P: -pepcid  -D3 diet  HEMATOLOGIC A: Anemia of critical illness and chronic disease. Thrombocytopenia. Leukopenia. P:  -f/u CBC -transfuse for Hb < 7 -SQ heparin for DVT prevention  INFECTIOUS A: Sepsis 2nd to UTI >> hx of ESBL E. Coli. Vaginal candidiasis. P:   -D4/x imipenem  ENDOCRINE A: Hyperglycemia. Hx of hypothyroidism. P:   -monitor blood sugar on BMET -continue synthroid  NEUROLOGIC A: Bipolar disorder. P:   -continue lamictal, trileptal, risperdal -restart xanax  Goals of Care DNR/DNI.  PCCM will sign off.  Coralyn Helling, MD [facility] Pulmonary/Critical Care 11/12/2012, 7:43 AM Pager:  [phone] After 3pm call: [phone]

## 2012-11-12 NOTE — Progress Notes (Signed)
eLink Physician-Brief Progress Note Patient Name: [name] DOB: [birth year] MRN: [id number]  Date of Service  11/12/2012   HPI/Events of Note  F/U labs show ongoing metabolic acidosis in the setting of advanced renal insufficiency.  On bicarb gtt and oral bicarb added.  Also with elevated phos.   eICU Interventions  Plan: Continue with bicarb gtt through the night.  Will need to consider d/c of this since on oral bicarb as well. PRN fleets enema d/ced due to phos load PRN sorbitol 70% ordered for constipation   Intervention Category Major Interventions: Acid-Base disturbance - evaluation and management  DETERDING,ELIZABETH 11/12/2012, 12:53 AM

## 2012-11-12 NOTE — Progress Notes (Signed)
eLink Physician-Brief Progress Note Patient Name: CARDELIA SASSANO DOB: Sep 24, 1945 MRN: [id number]  Date of Service  11/12/2012   HPI/Events of Note  Patient with ongoing hypertension with current BP of 182/85 (120).  Has no prns for BP control and is on hydralazine.  2 liters of UOP with diuretic but remains net positive on bicarb gtt and oral bicarb.  CO2 is now 18 up from 12.  HA unrelived with tylenol and tylenol order is q4 hours 1 gm.   eICU Interventions  Plan: PRN labetalol for BP control Reduce bicarb gtt to kvo - continue oral bicarb Change tylenol to q6 hours  Monitor HA - if unrelieved by reducing BP consider alternative medication.   Intervention Category Intermediate Interventions: Hypertension - evaluation and management;Pain - evaluation and management  Ayn Domangue 11/12/2012, 6:25 AM

## 2012-11-12 NOTE — Progress Notes (Addendum)
[facility] PROGRESS NOTE  [name] AVW:[id number] DOB: [birth year] DOA: 11/09/2012 PCP: Pamelia Hoit, MD  Brief narrative 67 y.o. female with hx of constipation, HTN, Bipolar disorder, unsteady gait-ambulates with walker, fallen recently, presented to the ER from assisted living with altered mental status and hypotension. In the ER, she was found to have hypothermia with T of 45F. She was alert, awake and conversing. Work up in the ER included hypotension with SBP in the 70's, Cr elevated to 4.42, and Hb of 8.6 g/DL (Her recent Hb was found to be 10 g/DL. Her UA showed UTI, and her recent culture showed ESBL and she had received fosphocycin at the recommendation of ID. she was admitted to step down unit. She had persistent hypotension despite IV fluids. Critical care medicine was consulted. She was treated for septic shock with IV fluids and pressors. Nephrology consulted for acute renal failure and anion gap metabolic acidosis. She has been off pressors and blood pressures are elevated. Critical care medicine has seen on 7/9 and signed off.   Assessment/Plan: 1. UTI with Sepsis and shock: Improved with IVF-Dced. Now off pressors since 7/8. Appreciate PCCM assistance- signed off 7/9. ESBL was noted on culture from previous admission. On Imipenem-complete total 7 days of antibiotics-no by mouth options to transition based on sensitivity results of 10/30/12 (ESBL Escherichia coli). Current urine cultures show Candida. Blood cultures x2 negative to date. Will discuss with ID regarding antibiotic options-? Switch to Fosfomycin. 2. Acute Pancreatitis; Etiology unclear. TG is normal. She doesn't have Gallbladder. LFT's are normal except for mildly elevated AlkP. Could be medication induced but no known offender identified. Lipase improving. 3. Acute on stage IV Chronic Renal Failure with metabolic acidosis: Only slight change in renal function. Continue IVF with sod bicarb-DC'd. No hydronephrosis  identified on CT. Monitor UO. Nephrology input appreciated. Metabolic acidosis-resolved. Creatinine slightly better than 7/8. Baseline creatinine: Probably 2.7 4. Uncontrolled hypertension: Resume amlodipine, continue hydralazine and monitor. When necessary labetalol. 5. Abdominal Distension/diarrhea/GERD: ABD films suggested ileus. Ct did not show same. Distension could be related to pancreatitis. Monitor. No antidiarrheals. C diff was negative. Now having mild diarrhea. Abdominal distention seems to have resolved. Hold Amitiza 6. History of Bipolar Disorder: Patient had some somnolence yesterday but has since resolved. Resume Xanax. Continue Lamictal, Trileptal and risperidone. 7. Hypothyroidism: TSh was 0.6. FT4 is low again. Dose of Synthroid was reduced last admission. She actually needs higher dose. Will increase dose. . Will need to be repeated as OP in 4-6 weeks.  8. Anemia: Likely due to chronic disease. Required blood transfusion during previous hospitalization. No overt bleeding. Drop in hgb is likely dilutional. Monitor. Transfuse as needed. 9. Acute respiratory distress secondary to acidosis, pleural effusions, abdominal distention and kyphosis: Improved. Critical care team has discontinued BiPAP. Titrate oxygen to maintain saturations greater than 92%. Not on home oxygen. Monitor after Xanax. 10. Hyponatremia: Resolved. 11. Anemia/thrombocytopenia/leukopenia: Thrombocytopenia resolved. WBC better. Hemoglobin stable.  GI prophylaxis: Pepcid DVT prophylaxis: Subcutaneous heparin. Nutrition: Patient on dysphagia 3 diet. Code Status: Partial Family Communication: Discussed for Ms. [name], sister. Disposition Plan: Monitor in step down unit for a few more hours and then decide regarding transfer to telemetry.   Consultants:  Nephrology  PCCM  Procedures:  Left IJ CVL 7/6 > we'll keep for additional 24 hours and then DC to peripheral IV.  Foley catheter-keep for an  additional 24 hours and then DC if okay with nephrology.  Antibiotics:  Imipenem 7/6 >  Diflucan  7/7 > 7/7   HPI/Subjective: "I feel terrible-one of my Xanax". Some dyspnea last night. "Hungry". Denies chest pain or abdominal pain. Per nursing report, on BiPAP intermittently overnight, 3 episodes of diarrhea and elevated BPs.  Objective: Filed Vitals:   11/12/12 0400 11/12/12 0500 11/12/12 0549 11/12/12 0600  BP: 194/73  185/79 202/77  Pulse: 84   81  Temp: 97.2 F (36.2 C)   97.3 F (36.3 C)  TempSrc:      Resp: 23   19  Height:      Weight:  80.6 kg (177 lb 11.1 oz)    SpO2: 98%   97%    Intake/Output Summary (Last 24 hours) at 11/12/12 0722 Last data filed at 11/12/12 0612  Gross per 24 hour  Intake   3970 ml  Output   3700 ml  Net    270 ml   Filed Weights   11/09/12 2143 11/11/12 0200 11/12/12 0500  Weight: 68.9 kg (151 lb 14.4 oz) 80.8 kg (178 lb 2.1 oz) 80.6 kg (177 lb 11.1 oz)    Exam:   General exam: Comfortable. Appears anxious  Respiratory system: Reduced breath sounds in the bases with occasional basal crackles. Rest of lung fields clear to auscultation. Mild increased work of breathing.  Cardiovascular system: S1 & S2 heard, RRR. No JVD, murmurs, gallops, clicks has trace bilateral leg edema.  Gastrointestinal system: Abdomen is nondistended, soft and nontender. Normal bowel sounds heard.  Central nervous system: Alert and oriented x3. No focal neurological deficits.  Extremities: Symmetric 5 x 5 power.   Data Reviewed: Basic Metabolic Panel:  Recent Labs Lab 11/10/12 1000 11/10/12 2000 11/11/12 0410 11/11/12 1735 11/12/12 0600  NA 131* 128* 130* 134* 141  K 5.1 4.5 3.9 4.0 3.5  CL 106 102 104 101 104  CO2 10* 9* 12* 18* 24  GLUCOSE 126* 136* 123* 88 87  BUN 71* 71* 69* 68* 67*  CREATININE 4.47* 4.32* 4.34* 4.08* 3.84*  CALCIUM 7.0* 7.1* 7.1* 7.3* 7.8*  PHOS  --   --   --  7.9* 7.7*   Liver Function Tests:  Recent Labs Lab  11/09/12 1608 11/10/12 1000 11/11/12 0410 11/11/12 1735 11/12/12 0600  AST 19 20 18   --  17  ALT 30 31 27   --  23  ALKPHOS 232* 210* 175*  --  181*  BILITOT 0.1* 0.1* 0.1*  --  0.1*  PROT 6.2 5.8* 4.9*  --  5.3*  ALBUMIN 2.8* 2.7* 2.2* 2.4* 2.3*    Recent Labs Lab 11/10/12 1000 11/11/12 0410 11/12/12 0600  LIPASE 556* 248* 247*   No results found for this basename: AMMONIA,  in the last 168 hours CBC:  Recent Labs Lab 11/09/12 1608 11/10/12 0530 11/10/12 1000 11/11/12 0410 11/12/12 0600  WBC 3.4*  --  5.9 3.3* 3.7*  HGB 8.6* 8.7* 9.1* 8.0* 8.5*  HCT 26.2* 26.8* 28.0* 24.1* 24.7*  MCV 99.2  --  97.2 96.4 93.6  PLT 150  --  177 139* 159   Cardiac Enzymes:  Recent Labs Lab 11/09/12 1608 11/10/12 0530 11/10/12 1237 11/10/12 2000  TROPONINI <0.30 <0.30 <0.30 <0.30   BNP (last 3 results) No results found for this basename: PROBNP,  in the last 8760 hours CBG:  Recent Labs Lab 11/09/12 1643  GLUCAP 157*    Recent Results (from the past 240 hour(s))  URINE CULTURE     Status: None   Collection Time    11/09/12  4:22 PM  Result Value Range Status   Specimen Description URINE, CATHETERIZED   Final   Special Requests NONE   Final   Culture  Setup Time 11/09/2012 23:14   Final   Colony Count >=100,000 COLONIES/ML   Final   Culture YEAST   Final   Report Status 11/10/2012 FINAL   Final  CULTURE, BLOOD (ROUTINE X 2)     Status: None   Collection Time    11/09/12  4:50 PM      Result Value Range Status   Specimen Description BLOOD LEFT HAND   Final   Special Requests BOTTLES DRAWN AEROBIC AND ANAEROBIC 4.5CC   Final   Culture  Setup Time 11/09/2012 21:38   Final   Culture     Final   Value:        BLOOD CULTURE RECEIVED NO GROWTH TO DATE CULTURE WILL BE HELD FOR 5 DAYS BEFORE ISSUING A FINAL NEGATIVE REPORT   Report Status PENDING   Incomplete  CULTURE, BLOOD (ROUTINE X 2)     Status: None   Collection Time    11/09/12  4:58 PM      Result Value  Range Status   Specimen Description BLOOD LEFT HAND   Final   Special Requests BOTTLES DRAWN AEROBIC AND ANAEROBIC 5CC   Final   Culture  Setup Time 11/09/2012 21:38   Final   Culture     Final   Value:        BLOOD CULTURE RECEIVED NO GROWTH TO DATE CULTURE WILL BE HELD FOR 5 DAYS BEFORE ISSUING A FINAL NEGATIVE REPORT   Report Status PENDING   Incomplete  MRSA PCR SCREENING     Status: None   Collection Time    11/09/12  8:43 PM      Result Value Range Status   MRSA by PCR NEGATIVE  NEGATIVE Final   Comment:            The GeneXpert MRSA Assay (FDA     approved for NASAL specimens     only), is one component of a     comprehensive MRSA colonization     surveillance program. It is not     intended to diagnose MRSA     infection nor to guide or     monitor treatment for     MRSA infections.  CLOSTRIDIUM DIFFICILE BY PCR     Status: None   Collection Time    11/10/12 11:57 AM      Result Value Range Status   C difficile by pcr NEGATIVE  NEGATIVE Final     Studies: Ct Abdomen Pelvis Wo Contrast  11/10/2012   *RADIOLOGY REPORT*  Clinical Data: Colonic distension, pain, history of diverticulosis  CT ABDOMEN AND PELVIS WITHOUT CONTRAST  Technique:  Multidetector CT imaging of the abdomen and pelvis was performed following the standard protocol without intravenous contrast.  Comparison: 05/17/2011  Findings: There are small to moderate bilateral pleural effusions. There is bilateral posterior lung base consolidation most consistent with passive atelectasis.  In the absence of contrast, there is limited evaluation of the solid and hollow viscera.  Allowing for this, liver and spleen appear normal. Allowing for limited evaluation in the absence of IV contrast, there does appear to be mildly increased attenuation in the retroperitoneal fat surrounding the pancreas.  The pancreas is otherwise normal.  There are no adrenal gland masses.  The gallbladder is surgically absent.  The bilateral kidneys  show cortical atrophy with multi  focal fine calcification.  This is stable in appearance when compared to the prior study.  There is bilateral perinephric inflammatory change, symmetric and chronic, likely not of acute clinical significance.  The there is minimal ascites in the abdomen and pelvis.  The bladder is decompressed by Foley catheter and therefore not evaluated.  Pelvic organs show no evidence of masses.  There is diverticulosis of the sigmoid colon.  There is also diverticulosis involving the descending colon.  Diverticulosis is mild to moderate.  There is no evidence of diverticulitis.  There are no abnormally dilated loops of bowel. Liquid stool is seen throughout the colon as it was previously.  Again, this is an abnormal but nonspecific finding that can be seen with diarrhea type illness. Oral contrast was administered, and is seen within the stomach, small bowel, and cecum but has not reached the distal colon at the time of imaging.  There are no acute musculoskeletal findings.  There is diffuse osteosclerosis which may be due to chronic renal insufficiency.  IMPRESSION:  1.  Bilateral pleural effusions with passive atelectasis 2.  Nonobstructive bowel gas pattern.  Liquid stool throughout the colon.  Distal colonic diverticulosis without evidence of diverticulitis.  3.  Evidence of chronic renal disease  4.  Questionable findings suggesting the possibility of mild acute pancreatitis.   Original Report Authenticated By: Esperanza Heir, M.D.   Dg Chest Port 1 View  11/12/2012   *RADIOLOGY REPORT*  Clinical Data: Follow-up pleural effusions.  PORTABLE CHEST - 1 VIEW  Comparison: 11/09/2012  Findings: Left central venous catheter with tip over the mid SVC region.  Cardiac enlargement with mild pulmonary vascular congestion.  No definite edema.  Small bilateral pleural effusions with minimal basilar atelectasis.  No focal consolidation.  No pneumothorax.  Calcified aorta.  IMPRESSION: Small bilateral  pleural effusions with basilar atelectasis. Cardiac enlargement with pulmonary vascular congestion.   Original Report Authenticated By: Burman Nieves, M.D.   Dg Abd Portable 2v  11/10/2012   *RADIOLOGY REPORT*  Clinical Data: Abdominal pain and distention  PORTABLE ABDOMEN - 2 VIEW  Comparison: 01/03/2012  Findings: Moderate to marked gaseous distention of the colon is identified.  No abnormal small bowel dilatation identified.  No significant air fluid levels identified on the decubitus radiograph.  There is no free air identified.  IMPRESSION:  1.  Moderate gaseous distention of the colon which may be secondary to ileus. No evidence for high-grade small bowel obstruction.   Original Report Authenticated By: Signa Kell, M.D.     Additional labs:   Scheduled Meds: . acetaminophen      . famotidine  20 mg Oral BID  . heparin  5,000 Units Subcutaneous Q8H  . hydrALAZINE  25 mg Oral Q8H  . imipenem-cilastatin  250 mg Intravenous Q12H  . labetalol      . lamoTRIgine  200 mg Oral BID  . levothyroxine  50 mcg Oral QAC breakfast  . omega-3 acid ethyl esters  4 g Oral Daily  . Oxcarbazepine  900 mg Oral BID  . risperiDONE  4 mg Oral BID  . sodium bicarbonate  650 mg Oral TID  . sodium chloride  3 mL Intravenous Q12H   Continuous Infusions: . sodium chloride 20 mL/hr (11/10/12 1012)  . norepinephrine (LEVOPHED) Adult infusion 5 mcg/min (11/10/12 1200)  .  sodium bicarbonate  infusion 1000 mL 20 mL/hr at 11/12/12 4540    Principal Problem:   UTI (urinary tract infection) Active Problems:   HYPOTHYROIDISM  BPLR I, DPRSD, MOST RECENT EPSD, MANIC, SVR   ANXIETY   CONSTIPATION, CHRONIC   RENAL INSUFFICIENCY, CHRONIC   Hypothermia due to non-environmental cause   ESBL (extended spectrum beta-lactamase) producing bacteria infection   Septic shock   Acute on chronic renal failure   Pancreatitis, acute   Metabolic acidosis    Time spent: 45 minutes    [facility]  [facility] Pager [phone].   If 8PM-8AM, please contact night-coverage at [email or web address], password [facility] 11/12/2012, 7:22 AM  LOS: 3 days             \

## 2012-11-13 DIAGNOSIS — I10 Essential (primary) hypertension: Secondary | ICD-10-CM

## 2012-11-13 LAB — CBC
HCT: 25.6 % — ABNORMAL LOW (ref 36.0–46.0)
MCHC: 33.2 g/dL (ref 30.0–36.0)
MCV: 94.8 fL (ref 78.0–100.0)
Platelets: 167 10*3/uL (ref 150–400)
RDW: 17.9 % — ABNORMAL HIGH (ref 11.5–15.5)
WBC: 4.6 10*3/uL (ref 4.0–10.5)

## 2012-11-13 LAB — RENAL FUNCTION PANEL
Albumin: 2.4 g/dL — ABNORMAL LOW (ref 3.5–5.2)
BUN: 63 mg/dL — ABNORMAL HIGH (ref 6–23)
Calcium: 8.4 mg/dL (ref 8.4–10.5)
Chloride: 104 mEq/L (ref 96–112)
Creatinine, Ser: 3.57 mg/dL — ABNORMAL HIGH (ref 0.50–1.10)
GFR calc non Af Amer: 12 mL/min — ABNORMAL LOW (ref >90)
Phosphorus: 7.4 mg/dL — ABNORMAL HIGH (ref 2.3–4.6)

## 2012-11-13 MED ORDER — LABETALOL HCL 200 MG PO TABS
200.0000 mg | ORAL_TABLET | Freq: Two times a day (BID) | ORAL | Status: DC
  Administered 2012-11-13 – 2012-11-14 (×4): 200 mg via ORAL
  Filled 2012-11-13 (×6): qty 1

## 2012-11-13 MED ORDER — FUROSEMIDE 10 MG/ML IJ SOLN
80.0000 mg | Freq: Once | INTRAMUSCULAR | Status: AC
  Administered 2012-11-13: 80 mg via INTRAVENOUS
  Filled 2012-11-13: qty 8

## 2012-11-13 MED ORDER — SODIUM BICARBONATE 650 MG PO TABS
650.0000 mg | ORAL_TABLET | Freq: Every day | ORAL | Status: DC
  Administered 2012-11-14: 650 mg via ORAL
  Filled 2012-11-13: qty 1

## 2012-11-13 NOTE — Progress Notes (Signed)
[facility] PROGRESS NOTE  [name] YQM:[id number] DOB: [birth year] DOA: 11/09/2012 PCP: Pamelia Hoit, MD  Brief narrative 67 y.o. female with hx of constipation, HTN, Bipolar disorder, unsteady gait-ambulates with walker, fallen recently, presented to the ER from assisted living with altered mental status and hypotension. In the ER, she was found to have hypothermia with T of 16F. She was alert, awake and conversing. Work up in the ER included hypotension with SBP in the 70's, Cr elevated to 4.42, and Hb of 8.6 g/DL (Her recent Hb was found to be 10 g/DL. Her UA showed UTI, and her recent culture showed ESBL and she had received fosphocycin at the recommendation of ID. she was admitted to step down unit. She had persistent hypotension despite IV fluids. Critical care medicine was consulted. She was treated for septic shock with IV fluids and pressors. Nephrology consulted for acute renal failure and anion gap metabolic acidosis. She has been off pressors and blood pressures are elevated. Critical care medicine has seen on 7/9 and signed off.   Assessment/Plan: 1. UTI with Sepsis and shock: Improved with IVF-Dced. Now off pressors since 7/8. Appreciate PCCM assistance- signed off 7/9. ESBL was noted on culture from previous admission. On Imipenem-complete total 7 days of antibiotics-no by mouth options to transition based on sensitivity results of 10/30/12 (ESBL Escherichia coli). Current urine cultures show Candida. Blood cultures x2 negative to date. Will discuss with ID regarding antibiotic options-? Switch to Fosfomycin on DC. 2. Acute Pancreatitis; Etiology unclear. TG is normal. She doesn't have Gallbladder. LFT's are normal except for mildly elevated AlkP. Could be medication induced but no known offender identified. Lipase improving. Asymptomatic currently. 3. Acute on stage IV Chronic Renal Failure with metabolic acidosis: IVF with sod bicarb-DC'd. No hydronephrosis identified on  CT. Monitor UO- 8L after Lasix on 7/9. Nephrology input appreciated. Metabolic acidosis-resolved. Creatinine improving. Baseline creatinine: Probably 2.7. Discussed with nephrology-will provide additional dose of Lasix and continue Foley catheter for additional 24 hours. 4. Uncontrolled hypertension: Resume amlodipine, continue hydralazine and added oral labetalol. Monitor. 5. Abdominal Distension/diarrhea/GERD: ABD films suggested ileus. Ct did not show same. Distension could be related to pancreatitis. Monitor. No antidiarrheals. C diff was negative. Now having mild diarrhea-decreasing. Abdominal distention seems to have resolved. Hold Amitiza 6. History of Bipolar Disorder: Resumed Xanax. Continue Lamictal, Trileptal and risperidone. Stable. 7. Hypothyroidism: TSh was 0.6. FT4 is low again. Dose of Synthroid was reduced last admission. She actually needs higher dose. Will increase dose. . Will need to be repeated as OP in 4-6 weeks.  8. Acute respiratory distress secondary to acidosis, pleural effusions, abdominal distention and kyphosis: Improved. Critical care team has discontinued BiPAP. Titrate oxygen to maintain saturations greater than 92%. Not on home oxygen. Monitor after Xanax. Improved after diuresis. 9. Hyponatremia: Resolved. 10. Anemia/thrombocytopenia/leukopenia: Thrombocytopenia resolved. WBC normalized. Hemoglobin stable. Anemia secondary to chronic kidney disease. Transfuse if hemoglobin less than 7 g per DL.  GI prophylaxis: Pepcid DVT prophylaxis: Subcutaneous heparin. Nutrition: Patient on dysphagia 3 diet. Code Status: Partial Family Communication: Discussed for [name], sister on 11/12/12. Disposition Plan: transfer to telemetry.   Consultants:  Nephrology  PCCM  Procedures:  Left IJ CVL 7/6 > 7/10  Foley catheter-keep for an additional 24 hours and then DC if okay with nephrology.  Antibiotics:  Imipenem 7/6 >  Diflucan 7/7 > 7/7    HPI/Subjective: Patient states that she feels much better than yesterday. Denies complaints. Denies dyspnea. 2 small loose BMs yesterday  but none this morning. Per nursing, no acute events.  Objective: Filed Vitals:   11/13/12 0800 11/13/12 1000 11/13/12 1231 11/13/12 1438  BP: 180/71 177/73 170/69 171/78  Pulse: 97 96 76 84  Temp: 98.6 F (37 C)  98.8 F (37.1 C) 98.4 F (36.9 C)  TempSrc:   Oral Oral  Resp: 26 23 20 20   Height:      Weight:   76.023 kg (167 lb 9.6 oz)   SpO2: 92% 91% 92% 94%    Intake/Output Summary (Last 24 hours) at 11/13/12 1446 Last data filed at 11/13/12 1200  Gross per 24 hour  Intake    640 ml  Output   7300 ml  Net  -6660 ml   Filed Weights   11/11/12 0200 11/12/12 0500 11/13/12 1231  Weight: 80.8 kg (178 lb 2.1 oz) 80.6 kg (177 lb 11.1 oz) 76.023 kg (167 lb 9.6 oz)    Exam:   General exam: Comfortable. Looks much better than yesterday.  Respiratory system: Reduced breath sounds in the bases with occasional basal crackles. Rest of lung fields clear to auscultation. No increased work of breathing.  Cardiovascular system: S1 & S2 heard, RRR. No JVD, murmurs, gallops, clicks has trace bilateral leg edema. Telemetry: Sinus rhythm with first degree AV block  Gastrointestinal system: Abdomen is nondistended, soft and nontender. Normal bowel sounds heard.  Central nervous system: Alert and oriented x3. No focal neurological deficits.  Extremities: Symmetric 5 x 5 power.  Musculoskeletal system: Significant kyphosis   Data Reviewed: Basic Metabolic Panel:  Recent Labs Lab 11/10/12 2000 11/11/12 0410 11/11/12 1735 11/12/12 0600 11/13/12 0509  NA 128* 130* 134* 141 142  K 4.5 3.9 4.0 3.5 4.4  CL 102 104 101 104 104  CO2 9* 12* 18* 24 26  GLUCOSE 136* 123* 88 87 98  BUN 71* 69* 68* 67* 63*  CREATININE 4.32* 4.34* 4.08* 3.84* 3.57*  CALCIUM 7.1* 7.1* 7.3* 7.8* 8.4  PHOS  --   --  7.9* 7.7* 7.4*   Liver Function Tests:  Recent  Labs Lab 11/09/12 1608 11/10/12 1000 11/11/12 0410 11/11/12 1735 11/12/12 0600 11/13/12 0509  AST 19 20 18   --  17  --   ALT 30 31 27   --  23  --   ALKPHOS 232* 210* 175*  --  181*  --   BILITOT 0.1* 0.1* 0.1*  --  0.1*  --   PROT 6.2 5.8* 4.9*  --  5.3*  --   ALBUMIN 2.8* 2.7* 2.2* 2.4* 2.3* 2.4*    Recent Labs Lab 11/10/12 1000 11/11/12 0410 11/12/12 0600  LIPASE 556* 248* 247*   No results found for this basename: AMMONIA,  in the last 168 hours CBC:  Recent Labs Lab 11/09/12 1608 11/10/12 0530 11/10/12 1000 11/11/12 0410 11/12/12 0600 11/13/12 0509  WBC 3.4*  --  5.9 3.3* 3.7* 4.6  HGB 8.6* 8.7* 9.1* 8.0* 8.5* 8.5*  HCT 26.2* 26.8* 28.0* 24.1* 24.7* 25.6*  MCV 99.2  --  97.2 96.4 93.6 94.8  PLT 150  --  177 139* 159 167   Cardiac Enzymes:  Recent Labs Lab 11/09/12 1608 11/10/12 0530 11/10/12 1237 11/10/12 2000  TROPONINI <0.30 <0.30 <0.30 <0.30   BNP (last 3 results) No results found for this basename: PROBNP,  in the last 8760 hours CBG:  Recent Labs Lab 11/09/12 1643  GLUCAP 157*    Recent Results (from the past 240 hour(s))  URINE CULTURE  Status: None   Collection Time    11/09/12  4:22 PM      Result Value Range Status   Specimen Description URINE, CATHETERIZED   Final   Special Requests NONE   Final   Culture  Setup Time 11/09/2012 23:14   Final   Colony Count >=100,000 COLONIES/ML   Final   Culture YEAST   Final   Report Status 11/10/2012 FINAL   Final  CULTURE, BLOOD (ROUTINE X 2)     Status: None   Collection Time    11/09/12  4:50 PM      Result Value Range Status   Specimen Description BLOOD LEFT HAND   Final   Special Requests BOTTLES DRAWN AEROBIC AND ANAEROBIC 4.5CC   Final   Culture  Setup Time 11/09/2012 21:38   Final   Culture     Final   Value:        BLOOD CULTURE RECEIVED NO GROWTH TO DATE CULTURE WILL BE HELD FOR 5 DAYS BEFORE ISSUING A FINAL NEGATIVE REPORT   Report Status PENDING   Incomplete  CULTURE,  BLOOD (ROUTINE X 2)     Status: None   Collection Time    11/09/12  4:58 PM      Result Value Range Status   Specimen Description BLOOD LEFT HAND   Final   Special Requests BOTTLES DRAWN AEROBIC AND ANAEROBIC 5CC   Final   Culture  Setup Time 11/09/2012 21:38   Final   Culture     Final   Value:        BLOOD CULTURE RECEIVED NO GROWTH TO DATE CULTURE WILL BE HELD FOR 5 DAYS BEFORE ISSUING A FINAL NEGATIVE REPORT   Report Status PENDING   Incomplete  MRSA PCR SCREENING     Status: None   Collection Time    11/09/12  8:43 PM      Result Value Range Status   MRSA by PCR NEGATIVE  NEGATIVE Final   Comment:            The GeneXpert MRSA Assay (FDA     approved for NASAL specimens     only), is one component of a     comprehensive MRSA colonization     surveillance program. It is not     intended to diagnose MRSA     infection nor to guide or     monitor treatment for     MRSA infections.  CLOSTRIDIUM DIFFICILE BY PCR     Status: None   Collection Time    11/10/12 11:57 AM      Result Value Range Status   C difficile by pcr NEGATIVE  NEGATIVE Final     Studies: Dg Chest Port 1 View  11/12/2012   *RADIOLOGY REPORT*  Clinical Data: Follow-up pleural effusions.  PORTABLE CHEST - 1 VIEW  Comparison: 11/09/2012  Findings: Left central venous catheter with tip over the mid SVC region.  Cardiac enlargement with mild pulmonary vascular congestion.  No definite edema.  Small bilateral pleural effusions with minimal basilar atelectasis.  No focal consolidation.  No pneumothorax.  Calcified aorta.  IMPRESSION: Small bilateral pleural effusions with basilar atelectasis. Cardiac enlargement with pulmonary vascular congestion.   Original Report Authenticated By: Burman Nieves, M.D.     Additional labs:   Scheduled Meds: . ALPRAZolam  1 mg Oral Q8H  . amLODipine  10 mg Oral Daily  . famotidine  20 mg Oral Daily  . heparin  5,000 Units  Subcutaneous Q8H  . hydrALAZINE  25 mg Oral Q8H  .  imipenem-cilastatin  250 mg Intravenous Q12H  . labetalol  200 mg Oral BID  . lamoTRIgine  200 mg Oral BID  . levothyroxine  50 mcg Oral QAC breakfast  . omega-3 acid ethyl esters  4 g Oral Daily  . Oxcarbazepine  900 mg Oral BID  . risperiDONE  4 mg Oral BID  . [START ON 11/14/2012] sodium bicarbonate  650 mg Oral Daily  . sodium chloride  3 mL Intravenous Q12H   Continuous Infusions:    Principal Problem:   UTI (urinary tract infection) Active Problems:   HYPOTHYROIDISM   BPLR I, DPRSD, MOST RECENT EPSD, MANIC, SVR   ANXIETY   CONSTIPATION, CHRONIC   RENAL INSUFFICIENCY, CHRONIC   Hypothermia due to non-environmental cause   ESBL (extended spectrum beta-lactamase) producing bacteria infection   Septic shock   Acute on chronic renal failure   Pancreatitis, acute   Metabolic acidosis    Time spent: 35 minutes    [facility]  [facility] Pager [phone].   If 8PM-8AM, please contact night-coverage at [email or web address], password [facility] 11/13/2012, 2:46 PM  LOS: 4 days             \

## 2012-11-13 NOTE — Progress Notes (Signed)
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  2 UTI E.Coli (esbl) & Funguria  3 Acidosis (likely worse due to tubulointerstitial disease)  4 Hypertension  Plan:  1 Diurese  2 ESRD education, but hope for return of renal function to baseline  Subjective: Interval History:Breathing better  Objective: Vital signs in last 24 hours: Temp:  [96.6 F (35.9 C)-98.6 F (37 C)] 98.6 F (37 C) (07/10 0800) Pulse Rate:  [78-98] 96 (07/10 1000) Resp:  [18-29] 23 (07/10 1000) BP: (121-205)/(48-129) 177/73 mmHg (07/10 1000) SpO2:  [91 %-98 %] 91 % (07/10 1000) Weight change:   Intake/Output from previous day: 07/09 0701 - 07/10 0700 In: 700 [I.V.:500; IV Piggyback:200] Out: 8400 [Urine:8400] Intake/Output this shift: Total I/O In: 80 [I.V.:80] Out: -   General appearance: alert and cooperative Head: Normocephalic, without obvious abnormality, atraumatic Resp: rales bilaterally GI: soft, non-tender; bowel sounds normal; no masses,  no organomegaly Extremities: edema 1-2+  Lab Results:  Recent Labs  11/12/12 0600 11/13/12 0509  WBC 3.7* 4.6  HGB 8.5* 8.5*  HCT 24.7* 25.6*  PLT 159 167   BMET:  Recent Labs  11/12/12 0600 11/13/12 0509  NA 141 142  K 3.5 4.4  CL 104 104  CO2 24 26  GLUCOSE 87 98  BUN 67* 63*  CREATININE 3.84* 3.57*  CALCIUM 7.8* 8.4   No results found for this basename: PTH,  in the last 72 hours Iron Studies: No results found for this basename: IRON, TIBC, TRANSFERRIN, FERRITIN,  in the last 72 hours Studies/Results: Dg Chest Port 1 View  11/12/2012   *RADIOLOGY REPORT*  Clinical Data: Follow-up pleural effusions.  PORTABLE CHEST - 1 VIEW  Comparison: 11/09/2012  Findings: Left central venous catheter with tip over the mid SVC region.  Cardiac enlargement with mild pulmonary vascular congestion.  No definite edema.  Small bilateral  pleural effusions with minimal basilar atelectasis.  No focal consolidation.  No pneumothorax.  Calcified aorta.  IMPRESSION: Small bilateral pleural effusions with basilar atelectasis. Cardiac enlargement with pulmonary vascular congestion.   Original Report Authenticated By: Burman Nieves, M.D.   Scheduled: . ALPRAZolam  1 mg Oral Q8H  . amLODipine  10 mg Oral Daily  . famotidine  20 mg Oral Daily  . heparin  5,000 Units Subcutaneous Q8H  . hydrALAZINE  25 mg Oral Q8H  . imipenem-cilastatin  250 mg Intravenous Q12H  . labetalol  200 mg Oral BID  . lamoTRIgine  200 mg Oral BID  . levothyroxine  50 mcg Oral QAC breakfast  . omega-3 acid ethyl esters  4 g Oral Daily  . Oxcarbazepine  900 mg Oral BID  . risperiDONE  4 mg Oral BID  . sodium bicarbonate  650 mg Oral TID  . sodium chloride  10-40 mL Intracatheter Q12H  . sodium chloride  3 mL Intravenous Q12H     LOS: 4 days   Lott Seelbach C 11/13/2012,12:08 PM

## 2012-11-13 NOTE — Progress Notes (Signed)
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; acute exacerbation improving 2 UTI E.Coli (esbl) & Funguria  3 Acidosis (likely worse due to tubulointerstitial disease)  4 Hypertension  5 Volume Overload, improving Plan:  1 Diurese a little more  Subjective: Interval History: Diuresed >8000cc  Objective: Vital signs in last 24 hours: Temp:  [96.6 F (35.9 C)-98.8 F (37.1 C)] 98.8 F (37.1 C) (07/10 1231) Pulse Rate:  [76-98] 76 (07/10 1231) Resp:  [18-29] 20 (07/10 1231) BP: (121-205)/(48-129) 170/69 mmHg (07/10 1231) SpO2:  [91 %-98 %] 92 % (07/10 1231) Weight:  [76.023 kg (167 lb 9.6 oz)] 76.023 kg (167 lb 9.6 oz) (07/10 1231) Weight change:   Intake/Output from previous day: 07/09 0701 - 07/10 0700 In: 700 [I.V.:500; IV Piggyback:200] Out: 8400 [Urine:8400] Intake/Output this shift: Total I/O In: 80 [I.V.:80] Out: 1000 [Urine:1000]  General appearance: alert and cooperative Resp: diminished breath sounds bilaterally Cardio: regular rate and rhythm, S1, S2 normal, no murmur, click, rub or gallop GI: soft, non-tender; bowel sounds normal; no masses,  no organomegaly Extremities: edema 1+  Lab Results:  Recent Labs  11/12/12 0600 11/13/12 0509  WBC 3.7* 4.6  HGB 8.5* 8.5*  HCT 24.7* 25.6*  PLT 159 167   BMET:  Recent Labs  11/12/12 0600 11/13/12 0509  NA 141 142  K 3.5 4.4  CL 104 104  CO2 24 26  GLUCOSE 87 98  BUN 67* 63*  CREATININE 3.84* 3.57*  CALCIUM 7.8* 8.4   No results found for this basename: PTH,  in the last 72 hours Iron Studies: No results found for this basename: IRON, TIBC, TRANSFERRIN, FERRITIN,  in the last 72 hours Studies/Results: Dg Chest Port 1 View  11/12/2012   *RADIOLOGY REPORT*  Clinical Data: Follow-up pleural effusions.  PORTABLE CHEST - 1 VIEW  Comparison: 11/09/2012  Findings: Left central venous  catheter with tip over the mid SVC region.  Cardiac enlargement with mild pulmonary vascular congestion.  No definite edema.  Small bilateral pleural effusions with minimal basilar atelectasis.  No focal consolidation.  No pneumothorax.  Calcified aorta.  IMPRESSION: Small bilateral pleural effusions with basilar atelectasis. Cardiac enlargement with pulmonary vascular congestion.   Original Report Authenticated By: Burman Nieves, M.D.   Scheduled: . ALPRAZolam  1 mg Oral Q8H  . amLODipine  10 mg Oral Daily  . famotidine  20 mg Oral Daily  . heparin  5,000 Units Subcutaneous Q8H  . hydrALAZINE  25 mg Oral Q8H  . imipenem-cilastatin  250 mg Intravenous Q12H  . labetalol  200 mg Oral BID  . lamoTRIgine  200 mg Oral BID  . levothyroxine  50 mcg Oral QAC breakfast  . omega-3 acid ethyl esters  4 g Oral Daily  . Oxcarbazepine  900 mg Oral BID  . risperiDONE  4 mg Oral BID  . sodium bicarbonate  650 mg Oral TID  . sodium chloride  10-40 mL Intracatheter Q12H  . sodium chloride  3 mL Intravenous Q12H    LOS: 4 days   [name] C 11/13/2012,12:40 PM

## 2012-11-13 NOTE — Progress Notes (Signed)
Occupational Therapy Evaluation Patient Details Name: [name] MRN: [id number] DOB: 1945/07/30 Today's Date: 11/13/2012 Time: 9562-1308 OT Time Calculation (min): 20 min  OT Assessment / Plan / Recommendation History of present illness [name] is an 67 y.o. female with hx of constipation, HTN, Bipolar disorder, unsteady gait, fallen recently, presents to the ER from assisted living as she was having altered mental status and hypotension. She had hospital admission last month after a fall   Clinical Impression   This 67 year old female was admitted for uti.  She was admitted last month after a fall.  Pt is from [facility].  She was mod I with adls.  Pt will benefit from skilled OT to increase safety and independence with adls.  Goals in acute are set up to min A level.      OT Assessment  Patient needs continued OT Services    Follow Up Recommendations  SNF    Barriers to Discharge      Equipment Recommendations  3 in 1 bedside comode    Recommendations for Other Services    Frequency  Min 2X/week    Precautions / Restrictions Precautions Precautions: Fall Restrictions Weight Bearing Restrictions: No   Pertinent Vitals/Pain No pain    ADL  Eating/Feeding: Set up Where Assessed - Eating/Feeding: Bed level. Pt reports h/o swallowing problems and doesn't use straws Grooming: Set up Where Assessed - Grooming: Supported sitting Upper Body Bathing: Minimal assistance Where Assessed - Upper Body Bathing: Supported sitting Lower Body Bathing: Moderate assistance Where Assessed - Lower Body Bathing: Supported sit to stand Upper Body Dressing: Minimal assistance Where Assessed - Upper Body Dressing: Supported sitting Lower Body Dressing: Maximal assistance Where Assessed - Lower Body Dressing: Supported sit to Pharmacist, hospital: Simulated;Moderate assistance (see transfers below) Toilet Transfer Method: Stand pivot (chair to bed) Toileting -  Clothing Manipulation and Hygiene: Moderate assistance Where Assessed - Toileting Clothing Manipulation and Hygiene: Sit to stand from 3-in-1 or toilet Equipment Used: Rolling walker Transfers/Ambulation Related to ADLs: When performing transfer to bed, pt had her slippers on and feet started sliding out in front of her.  Mod A required to complete turn and safely get to eob ADL Comments: Pt very sleepy when I first arrived.  Pt has more strength in RUE than L.  She states that she was mod I with adls:  deconditioned at this time.      OT Diagnosis: Generalized weakness  OT Problem List: Decreased strength;Decreased activity tolerance;Decreased safety awareness;Impaired balance (sitting and/or standing);Decreased knowledge of use of DME or AE OT Treatment Interventions: Self-care/ADL training;DME and/or AE instruction;Therapeutic activities;Patient/family education;Other (comment)   OT Goals(Current goals can be found in the care plan section) Acute Rehab OT Goals Patient Stated Goal: pt agrees she will need short term rehab prior to returning to ALF OT Goal Formulation: With patient Time For Goal Achievement: 11/27/12 Potential to Achieve Goals: Good ADL Goals Pt Will Perform Upper Body Bathing: with set-up;sitting Pt Will Perform Lower Body Bathing: with supervision;with adaptive equipment;sit to/from stand Pt Will Perform Upper Body Dressing: with set-up;sitting Pt Will Perform Lower Body Dressing: with supervision;with adaptive equipment;sit to/from stand Pt Will Transfer to Toilet: with min guard assist;stand pivot transfer;bedside commode Pt Will Perform Toileting - Clothing Manipulation and hygiene: with min guard assist;sit to/from stand  Visit Information  Last OT Received On: 11/13/12 Assistance Needed: +2 (for safety) History of Present Illness: [name] is an 67 y.o. female with  hx of constipation, HTN, Bipolar disorder, unsteady gait, fallen recently, presents to the ER  from assisted living as she was having altered mental status and hypotension. She had hospital admission last month after a fall       Prior Functioning     Home Living Family/patient expects to be discharged to:: Skilled nursing facility Home Equipment: Walker - 4 wheels Additional Comments: pt reports she had been at [facility] in the past for rehab and would like to return there. 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   Communication Communication:  (mumbles)         Vision/Perception     Cognition  Cognition Arousal/Alertness: Awake/alert Behavior During Therapy: WFL for tasks assessed/performed Overall Cognitive Status: History of cognitive impairments - at baseline Memory: Decreased short-term memory    Extremity/Trunk Assessment Upper Extremity Assessment Upper Extremity Assessment: RUE deficits/detail;LUE deficits/detail RUE Deficits / Details: able to lift to 90 degrees; strength grossly 3/5 LUE Deficits / Details: LUE, AROM approximately 45; AAROM approximately 80.  Shoulder grossly 3-/5; distallly 3/5 Cervical / Trunk Assessment Cervical / Trunk Assessment: Other exceptions;Kyphotic Cervical / Trunk Exceptions: pt very kyphotic with head forward flexed and contracted.       Mobility  Sit to Supine: 3: Mod assist;With rail;HOB flat Details for Bed Mobility Assistance: pt having diffuculty moving on hospital bed Transfers Sit to Stand: 4: Min assist;With upper extremity assist;From chair/3-in-1;With armrests Stand to Sit: 3: Mod assist;To bed (due to feet sliding out) Details for Transfer Assistance: min trunk assistance and steadying.      Exercise    Balance Static Sitting Balance Static Sitting - Balance Support: Bilateral upper extremity supported;Feet supported Static Sitting -  Level of Assistance: 5: Stand by assistance Static Standing Balance Static Standing - Balance Support: Bilateral upper extremity supported Static Standing - Level of Assistance: 5: Stand by assistance   End of Session OT - End of Session Activity Tolerance: Patient tolerated treatment well Patient left: in bed;with call bell/phone within reach  GO     [facility] 11/13/2012, 4:34 PM Marica Otter, OTR/L [phone] 11/13/2012

## 2012-11-13 NOTE — Evaluation (Signed)
Physical Therapy Evaluation Patient Details Name: ISLEY WEISHEIT MRN: [id number] DOB: 02-06-1946 Today's Date: 11/13/2012 Time: 9528-4132 PT Time Calculation (min): 26 min  PT Assessment / Plan / Recommendation History of Present Illness  AUDRIONNA LAMPTON is an 67 y.o. female with hx of constipation, HTN, Bipolar disorder, unsteady gait, fallen recently, presents to the ER from assisted living as she was having altered mental status and hypotension. She had hospital admission last month after a fall  Clinical Impression  Pt appears deconditioned and has marked kyphosis and forward head. She has dyspnea with some coughing with exertion.  She will benefit from PT 5x/ wk to regain functional strength, mobility and safety to eventually return to ALF level.  Recommend short term SNF    PT Assessment  Patient needs continued PT services    Follow Up Recommendations  SNF    Does the patient have the potential to tolerate intense rehabilitation      Barriers to Discharge        Equipment Recommendations  None recommended by PT    Recommendations for Other Services     Frequency Min 3X/week    Precautions / Restrictions Precautions Precautions: Sternal   Pertinent Vitals/Pain No c/o pain      Mobility  Bed Mobility Supine to Sit: 5: Supervision;HOB flat;3: Mod assist Sitting - Scoot to Edge of Bed: 4: Min assist Details for Bed Mobility Assistance: pt having diffuculty moving on hospital bed Transfers Transfers: Sit to Stand;Stand to Sit Sit to Stand: With upper extremity assist;From bed;3: Mod assist;From chair/3-in-1 Stand to Sit: To chair/3-in-1;With armrests;5: Supervision;4: Min assist Details for Transfer Assistance: more difficulty standing from a low surface.  Pt did better standing from a higher, firmer chair, but was not able to tolerate standing long Ambulation/Gait Ambulation/Gait Assistance: 4: Min assist Ambulation Distance (Feet): 3 Feet Assistive device: Rolling  walker Ambulation/Gait Assistance Details: encourage pt to hold her head up and activate trunk extensors Gait Pattern: Step-through pattern;Shuffle;Trunk flexed Gait velocity: decreased General Gait Details: pt has severe forward head and kyphosis posture which impairs her gait and balance Stairs: No Wheelchair Mobility Wheelchair Mobility: No    Exercises General Exercises - Lower Extremity Ankle Circles/Pumps: AROM;Both;5 reps;Supine Long Arc Quad: AROM;Both;5 reps;Seated Hip Flexion/Marching: AROM;Both;5 reps;Seated   PT Diagnosis: Abnormality of gait;Difficulty walking;Generalized weakness  PT Problem List: Decreased strength;Decreased range of motion;Decreased activity tolerance;Decreased mobility PT Treatment Interventions: DME instruction;Gait training;Functional mobility training;Therapeutic activities;Therapeutic exercise;Balance training     PT Goals(Current goals can be found in the care plan section) Acute Rehab PT Goals Patient Stated Goal: pt agrees she will need short term rehab prior to returning to ALF PT Goal Formulation: With patient Time For Goal Achievement: 11/27/12 Potential to Achieve Goals: Good  Visit Information  Last PT Received On: 11/13/12 Assistance Needed: +2 History of Present Illness: GIULIANNA ROCHA is an 67 y.o. female with hx of constipation, HTN, Bipolar disorder, unsteady gait, fallen recently, presents to the ER from assisted living as she was having altered mental status and hypotension. She had hospital admission last month after a fall       Prior Functioning  Home Living Family/patient expects to be discharged to:: Skilled nursing facility Home Equipment: Walker - 4 wheels Additional Comments: pt reports she had been at [facility] in the past for rehab and would like to return there. 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   Communication Communication: Other (comment) (pt tends to mumble)    Cognition  Cognition Arousal/Alertness: Awake/alert Behavior During Therapy: WFL for tasks assessed/performed Overall Cognitive Status: History of cognitive impairments - at baseline Memory: Decreased short-term memory    Extremity/Trunk Assessment Cervical / Trunk Assessment Cervical / Trunk Assessment: Other exceptions;Kyphotic Cervical / Trunk Exceptions: pt very kyphotic with head forward flexed and contracted.     Balance Static Sitting Balance Static Sitting - Balance Support: Bilateral upper extremity supported;Feet supported Static Sitting - Level of Assistance: 5: Stand by assistance Static Standing Balance Static Standing - Balance Support: Bilateral upper extremity supported Static Standing - Level of Assistance: 5: Stand by assistance  End of Session PT - End of Session Activity Tolerance: Patient limited by fatigue Patient left: in chair Nurse Communication: Mobility status  GP    Bayard Hugger. Manson Passey, PT 11/13/2012, 2:02 PM

## 2012-11-14 ENCOUNTER — Inpatient Hospital Stay (HOSPITAL_COMMUNITY): Payer: Medicare Other

## 2012-11-14 DIAGNOSIS — E039 Hypothyroidism, unspecified: Secondary | ICD-10-CM

## 2012-11-14 DIAGNOSIS — I359 Nonrheumatic aortic valve disorder, unspecified: Secondary | ICD-10-CM

## 2012-11-14 LAB — RENAL FUNCTION PANEL
Albumin: 2.5 g/dL — ABNORMAL LOW (ref 3.5–5.2)
BUN: 66 mg/dL — ABNORMAL HIGH (ref 6–23)
Calcium: 9.1 mg/dL (ref 8.4–10.5)
Creatinine, Ser: 3.62 mg/dL — ABNORMAL HIGH (ref 0.50–1.10)
Glucose, Bld: 98 mg/dL (ref 70–99)
Phosphorus: 7.7 mg/dL — ABNORMAL HIGH (ref 2.3–4.6)

## 2012-11-14 LAB — CBC
HCT: 27.8 % — ABNORMAL LOW (ref 36.0–46.0)
MCH: 31.6 pg (ref 26.0–34.0)
MCHC: 33.1 g/dL (ref 30.0–36.0)
MCV: 95.5 fL (ref 78.0–100.0)
Platelets: 167 10*3/uL (ref 150–400)
RDW: 17.8 % — ABNORMAL HIGH (ref 11.5–15.5)
WBC: 5.8 10*3/uL (ref 4.0–10.5)

## 2012-11-14 MED ORDER — FUROSEMIDE 10 MG/ML IJ SOLN
80.0000 mg | Freq: Once | INTRAMUSCULAR | Status: AC
  Administered 2012-11-14: 80 mg via INTRAVENOUS
  Filled 2012-11-14: qty 8

## 2012-11-14 MED ORDER — SEVELAMER CARBONATE 800 MG PO TABS
800.0000 mg | ORAL_TABLET | Freq: Three times a day (TID) | ORAL | Status: DC
  Administered 2012-11-14 – 2012-11-17 (×9): 800 mg via ORAL
  Filled 2012-11-14 (×11): qty 1

## 2012-11-14 MED ORDER — CALCITRIOL 0.5 MCG PO CAPS
0.5000 ug | ORAL_CAPSULE | Freq: Every day | ORAL | Status: DC
  Administered 2012-11-14 – 2012-11-17 (×4): 0.5 ug via ORAL
  Filled 2012-11-14 (×4): qty 1

## 2012-11-14 NOTE — Progress Notes (Signed)
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O.:600; I.V.:80; IV Piggyback:100] Out: 3102 [Urine:3102] Intake/Output this shift: Total I/O In: 940 [P.O.:840; IV Piggyback:100] Out: 1001 [Urine:1000; Stool:1]  General appearance: alert and cooperative Back: severe kyphosis Cardio: regular rate and rhythm and systolic murmur: systolic ejection 2/6, crescendo and decrescendo at lower left sternal border, ? diastolic murmur Extremities: edema 1-2+  Lab Results:  Recent Labs  11/13/12 0509 11/14/12 0445  WBC 4.6 5.8  HGB 8.5* 9.2*  HCT 25.6* 27.8*  PLT 167 167   BMET:  Recent Labs  11/13/12 0509 11/14/12 0445  NA 142 143  K 4.4 4.0  CL 104 105  CO2 26 26  GLUCOSE 98 98  BUN 63* 66*  CREATININE 3.57* 3.62*  CALCIUM 8.4 9.1  phos7.7 today  Recent Labs  11/12/12 0600  PTH 1469.2*   Iron Studies: No results found for this basename: IRON, TIBC, TRANSFERRIN, FERRITIN,  in the last 72 hours Studies/Results: No results found.  Scheduled: . ALPRAZolam  1 mg Oral Q8H  . amLODipine  10 mg Oral Daily  . famotidine  20 mg Oral Daily  . heparin  5,000 Units Subcutaneous Q8H  . hydrALAZINE  25 mg Oral Q8H  . imipenem-cilastatin  250 mg Intravenous  Q12H  . labetalol  200 mg Oral BID  . lamoTRIgine  200 mg Oral BID  . levothyroxine  50 mcg Oral QAC breakfast  . omega-3 acid ethyl esters  4 g Oral Daily  . Oxcarbazepine  900 mg Oral BID  . risperiDONE  4 mg Oral BID  . sodium bicarbonate  650 mg Oral Daily  . sodium chloride  3 mL Intravenous Q12H    LOS: 5 days   [name] C 11/14/2012,1:17 PM

## 2012-11-14 NOTE — Progress Notes (Signed)
BSE completed, full report to follow.  Rec advance diet to regular/thin with aspiration precautions given pt easily becomes dyspneic and her kyphosis.    SLP used teach back to assure pt understood recommendations.    Donavan Burnet, MS [facility] SLP [phone]

## 2012-11-14 NOTE — Progress Notes (Signed)
Physical Therapy Treatment Patient Details Name: [name] MRN: [id number] DOB: [birth year] Today's Date: 11/14/2012 Time: 5621-3086 PT Time Calculation (min): 23 min  PT Assessment / Plan / Recommendation  PT Comments   Progressing with mobility. Continues to demonstrate general weakness and decreased activity tolerance.   Follow Up Recommendations  SNF     Does the patient have the potential to tolerate intense rehabilitation     Barriers to Discharge        Equipment Recommendations  None recommended by PT    Recommendations for Other Services    Frequency Min 3X/week   Progress towards PT Goals Progress towards PT goals: Progressing toward goals  Plan Current plan remains appropriate    Precautions / Restrictions Precautions Precautions: Fall Restrictions Weight Bearing Restrictions: No   Pertinent Vitals/Pain No c/o pain    Mobility  Bed Mobility Bed Mobility: Supine to Sit Supine to Sit: 4: Min assist;HOB elevated Sitting - Scoot to Edge of Bed: 4: Min assist Details for Bed Mobility Assistance: Assist for trunk to full upright and scooting to DOB Transfers Transfers: Sit to Stand;Stand to Sit Sit to Stand: 4: Min assist;From bed;From chair/3-in-1 Stand to Sit: 3: Mod assist;To chair/3-in-1 Details for Transfer Assistance: x2. Assist to rise, stabilize, control descent. VCS safety, technique, hand placement Ambulation/Gait Ambulation/Gait Assistance: 4: Min assist Ambulation Distance (Feet): 15 Feet (x2) Ambulation/Gait Assistance Details: Assist to stabilize and maneuver with RW. Followed with recliner-pt fatigues easily. Pt maintains flexed head/neck/trunk posture Gait Pattern: Decreased stride length;Step-through pattern;Trunk flexed;Decreased step length - right;Decreased step length - left    Exercises     PT Diagnosis:    PT Problem List:   PT Treatment Interventions:     PT Goals (current goals can now be found in the care plan section)     Visit Information  Last PT Received On: 11/14/12 Assistance Needed: +2 (for safety/chair amb) History of Present Illness: UTI    Subjective Data      Cognition  Cognition Arousal/Alertness: Awake/alert Behavior During Therapy: WFL for tasks assessed/performed Overall Cognitive Status: History of cognitive impairments - at baseline Memory: Decreased short-term memory    Balance     End of Session PT - End of Session Activity Tolerance: Patient limited by fatigue Patient left: in chair;with call bell/phone within reach Nurse Communication: Mobility status   GP     Rebeca Alert, MPT Pager: [phone]

## 2012-11-14 NOTE — Progress Notes (Signed)
[facility] PROGRESS NOTE  [name] ZOX:[id number] DOB: 11/12/1945 DOA: 11/09/2012 PCP: Pamelia Hoit, MD  Brief narrative 67 y.o. female with hx of constipation, HTN, Bipolar disorder, unsteady gait-ambulates with walker, fallen recently, presented to the ER from assisted living with altered mental status and hypotension. In the ER, she was found to have hypothermia with T of 37F. She was alert, awake and conversing. Work up in the ER included hypotension with SBP in the 70's, Cr elevated to 4.42, and Hb of 8.6 g/DL (Her recent Hb was found to be 10 g/DL. Her UA showed UTI, and her recent culture showed ESBL and she had received fosphocycin at the recommendation of ID. she was admitted to step down unit. She had persistent hypotension despite IV fluids. Critical care medicine was consulted. She was treated for septic shock with IV fluids and pressors. Nephrology consulted for acute renal failure and anion gap metabolic acidosis. She has been off pressors and blood pressures are elevated. Critical care medicine has seen on 7/9 and signed off.   Assessment/Plan: 1. UTI with Sepsis and shock: Improved with IVF-Dced. Now off pressors since 7/8. Appreciate PCCM assistance- signed off 7/9. ESBL was noted on culture from previous admission. On Imipenem-complete total 7 days of antibiotics-no by mouth options to transition based on sensitivity results of 10/30/12 (ESBL Escherichia coli). Current urine cultures show Candida. Blood cultures x2 negative to date. Will discuss with ID regarding antibiotic options-? Switch to Fosfomycin on DC. 2. Acute Pancreatitis; Etiology unclear. TG is normal. She doesn't have Gallbladder. LFT's are normal except for mildly elevated AlkP. Could be medication induced but no known offender identified. Lipase improving. Asymptomatic currently. 3. Acute on stage IV Chronic Renal Failure with metabolic acidosis: IVF with sod bicarb-DC'd. No hydronephrosis identified on  CT. Monitor UO- 8L after Lasix on 7/9. Nephrology input appreciated. Metabolic acidosis-resolved. Creatinine improving. Baseline creatinine: Probably 2.7. Creatinine has plateaued. Still with volume overload. Nephrology providing additional IV Lasix. Followup BMP. 4. Uncontrolled hypertension: Resume amlodipine, continue hydralazine and added oral labetalol. Blood pressure control is better. 5. Abdominal Distension/diarrhea/GERD: ABD films suggested ileus. Ct did not show same. Distension could be related to pancreatitis. Monitor. No antidiarrheals. C diff was negative. Now having mild diarrhea-decreasing. Abdominal distention seems to have resolved. Hold Amitiza 6. History of Bipolar Disorder: Resumed Xanax. Continue Lamictal, Trileptal and risperidone. Stable. 7. Hypothyroidism: TSh was 0.6. FT4 is low again. Dose of Synthroid was reduced last admission. She actually needs higher dose. Will increase dose. . Will need to be repeated as OP in 4-6 weeks.  8. Acute respiratory distress secondary to acidosis, pleural effusions, abdominal distention and kyphosis: Improved. Critical care team has discontinued BiPAP. Titrate oxygen to maintain saturations greater than 92%. Not on home oxygen. Monitor after Xanax. Improved after diuresis. 9. Hyponatremia: Resolved. 10. Anemia/thrombocytopenia/leukopenia: Thrombocytopenia resolved. WBC normalized. Hemoglobin stable. Anemia secondary to chronic kidney disease. Transfuse if hemoglobin less than 7 g per DL.  GI prophylaxis: Pepcid DVT prophylaxis: Subcutaneous heparin. Nutrition: Heart healthy diet Code Status: Partial Family Communication: Discussed for [name], sister on 11/12/12. Disposition Plan: Not stable for DC to SNF-probably Monday.   Consultants:  Nephrology  PCCM  Procedures:  Left IJ CVL 7/6 > 7/10  Foley catheter-keep for an additional 24 hours and then DC if okay with nephrology.  Antibiotics:  Imipenem 7/6 > will  complete 7 days.  Diflucan 7/7 > 7/7   HPI/Subjective: Intermittent mild dyspnea. No cough or chest pain. Voiding well.  Objective: Filed Vitals:   11/13/12 1438 11/13/12 1949 11/14/12 0453 11/14/12 1419  BP: 171/78 161/69 151/55 158/66  Pulse: 84 89 70 73  Temp: 98.4 F (36.9 C) 98.3 F (36.8 C) 98.2 F (36.8 C) 97.8 F (36.6 C)  TempSrc: Oral Oral Oral Oral  Resp: 20 18 18 20   Height:      Weight:      SpO2: 94% 95% 97% 94%    Intake/Output Summary (Last 24 hours) at 11/14/12 1735 Last data filed at 11/14/12 1255  Gross per 24 hour  Intake   1640 ml  Output   1003 ml  Net    637 ml   Filed Weights   11/11/12 0200 11/12/12 0500 11/13/12 1231  Weight: 80.8 kg (178 lb 2.1 oz) 80.6 kg (177 lb 11.1 oz) 76.023 kg (167 lb 9.6 oz)    Exam:   General exam: Comfortable.   Respiratory system: Reduced breath sounds in the bases with occasional basal crackles. Rest of lung fields clear to auscultation. No increased work of breathing.  Cardiovascular system: S1 & S2 heard, RRR. No JVD, murmurs, gallops, clicks has trace bilateral leg edema. Telemetry: Sinus rhythm with first degree AV block  Gastrointestinal system: Abdomen is nondistended, soft and nontender. Normal bowel sounds heard.  Central nervous system: Alert and oriented x3. No focal neurological deficits.  Extremities: Symmetric 5 x 5 power.  Musculoskeletal system: Significant kyphosis   Data Reviewed: Basic Metabolic Panel:  Recent Labs Lab 11/11/12 0410 11/11/12 1735 11/12/12 0600 11/13/12 0509 11/14/12 0445  NA 130* 134* 141 142 143  K 3.9 4.0 3.5 4.4 4.0  CL 104 101 104 104 105  CO2 12* 18* 24 26 26   GLUCOSE 123* 88 87 98 98  BUN 69* 68* 67* 63* 66*  CREATININE 4.34* 4.08* 3.84* 3.57* 3.62*  CALCIUM 7.1* 7.3* 7.8* 8.4 9.1  PHOS  --  7.9* 7.7* 7.4* 7.7*   Liver Function Tests:  Recent Labs Lab 11/09/12 1608 11/10/12 1000 11/11/12 0410 11/11/12 1735 11/12/12 0600 11/13/12 0509  11/14/12 0445  AST 19 20 18   --  17  --   --   ALT 30 31 27   --  23  --   --   ALKPHOS 232* 210* 175*  --  181*  --   --   BILITOT 0.1* 0.1* 0.1*  --  0.1*  --   --   PROT 6.2 5.8* 4.9*  --  5.3*  --   --   ALBUMIN 2.8* 2.7* 2.2* 2.4* 2.3* 2.4* 2.5*    Recent Labs Lab 11/10/12 1000 11/11/12 0410 11/12/12 0600  LIPASE 556* 248* 247*   No results found for this basename: AMMONIA,  in the last 168 hours CBC:  Recent Labs Lab 11/10/12 1000 11/11/12 0410 11/12/12 0600 11/13/12 0509 11/14/12 0445  WBC 5.9 3.3* 3.7* 4.6 5.8  HGB 9.1* 8.0* 8.5* 8.5* 9.2*  HCT 28.0* 24.1* 24.7* 25.6* 27.8*  MCV 97.2 96.4 93.6 94.8 95.5  PLT 177 139* 159 167 167   Cardiac Enzymes:  Recent Labs Lab 11/09/12 1608 11/10/12 0530 11/10/12 1237 11/10/12 2000  TROPONINI <0.30 <0.30 <0.30 <0.30   BNP (last 3 results) No results found for this basename: PROBNP,  in the last 8760 hours CBG:  Recent Labs Lab 11/09/12 1643  GLUCAP 157*    Recent Results (from the past 240 hour(s))  URINE CULTURE     Status: None   Collection Time    11/09/12  4:22  PM      Result Value Range Status   Specimen Description URINE, CATHETERIZED   Final   Special Requests NONE   Final   Culture  Setup Time 11/09/2012 23:14   Final   Colony Count >=100,000 COLONIES/ML   Final   Culture YEAST   Final   Report Status 11/10/2012 FINAL   Final  CULTURE, BLOOD (ROUTINE X 2)     Status: None   Collection Time    11/09/12  4:50 PM      Result Value Range Status   Specimen Description BLOOD LEFT HAND   Final   Special Requests BOTTLES DRAWN AEROBIC AND ANAEROBIC 4.5CC   Final   Culture  Setup Time 11/09/2012 21:38   Final   Culture     Final   Value:        BLOOD CULTURE RECEIVED NO GROWTH TO DATE CULTURE WILL BE HELD FOR 5 DAYS BEFORE ISSUING A FINAL NEGATIVE REPORT   Report Status PENDING   Incomplete  CULTURE, BLOOD (ROUTINE X 2)     Status: None   Collection Time    11/09/12  4:58 PM      Result Value  Range Status   Specimen Description BLOOD LEFT HAND   Final   Special Requests BOTTLES DRAWN AEROBIC AND ANAEROBIC 5CC   Final   Culture  Setup Time 11/09/2012 21:38   Final   Culture     Final   Value:        BLOOD CULTURE RECEIVED NO GROWTH TO DATE CULTURE WILL BE HELD FOR 5 DAYS BEFORE ISSUING A FINAL NEGATIVE REPORT   Report Status PENDING   Incomplete  MRSA PCR SCREENING     Status: None   Collection Time    11/09/12  8:43 PM      Result Value Range Status   MRSA by PCR NEGATIVE  NEGATIVE Final   Comment:            The GeneXpert MRSA Assay (FDA     approved for NASAL specimens     only), is one component of a     comprehensive MRSA colonization     surveillance program. It is not     intended to diagnose MRSA     infection nor to guide or     monitor treatment for     MRSA infections.  CLOSTRIDIUM DIFFICILE BY PCR     Status: None   Collection Time    11/10/12 11:57 AM      Result Value Range Status   C difficile by pcr NEGATIVE  NEGATIVE Final     Studies: Dg Chest 1 View  11/14/2012   *RADIOLOGY REPORT*  Clinical Data: Edema.  Pleural effusions.  CHEST - 1 VIEW  Comparison: Portable chest of 11/12/2012 and CT abdomen and pelvis 11/10/2012.  Findings: Left IJ catheter has been removed.  Hazy bibasilar opacities persist compatible with small pleural effusions.  There is cardiomegaly and vascular congestion.  No consolidative process is identified.  No pneumothorax.  IMPRESSION: No marked change in small bilateral pleural effusions.  Pulmonary vascular congestion again seen.   Original Report Authenticated By: Holley Dexter, M.D.     Additional labs:   Scheduled Meds: . ALPRAZolam  1 mg Oral Q8H  . amLODipine  10 mg Oral Daily  . calcitRIOL  0.5 mcg Oral Daily  . famotidine  20 mg Oral Daily  . heparin  5,000 Units Subcutaneous Q8H  . hydrALAZINE  25 mg Oral Q8H  . imipenem-cilastatin  250 mg Intravenous Q12H  . labetalol  200 mg Oral BID  . lamoTRIgine  200 mg  Oral BID  . levothyroxine  50 mcg Oral QAC breakfast  . omega-3 acid ethyl esters  4 g Oral Daily  . Oxcarbazepine  900 mg Oral BID  . risperiDONE  4 mg Oral BID  . sevelamer carbonate  800 mg Oral TID WC  . sodium chloride  3 mL Intravenous Q12H   Continuous Infusions:    Principal Problem:   UTI (urinary tract infection) Active Problems:   HYPOTHYROIDISM   BPLR I, DPRSD, MOST RECENT EPSD, MANIC, SVR   ANXIETY   CONSTIPATION, CHRONIC   RENAL INSUFFICIENCY, CHRONIC   Hypothermia due to non-environmental cause   ESBL (extended spectrum beta-lactamase) producing bacteria infection   Septic shock   Acute on chronic renal failure   Pancreatitis, acute   Metabolic acidosis    Time spent: 35 minutes    [facility]  [facility] Pager [phone].   If 8PM-8AM, please contact night-coverage at [email or web address], password [facility] 11/14/2012, 5:35 PM  LOS: 5 days             \

## 2012-11-14 NOTE — Progress Notes (Signed)
CSW spoke with patient re: SNF as recommended by PT - patient requesting [facility]. CSW confirmed with [facility] that they will have a bed available for her today. Awaiting nephrology to clear her. Patient aware, CSW left message for patient's sister, [name] (cell#: [phone]).   Clinical Social Work Department CLINICAL SOCIAL WORK PLACEMENT NOTE 11/14/2012  Patient:  [name], [name]  Account Number:  [id number] Admit date:  11/09/2012  Clinical Social Worker:  Orpah Greek  Date/time:  11/14/2012 12:08 PM  Clinical Social Work is seeking post-discharge placement for this patient at the following level of care:   SKILLED NURSING   (*CSW will update this form in Epic as items are completed)   11/14/2012  Patient/family provided with [facility] Department of Clinical Social Work's list of facilities offering this level of care within the geographic area requested by the patient (or if unable, by the patient's family).  11/14/2012  Patient/family informed of their freedom to choose among providers that offer the needed level of care, that participate in Medicare, Medicaid or managed care program needed by the patient, have an available bed and are willing to accept the patient.  11/14/2012  Patient/family informed of [facility]' ownership interest in [facility], as well as of the fact that they are under no obligation to receive care at this facility.  PASARR submitted to EDS on 11/14/2012 PASARR number received from EDS on 11/14/2012  FL2 transmitted to all facilities in geographic area requested by pt/family on  11/14/2012 FL2 transmitted to all facilities within larger geographic area on   Patient informed that his/her managed care company has contracts with or will negotiate with  certain facilities, including the following:     Patient/family informed of bed offers received:  11/14/2012 Patient chooses bed at [facility] Physician recommends and patient chooses bed at    Patient to be transferred to [facility] on   Patient to be transferred to facility by   The following physician request were entered in Epic:   Additional Comments:   Unice Bailey, LCSW [facility] Clinical Social Worker cell #: [phone]

## 2012-11-14 NOTE — Progress Notes (Signed)
Occupational Therapy Treatment Patient Details Name: [name] MRN: [id number] DOB: 1946-02-13 Today's Date: 11/14/2012 Time: 8413-2440 OT Time Calculation (min): 29 min  OT Assessment / Plan / Recommendation  OT comments  Pt worked on adl this afternoon.  She fatiques easily, dyspnea 2/4 and needs rest breaks  Follow Up Recommendations  SNF    Barriers to Discharge       Equipment Recommendations  3 in 1 bedside comode    Recommendations for Other Services    Frequency Min 2X/week   Progress towards OT Goals Progress towards OT goals: Progressing toward goals  Plan      Precautions / Restrictions Precautions Precautions: Fall Restrictions Weight Bearing Restrictions: No   Pertinent Vitals/Pain No c/o pain.    ADL  Upper Body Bathing: Set up Where Assessed - Upper Body Bathing: Supported sitting Lower Body Bathing: Moderate assistance (pt 60%) Where Assessed - Lower Body Bathing: Supported sit to stand Upper Body Dressing: Minimal assistance Where Assessed - Upper Body Dressing: Supported sitting (due to lines) Lower Body Dressing: Maximal assistance (socks only) Equipment Used: Rolling walker Transfers/Ambulation Related to ADLs: pt had just gotten off commode ADL Comments: able to reach to feet for adls but fatiques quickly Pt able to clean peri area with min A for balance   OT Diagnosis:    OT Problem List:   OT Treatment Interventions:     OT Goals(current goals can now be found in the care plan section)    Visit Information  Last OT Received On: 11/14/12 Assistance Needed: +2 History of Present Illness: [name] is an 67 y.o. female with hx of constipation, HTN, Bipolar disorder, unsteady gait, fallen recently, presents to the ER from assisted living as she was having altered mental status and hypotension. She had hospital admission last month after a fall    Subjective Data      Prior Functioning       Cognition   Cognition Arousal/Alertness: Awake/alert Behavior During Therapy: WFL for tasks assessed/performed Overall Cognitive Status: History of cognitive impairments - at baseline    Mobility  Transfers Sit to Stand: 4: Min assist;From chair/3-in-1;With upper extremity assist Stand to Sit: 4: Min assist;To chair/3-in-1;With armrests;With upper extremity assist Details for Transfer Assistance: assist to rise and steady for adls.    Exercises      Balance Dynamic Standing Balance Dynamic Standing - Balance Support: Left upper extremity supported Dynamic Standing - Level of Assistance: 4: Min assist Standardized Balance Assessment Standardized Balance Assessment:  (1 minute for adls)   End of Session OT - End of Session Activity Tolerance: Patient limited by fatigue Patient left: in chair;with call bell/phone within reach  GO     Jasean Ambrosia 11/14/2012, 3:15 PM Marica Otter, OTR/L [phone] 11/14/2012

## 2012-11-14 NOTE — Evaluation (Signed)
Clinical/Bedside Swallow Evaluation Patient Details  Name: [name] MRN: [id number] Date of Birth: 30-Oct-1945  Today's Date: 11/14/2012 Time: 4098-1191 SLP Time Calculation (min): 28 min  Past Medical History:  Past Medical History  Diagnosis Date  . Constipation   . Bipolar 1 disorder   . Hypertension   . Esophageal reflux   . Anxiety   . Back pain   . Osteoarthritis   . Gout   . Adenomatous polyps 03-27-07  . Diverticulosis   . Internal hemorrhoids   . Hypothyroidism   . Renal insufficiency    Past Surgical History:  Past Surgical History  Procedure Laterality Date  . Cholecystectomy    . Appendectomy    . Tonsillectomy    . Tubal ligation     HPI:  67 yo female adm to [facility] with Stage Iv CKD, ecoli UTI, sepsis, ? acute pancreatitis.  Pt has been SOB due to acidosis, vs pleural effusion, abdominal distention and kyphosis.  CXR showed bilateral pleural effusions with passive ATX.  Pt has h/o GERD in her medical chart but she states she is not certain of that diagnosis.  She denies reflux symptoms.  Does admit to occasinal sensation of pills lodging in pharynx.     Assessment / Plan / Recommendation Clinical Impression  Pt presents with functional oropharyngeal swallow ability without s/s of aspiration noted during po intake observed.  Pt is kyphotic which may impair her esophageal clearance and with her distended abdomen may impair ability to clear her airway.  Pt noted to become mildly dyspenic with po intake but is aware and able to accommodate her deficits.  SLP educated pt to importance to adequate respiratory status for safest po intake including taking frequent rest breaks as needed.  Provided compensations in writing.  As pt verbalized some difficulty swallowing pils at times, advised her to alternative ways to consume medications.  Rec advance to regular/thin.      Aspiration Risk  Mild    Diet Recommendation Regular;Thin liquid   Liquid Administration via:  Straw;Cup Medication Administration:  (as tolerated) Supervision: Patient able to self feed Compensations: Small sips/bites;Slow rate Postural Changes and/or Swallow Maneuvers: Upright 30-60 min after meal    Other  Recommendations Oral Care Recommendations: Oral care BID   Follow Up Recommendations  None    Frequency and Duration        Pertinent Vitals/Pain Afebrile, decreased    SLP Swallow Goals  n/a   Swallow Study Prior Functional Status   pt denies dysphagia    General Date of Onset: 11/14/12 HPI: 67 yo female adm to [facility] with Stage Iv CKD, ecoli UTI, sepsis, ? acute pancreatitis.  Pt has been SOB due to acidosis, vs pleural effusion, abdominal distention and kyphosis.  CXR showed bilateral pleural effusions with passive ATX.  Pt has h/o GERD in her medical chart but she states she is not certain of that diagnosis.  She denies reflux symptoms.  Does admit to occasinal sensation of pills lodging in pharynx.   Type of Study: Bedside swallow evaluation Previous Swallow Assessment: none Diet Prior to this Study: Dysphagia 3 (soft);Thin liquids Temperature Spikes Noted: No Respiratory Status: Supplemental O2 delivered via (comment) History of Recent Intubation: No Behavior/Cognition: Alert;Cooperative Oral Cavity - Dentition: Dentures, top;Dentures, bottom Self-Feeding Abilities: Able to feed self Patient Positioning: Postural control interferes with function (pt is kyphotic) Baseline Vocal Quality: Wet (minimal wet quality to voice, pt states present x few days) Volitional Cough:  (DNT) Volitional Swallow:  Able to elicit    Oral/Motor/Sensory Function Overall Oral Motor/Sensory Function: Appears within functional limits for tasks assessed   Ice Chips Ice chips: Not tested   Thin Liquid Thin Liquid: Within functional limits Presentation: Self Fed;Straw    Nectar Thick Nectar Thick Liquid: Not tested   Honey Thick Honey Thick Liquid: Not tested   Puree Puree: Within  functional limits Presentation: Self Fed;Spoon   Solid   GO    Solid: Within functional limits Presentation: Self [name], MS [facility] SLP [phone]

## 2012-11-14 NOTE — Progress Notes (Signed)
Echocardiogram 2D Echocardiogram has been performed.  Urban [employer] 11/14/2012, 3:07 PM

## 2012-11-15 ENCOUNTER — Inpatient Hospital Stay (HOSPITAL_COMMUNITY): Payer: Medicare Other

## 2012-11-15 LAB — RENAL FUNCTION PANEL
Albumin: 2.4 g/dL — ABNORMAL LOW (ref 3.5–5.2)
BUN: 69 mg/dL — ABNORMAL HIGH (ref 6–23)
Calcium: 8.8 mg/dL (ref 8.4–10.5)
Chloride: 102 mEq/L (ref 96–112)
Creatinine, Ser: 3.65 mg/dL — ABNORMAL HIGH (ref 0.50–1.10)

## 2012-11-15 LAB — HEPATIC FUNCTION PANEL
Alkaline Phosphatase: 164 U/L — ABNORMAL HIGH (ref 39–117)
Total Bilirubin: 0.2 mg/dL — ABNORMAL LOW (ref 0.3–1.2)

## 2012-11-15 LAB — CULTURE, BLOOD (ROUTINE X 2): Culture: NO GROWTH

## 2012-11-15 MED ORDER — FLUCONAZOLE 50 MG PO TABS
50.0000 mg | ORAL_TABLET | Freq: Every day | ORAL | Status: DC
  Administered 2012-11-15 – 2012-11-17 (×3): 50 mg via ORAL
  Filled 2012-11-15 (×3): qty 1

## 2012-11-15 MED ORDER — FUROSEMIDE 80 MG PO TABS
80.0000 mg | ORAL_TABLET | Freq: Every day | ORAL | Status: DC
  Administered 2012-11-15 – 2012-11-17 (×3): 80 mg via ORAL
  Filled 2012-11-15 (×3): qty 1

## 2012-11-15 MED ORDER — LABETALOL HCL 300 MG PO TABS
300.0000 mg | ORAL_TABLET | Freq: Two times a day (BID) | ORAL | Status: DC
  Administered 2012-11-15 – 2012-11-17 (×5): 300 mg via ORAL
  Filled 2012-11-15 (×6): qty 1

## 2012-11-15 NOTE — Progress Notes (Signed)
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  2 UTI E.Coli (esbl) & Funguria  3 Acidosis, resolved  4 Hypertension  5 Secondary hyperparathyroidism and hyperphosphatemia  Plan:  Begin PO Lasix, f/u Echo results   Subjective: Interval History: Good UOP; breathing better Objective: Vital signs in last 24 hours: Temp:  [97.8 F (36.6 C)-98.6 F (37 C)] 98.6 F (37 C) (07/12 0700) Pulse Rate:  [71-78] 78 (07/12 0700) Resp:  [18-20] 18 (07/12 0700) BP: (158-160)/(63-73) 160/73 mmHg (07/12 0700) SpO2:  [94 %-98 %] 98 % (07/12 0700) Weight change:  Intake/Output from previous day: 07/11 0701 - 07/12 0700 In: 1140 [P.O.:840; IV Piggyback:300] Out: 4901 [Urine:4900; Stool:1] Intake/Output this shift:    General appearance: alert and cooperative Resp: diminished breath sounds bilaterally Chest wall: no tenderness, severely kyphotic Cardio: regular rate and rhythm and systolic murmur: systolic ejection 2/6, crescendo and decrescendo ?diastolic Extremities: edema 1+ Lab Results:  Recent Labs  11/13/12 0509 11/14/12 0445  WBC 4.6 5.8  HGB 8.5* 9.2*  HCT 25.6* 27.8*  PLT 167 167   BMET:  Recent Labs  11/14/12 0445 11/15/12 0523  NA 143 139  K 4.0 3.9  CL 105 102  CO2 26 26  GLUCOSE 98 111*  BUN 66* 69*  CREATININE 3.62* 3.65*  CALCIUM 9.1 8.8   No results found for this basename: PTH,  in the last 72 hours Iron Studies: No results found for this basename: IRON, TIBC, TRANSFERRIN, FERRITIN,  in the last 72 hours Studies/Results: Dg Chest 1 View  11/14/2012   *RADIOLOGY REPORT*  Clinical Data: Edema.  Pleural effusions.  CHEST - 1 VIEW  Comparison: Portable chest of 11/12/2012 and CT abdomen and pelvis 11/10/2012.  Findings: Left IJ catheter has been removed.  Hazy bibasilar opacities persist compatible with small pleural effusions.  There is  cardiomegaly and vascular congestion.  No consolidative process is identified.  No pneumothorax.  IMPRESSION: No marked change in small bilateral pleural effusions.  Pulmonary vascular congestion again seen.   Original Report Authenticated By: Holley Dexter, M.D.   Scheduled: . ALPRAZolam  1 mg Oral Q8H  . amLODipine  10 mg Oral Daily  . calcitRIOL  0.5 mcg Oral Daily  . famotidine  20 mg Oral Daily  . heparin  5,000 Units Subcutaneous Q8H  . hydrALAZINE  25 mg Oral Q8H  . imipenem-cilastatin  250 mg Intravenous Q12H  . labetalol  300 mg Oral BID  . lamoTRIgine  200 mg Oral BID  . levothyroxine  50 mcg Oral QAC breakfast  . omega-3 acid ethyl esters  4 g Oral Daily  . Oxcarbazepine  900 mg Oral BID  . risperiDONE  4 mg Oral BID  . sevelamer carbonate  800 mg Oral TID WC  . sodium chloride  3 mL Intravenous Q12H     LOS: 6 days   Robie Oats C 11/15/2012,7:59 AM

## 2012-11-15 NOTE — Progress Notes (Signed)
ANTIBIOTIC CONSULT NOTE - FOLLOW UP  Pharmacy Consult for Primaxin Indication: Urosepsis  No Known Allergies  Patient Measurements: Height: 5\' 6"  (167.6 cm) Weight: 167 lb 9.6 oz (76.023 kg) IBW/kg (Calculated) : 59.3  Vital Signs: Temp: 98.6 F (37 C) (07/12 0700) Temp src: Oral (07/12 0700) BP: 160/73 mmHg (07/12 0700) Pulse Rate: 78 (07/12 0700) Intake/Output from previous day: 07/11 0701 - 07/12 0700 In: 1140 [P.O.:840; IV Piggyback:300] Out: 4901 [Urine:4900; Stool:1]  Labs:  Recent Labs  11/13/12 0509 11/14/12 0445 11/15/12 0523  WBC 4.6 5.8  --   HGB 8.5* 9.2*  --   PLT 167 167  --   CREATININE 3.57* 3.62* 3.65*   Estimated Creatinine Clearance: 16 ml/min (by C-G formula based on Cr of 3.65).  Anti-infectives: 7/6 Zosyn x1 7/6 Primaxin >>  Assessment: 67 yoF admit on 7/6 from ALF with c/o AMS and hypotension.  Recent Urine culture from 6/26 with ESBL ecoli.  Pharmacy is asked to assist with Primaxin dosing for urosepsis.  Day #7 Primaxin.  Acute on CKD, SCr improving very slowly with CrCl ~ 15 ml/min  Afebrile and WBC wnl, blood cx negative to date  Urine culture with > 100,000 yeast  - Nephrology ordered fluconazole 50mg  PO daily x 7 days to start today  Goal of Therapy:  Dose per weight/renal function, eradication of infection.   Plan:   Continue Primaxin 250mg  IV q12h  Per MD notes, should complete today; possible transition to fosfomycin noted  Loralee Pacas, PharmD, BCPS Pager: [phone]  11/15/2012 8:56 AM

## 2012-11-15 NOTE — Consult Note (Signed)
Referring Provider: [facility] Dr. Waymon Amato Primary Care Physician:  Pamelia Hoit, MD Primary Gastroenterologist:  Dr.Perry  Reason for Consultation: Pancreatitis HPI: [name] is a 67 y.o. female  with multiple medical problems who is a resident of an assisted living facility. She has history of schizophrenia and bipolar disorder, hypertension and chronic renal insufficiency. She is known to Dr. Marina Goodell from colonoscopy that was done in 2008 and was found to have diverticulosis and one small polyp. She is status post cholecystectomy. She's been followed in our office for problems with constipation. She was admitted to the hospital on 11/09/2012 with lethargy and hypotension with systolic blood pressure in the 70s on arrival to the ER. She was found to be septic from a UTI and also in acute renal failure. She is being treated for an ESBL and fungal urinary tract infection and is improving. On 11/10/2012 she underwent noncontrasted CT scan of the abdomen and pelvis due to complaints of abdominal pain and was found to have increased attenuation in the retroperitoneal at around the pancreas, she status post cholecystectomy and also had bilateral perinephric inflammatory changes which are felt to be chronic. Lipase was then checked and was elevated at 556. She has had persistent elevation of her lipase which is 429 today . Liver function studies are normal with the exception of a mildly elevated alkaline phosphatase. Symptomatically she has complaint of abdominal discomfort when asked but also has been eating solid food without difficulty and has no complaint of nausea . She says she had been having some diarrhea but has not had a bowel movement in the past 2 days. She does feel that her abdomen is more protuberant than usual .   Past Medical History  Diagnosis Date  . Constipation   . Bipolar 1 disorder   . Hypertension   . Esophageal reflux   . Anxiety   . Back pain   . Osteoarthritis   .  Gout   . Adenomatous polyps 03-27-07  . Diverticulosis   . Internal hemorrhoids   . Hypothyroidism   . Renal insufficiency     Past Surgical History  Procedure Laterality Date  . Cholecystectomy    . Appendectomy    . Tonsillectomy    . Tubal ligation      Prior to Admission medications   Medication Sig Start Date End Date Taking? Authorizing Provider  acetaminophen (TYLENOL) 500 MG tablet Take 1,000 mg by mouth every 4 (four) hours as needed for pain.    Yes Historical Provider, MD  ALPRAZolam Prudy Feeler) 1 MG tablet Take 1 tablet (1 mg total) by mouth 3 (three) times daily. 11/03/12  Yes Dorothea Ogle, MD  ALPRAZolam Prudy Feeler) 1 MG tablet Take 1 tablet (1 mg total) by mouth every 6 (six) hours as needed for anxiety. 11/03/12  Yes Dorothea Ogle, MD  amLODipine (NORVASC) 10 MG tablet Take 10 mg by mouth at bedtime.    Yes Historical Provider, MD  cyclobenzaprine (FLEXERIL) 5 MG tablet Take 5 mg by mouth 3 (three) times daily as needed for muscle spasms.    Yes Historical Provider, MD  docusate sodium (COLACE) 100 MG capsule Take 200-300 mg by mouth 2 (two) times daily. 3 caps in am, 2 caps in pm   Yes Historical Provider, MD  famotidine (PEPCID) 20 MG tablet Take 20 mg by mouth 2 (two) times daily.     Yes Historical Provider, MD  guaifenesin (ROBITUSSIN) 100 MG/5ML syrup Take 200 mg by mouth 4 (  four) times daily as needed for cough.    Yes Historical Provider, MD  hydrALAZINE (APRESOLINE) 25 MG tablet Take 1 tablet (25 mg total) by mouth every 8 (eight) hours. 11/03/12  Yes Dorothea Ogle, MD  lamoTRIgine (LAMICTAL) 200 MG tablet Take 200 mg by mouth 2 (two) times daily.    Yes Historical Provider, MD  levothyroxine (SYNTHROID, LEVOTHROID) 25 MCG tablet Take 25 mcg by mouth daily before breakfast.   Yes Historical Provider, MD  lubiprostone (AMITIZA) 24 MCG capsule Take 24 mcg by mouth 2 (two) times daily with a meal.   Yes Historical Provider, MD  magnesium hydroxide (MILK OF MAGNESIA) 400  MG/5ML suspension Take 30-60 mLs by mouth daily as needed for constipation.   Yes Historical Provider, MD  meclizine (ANTIVERT) 25 MG tablet Take 1 tablet (25 mg total) by mouth 3 (three) times daily as needed for dizziness. 06/11/12  Yes John L Molpus, MD  omega-3 acid ethyl esters (LOVAZA) 1 G capsule Take 4 g by mouth daily.    Yes Historical Provider, MD  Oxcarbazepine (TRILEPTAL) 300 MG tablet Take 900 mg by mouth 2 (two) times daily.    Yes Historical Provider, MD  risperiDONE (RISPERDAL) 2 MG tablet Take 4 mg by mouth 2 (two) times daily.    Yes Historical Provider, MD  sodium phosphate (FLEET) enema Place 1 enema rectally as needed (for constipation, may self-administer.).    Yes Historical Provider, MD  traMADol (ULTRAM) 50 MG tablet Take 50 mg by mouth 2 (two) times daily.   Yes Historical Provider, MD  fosfomycin (MONUROL) 3 G PACK Please take 3 g once, continue 11/03/12   Dorothea Ogle, MD    Current Facility-Administered Medications  Medication Dose Route Frequency Provider Last Rate Last Dose  . acetaminophen (TYLENOL) tablet 650 mg  650 mg Oral Q6H PRN Elease Etienne, MD   650 mg at 11/15/12 0558  . ALPRAZolam (XANAX) tablet 1 mg  1 mg Oral Q8H Coralyn Helling, MD   1 mg at 11/15/12 0547  . amLODipine (NORVASC) tablet 10 mg  10 mg Oral Daily Elease Etienne, MD   10 mg at 11/14/12 1147  . calcitRIOL (ROCALTROL) capsule 0.5 mcg  0.5 mcg Oral Daily Lauris Poag, MD   0.5 mcg at 11/14/12 1517  . famotidine (PEPCID) tablet 20 mg  20 mg Oral Daily Elease Etienne, MD   20 mg at 11/14/12 1147  . fluconazole (DIFLUCAN) tablet 50 mg  50 mg Oral Daily Lauris Poag, MD      . furosemide (LASIX) tablet 80 mg  80 mg Oral Daily Lauris Poag, MD      . heparin injection 5,000 Units  5,000 Units Subcutaneous Q8H Houston Siren, MD   5,000 Units at 11/15/12 0547  . hydrALAZINE (APRESOLINE) tablet 25 mg  25 mg Oral Q8H Leslye Peer, MD   25 mg at 11/15/12 0547  . imipenem-cilastatin (PRIMAXIN) 250  mg in sodium chloride 0.9 % 100 mL IVPB  250 mg Intravenous Q12H Leann Trefz Poindexter, RPH   250 mg at 11/15/12 0548  . labetalol (NORMODYNE) tablet 300 mg  300 mg Oral BID Elease Etienne, MD      . labetalol (NORMODYNE,TRANDATE) injection 10 mg  10 mg Intravenous Q2H PRN Zigmund Gottron, MD   10 mg at 11/13/12 0409  . lamoTRIgine (LAMICTAL) tablet 200 mg  200 mg Oral BID Houston Siren, MD   200 mg at 11/14/12  2148  . levothyroxine (SYNTHROID, LEVOTHROID) tablet 50 mcg  50 mcg Oral QAC breakfast Osvaldo Shipper, MD   50 mcg at 11/14/12 0817  . omega-3 acid ethyl esters (LOVAZA) capsule 4 g  4 g Oral Daily Houston Siren, MD      . Oxcarbazepine (TRILEPTAL) tablet 900 mg  900 mg Oral BID Houston Siren, MD   900 mg at 11/14/12 2118  . phenol (CHLORASEPTIC) mouth spray 1 spray  1 spray Mouth/Throat PRN Osvaldo Shipper, MD   1 spray at 11/10/12 1013  . risperiDONE (RISPERDAL) tablet 4 mg  4 mg Oral BID Osvaldo Shipper, MD   4 mg at 11/14/12 2118  . sevelamer carbonate (RENVELA) tablet 800 mg  800 mg Oral TID WC Lauris Poag, MD   800 mg at 11/14/12 1929  . sodium chloride 0.9 % injection 3 mL  3 mL Intravenous Q12H Houston Siren, MD   3 mL at 11/14/12 1150  . sorbitol 70 % solution 30 mL  30 mL Oral Daily PRN Zigmund Gottron, MD        Allergies as of 11/09/2012  . (No Known Allergies)    Family History  Problem Relation Age of Onset  . Hypertension Mother   . Atrial fibrillation Father   . Colon cancer Neg Hx   . Heart disease Mother     History   Social History  . Marital Status: Married    Spouse Name: N/A    Number of Children: 0  . Years of Education: N/A   Occupational History  . disabled    Social History Main Topics  . Smoking status: Former Smoker    Quit date: 05/08/2003  . Smokeless tobacco: Never Used  . Alcohol Use: No  . Drug Use: No  . Sexually Active: No   Other Topics Concern  . Not on file   Social History Narrative  . No narrative on file    Review of  Systems: Pertinent positive and negative review of systems were noted in the above HPI section.  All other review of systems was otherwise negative.  Physical Exam: Vital signs in last 24 hours: Temp:  [97.8 F (36.6 C)-98.6 F (37 C)] 98.6 F (37 C) (07/12 0700) Pulse Rate:  [71-78] 78 (07/12 0700) Resp:  [18-20] 18 (07/12 0700) BP: (158-160)/(63-73) 160/73 mmHg (07/12 0700) SpO2:  [94 %-98 %] 98 % (07/12 0700) Last BM Date: 11/13/12 General:   Alert,  Well-developed, very kyphotic older white female yelling for her nurse. pleasant and cooperative in NAD Head:  Normocephalic and atraumatic. Eyes:  Sclera clear, no icterus.   Conjunctiva pink. Ears:  Normal auditory acuity. Nose:  No deformity, discharge,  or lesions. Mouth:  No deformity or lesions.   Neck:  Supple; no masses or thyromegaly.Kyphotic Lungs:  Clear throughout to auscultation. Decreased breath sounds bilateral bases . Heart:  Regular rate and rhythm; no murmurs, clicks, rubs,  or gallops. Abdomen:  Soft, protuberant and mildly distended bowel  sounds are active he is somewhat tympanitic no significant focal tenderness  Msk:  Symmetrical without gross deformities. . Pulses:  Normal pulses noted. Extremities:  Without clubbing or edema. Neurologic:  Alert and  oriented x4;  grossly normal neurologically. Skin:  Intact without significant lesions or rashes.. Psych:  Alert and cooperative. odd affect   Intake/Output from previous day: 07/11 0701 - 07/12 0700 In: 1140 [P.O.:840; IV Piggyback:300] Out: 4901 [Urine:4900; Stool:1] Intake/Output this shift: Total I/O In: 320 [P.O.:320] Out:  400 [Urine:400]  Lab Results:  Recent Labs  11/13/12 0509 11/14/12 0445  WBC 4.6 5.8  HGB 8.5* 9.2*  HCT 25.6* 27.8*  PLT 167 167   BMET  Recent Labs  11/13/12 0509 11/14/12 0445 11/15/12 0523  NA 142 143 139  K 4.4 4.0 3.9  CL 104 105 102  CO2 26 26 26   GLUCOSE 98 98 111*  BUN 63* 66* 69*  CREATININE 3.57*  3.62* 3.65*  CALCIUM 8.4 9.1 8.8   LFT  Recent Labs  11/15/12 0800  PROT 5.5*  ALBUMIN 2.4*  AST 15  ALT 15  ALKPHOS 164*  BILITOT 0.2*  BILIDIR <0.1  IBILI NOT CALCULATED   PT/INR No results found for this basename: LABPROT, INR,  in the last 72 hours Hepatitis Panel No results found for this basename: HEPBSAG, HCVAB, HEPAIGM, HEPBIGM,  in the last 72 hours    Studies/Results: Dg Chest 1 View  11/14/2012   *RADIOLOGY REPORT*  Clinical Data: Edema.  Pleural effusions.  CHEST - 1 VIEW  Comparison: Portable chest of 11/12/2012 and CT abdomen and pelvis 11/10/2012.  Findings: Left IJ catheter has been removed.  Hazy bibasilar opacities persist compatible with small pleural effusions.  There is cardiomegaly and vascular congestion.  No consolidative process is identified.  No pneumothorax.  IMPRESSION: No marked change in small bilateral pleural effusions.  Pulmonary vascular congestion again seen.   Original Report Authenticated By: Holley Dexter, M.D.    IMPRESSION:  #29  67 year old white female admitted 6 days ago with urosepsis and hypotension and noted incidentally on CT scan to have a mild pancreatitis. Lipase has been persistently elevated. Clinically patient does not appear to have a significant pancreatitis and is eating without difficulty. Etiology of the pancreatitis is not clear. She is status post cholecystectomy, there is no ductal diet dilation noted on noncontrasted CT. She was hypotensive on admission and mild ischemic pancreatitis is in the differential. Also she was on Trileptal on admission which can cause pancreatitis #2 stage IV chronic kidney disease-acute worsening with current illness #3 hypertension #4 schizophrenia and bipolar disorder #5 chronic constipation #6 chronic anemia #7 diverticulosis     PLAN: #1 plain abdominal films today-rule out ileus #2 eventually patient may benefit from an MRI however she does not feel that she could handle being  in a closed MRI and we are limited with her current kidney function. It may be best to allow her to recover from her sepsis completely and follow as an outpatient for further abdominal imaging in several weeks. #3 Though  Trileptal is listed as potential cause for pancreatitis would not discontinue at this time without discussion with her psychiatrist.   Mike Gip  11/15/2012, 10:36 AM   ________________________________________________________________________  [facility] MD note:  I personally examined the patient, reviewed the data and agree with the assessment and plan described above.  CT 5 days ago "Questionable findings suggesting the possibility of mild acute pancreatitis."  Her lipase has been elevated as well.  She however does not have any clinical symptoms of pancreatitis.  Eating well and KUB just now looked normal to me.  I do not think pancreatic enzymes need to be followed any further.  If she did have acute pancreatitis recent it was very mild, probably related to her septic state.  I agree that follow up MRI/MRCP of pancreas is a good idea but that should at least be another 5-6 weeks from now to allow more time for her to  recover from her UTI, sepsis.     Rob Bunting, MD [facility] Gastroenterology Pager [phone]

## 2012-11-15 NOTE — Progress Notes (Signed)
[facility] PROGRESS NOTE  DOREAN HIEBERT WUJ:[id number] DOB: 03-11-1946 DOA: 11/09/2012 PCP: Pamelia Hoit, MD  Brief narrative 67 y.o. female with hx of constipation, HTN, Bipolar disorder, unsteady gait-ambulates with walker, fallen recently, presented to the ER from assisted living with altered mental status and hypotension. In the ER, she was found to have hypothermia with T of 39F. She was alert, awake and conversing. Work up in the ER included hypotension with SBP in the 70's, Cr elevated to 4.42, and Hb of 8.6 g/DL (Her recent Hb was found to be 10 g/DL. Her UA showed UTI, and her recent culture showed ESBL and she had received fosphocycin at the recommendation of ID. she was admitted to step down unit. She had persistent hypotension despite IV fluids. Critical care medicine was consulted. She was treated for septic shock with IV fluids and pressors. Nephrology consulted for acute renal failure and anion gap metabolic acidosis. She has been off pressors and blood pressures are elevated. Critical care medicine has seen on 7/9 and signed off.   Assessment/Plan: 1. UTI with Sepsis and shock: Improved with IVF-Dced. Now off pressors since 7/8. Appreciate PCCM assistance- signed off 7/9. ESBL was noted on culture from previous admission. On Imipenem-complete total 7 days of antibiotics-no by mouth options to transition based on sensitivity results of 10/30/12 (ESBL Escherichia coli). Current urine cultures show Candida. Blood cultures x2 negative to date. Discussed with ID-complete one week of IV imipenem and DC antibiotics. No treatment for candiduria. Diflucan started by nephrology for possible genital/perineal fungal infection. 2. Acute Pancreatitis; Etiology unclear. TG is normal. She doesn't have Gallbladder. LFT's are normal except for mildly elevated AlkP. Could be medication induced but no known offender identified. Asymptomatic currently. Lipase increasing again-[facility]  consulted. 3. Acute on stage IV Chronic Renal Failure with metabolic acidosis: IVF with sod bicarb-DC'd. No hydronephrosis identified on CT. Monitor UO- 8L after Lasix on 7/9. Nephrology input appreciated. Metabolic acidosis-resolved. Creatinine improving. Baseline creatinine: Probably 2.7. Creatinine has plateaued. Still with volume overload. Nephrology providing additional IV Lasix. Followup BMP. 4. Uncontrolled hypertension: Resume amlodipine, continue hydralazine and added oral labetalol. Blood pressure control is better. We'll increase labetalol to 300 mg twice a day. 5. Abdominal Distension/diarrhea/GERD: ABD films suggested ileus. Ct did not show same. Distension could be related to pancreatitis. Monitor. No antidiarrheals. C diff was negative. Now having mild diarrhea-decreasing. Abdominal distention seems to have resolved. Hold Amitiza 6. History of Bipolar Disorder: Resumed Xanax. Continue Lamictal, Trileptal and risperidone. Stable. 7. Hypothyroidism: TSh was 0.6. FT4 is low again. Dose of Synthroid was reduced last admission. She actually needs higher dose. Will increase dose. . Will need to be repeated as OP in 4-6 weeks.  8. Acute respiratory distress secondary to acidosis, pleural effusions, abdominal distention and kyphosis: Improved. Critical care team has discontinued BiPAP. Titrate oxygen to maintain saturations greater than 92%. Not on home oxygen. Monitor after Xanax. Improved after diuresis. 9. Hyponatremia: Resolved. 10. Anemia/thrombocytopenia/leukopenia: Thrombocytopenia resolved. WBC normalized. Hemoglobin stable. Anemia secondary to chronic kidney disease. Transfuse if hemoglobin less than 7 g per DL.  GI prophylaxis: Pepcid DVT prophylaxis: Subcutaneous heparin. Nutrition: Heart healthy diet Code Status: Partial Family Communication: Discussed for [name], sister on 11/12/12. Disposition Plan: Not stable for DC to SNF-probably  Monday.   Consultants:  Nephrology  PCCM  Procedures:  Left IJ CVL 7/6 > 7/10  Foley catheter-keep for an additional 24 hours and then DC if okay with nephrology.  Antibiotics:  Imipenem  7/6 > will complete 7 days.  Diflucan 7/7 > 7/7   HPI/Subjective: Deny specific complaints but just doesn't feel as well as yesterday.  Objective: Filed Vitals:   11/14/12 2053 11/14/12 2118 11/15/12 0700 11/15/12 1417  BP: 159/63 159/63 160/73 158/62  Pulse: 71 72 78 78  Temp: 98 F (36.7 C) 98.1 F (36.7 C) 98.6 F (37 C) 98.6 F (37 C)  TempSrc: Oral Oral Oral Oral  Resp: 18 18 18 18   Height:      Weight:      SpO2: 96% 95% 98% 98%    Intake/Output Summary (Last 24 hours) at 11/15/12 1515 Last data filed at 11/15/12 1417  Gross per 24 hour  Intake    760 ml  Output   5250 ml  Net  -4490 ml   Filed Weights   11/11/12 0200 11/12/12 0500 11/13/12 1231  Weight: 80.8 kg (178 lb 2.1 oz) 80.6 kg (177 lb 11.1 oz) 76.023 kg (167 lb 9.6 oz)    Exam:   General exam: Comfortable. Sitting on reclining chair.  Respiratory system: Reduced breath sounds in the bases with occasional basal crackles. Rest of lung fields clear to auscultation. No increased work of breathing.  Cardiovascular system: S1 & S2 heard, RRR. No JVD, murmurs, gallops, clicks has trace bilateral leg edema. Telemetry: Sinus rhythm with first degree AV block  Gastrointestinal system: Abdomen is nondistended, soft and nontender. Normal bowel sounds heard.  Central nervous system: Alert and oriented x3. No focal neurological deficits.  Extremities: Symmetric 5 x 5 power.  Musculoskeletal system: Significant kyphosis   Data Reviewed: Basic Metabolic Panel:  Recent Labs Lab 11/11/12 1735 11/12/12 0600 11/13/12 0509 11/14/12 0445 11/15/12 0523  NA 134* 141 142 143 139  K 4.0 3.5 4.4 4.0 3.9  CL 101 104 104 105 102  CO2 18* 24 26 26 26   GLUCOSE 88 87 98 98 111*  BUN 68* 67* 63* 66* 69*  CREATININE  4.08* 3.84* 3.57* 3.62* 3.65*  CALCIUM 7.3* 7.8* 8.4 9.1 8.8  PHOS 7.9* 7.7* 7.4* 7.7* 8.0*   Liver Function Tests:  Recent Labs Lab 11/09/12 1608 11/10/12 1000 11/11/12 0410  11/12/12 0600 11/13/12 0509 11/14/12 0445 11/15/12 0523 11/15/12 0800  AST 19 20 18   --  17  --   --   --  15  ALT 30 31 27   --  23  --   --   --  15  ALKPHOS 232* 210* 175*  --  181*  --   --   --  164*  BILITOT 0.1* 0.1* 0.1*  --  0.1*  --   --   --  0.2*  PROT 6.2 5.8* 4.9*  --  5.3*  --   --   --  5.5*  ALBUMIN 2.8* 2.7* 2.2*  < > 2.3* 2.4* 2.5* 2.4* 2.4*  < > = values in this interval not displayed.  Recent Labs Lab 11/10/12 1000 11/11/12 0410 11/12/12 0600 11/15/12 0523  LIPASE 556* 248* 247* 429*   No results found for this basename: AMMONIA,  in the last 168 hours CBC:  Recent Labs Lab 11/10/12 1000 11/11/12 0410 11/12/12 0600 11/13/12 0509 11/14/12 0445  WBC 5.9 3.3* 3.7* 4.6 5.8  HGB 9.1* 8.0* 8.5* 8.5* 9.2*  HCT 28.0* 24.1* 24.7* 25.6* 27.8*  MCV 97.2 96.4 93.6 94.8 95.5  PLT 177 139* 159 167 167   Cardiac Enzymes:  Recent Labs Lab 11/09/12 1608 11/10/12 0530 11/10/12 1237 11/10/12 2000  TROPONINI <0.30 <0.30 <0.30 <0.30   BNP (last 3 results) No results found for this basename: PROBNP,  in the last 8760 hours CBG:  Recent Labs Lab 11/09/12 1643  GLUCAP 157*    Recent Results (from the past 240 hour(s))  URINE CULTURE     Status: None   Collection Time    11/09/12  4:22 PM      Result Value Range Status   Specimen Description URINE, CATHETERIZED   Final   Special Requests NONE   Final   Culture  Setup Time 11/09/2012 23:14   Final   Colony Count >=100,000 COLONIES/ML   Final   Culture YEAST   Final   Report Status 11/10/2012 FINAL   Final  CULTURE, BLOOD (ROUTINE X 2)     Status: None   Collection Time    11/09/12  4:50 PM      Result Value Range Status   Specimen Description BLOOD LEFT HAND   Final   Special Requests BOTTLES DRAWN AEROBIC AND  ANAEROBIC 4.5CC   Final   Culture  Setup Time 11/09/2012 21:38   Final   Culture NO GROWTH 5 DAYS   Final   Report Status 11/15/2012 FINAL   Final  CULTURE, BLOOD (ROUTINE X 2)     Status: None   Collection Time    11/09/12  4:58 PM      Result Value Range Status   Specimen Description BLOOD LEFT HAND   Final   Special Requests BOTTLES DRAWN AEROBIC AND ANAEROBIC 5CC   Final   Culture  Setup Time 11/09/2012 21:38   Final   Culture NO GROWTH 5 DAYS   Final   Report Status 11/15/2012 FINAL   Final  MRSA PCR SCREENING     Status: None   Collection Time    11/09/12  8:43 PM      Result Value Range Status   MRSA by PCR NEGATIVE  NEGATIVE Final   Comment:            The GeneXpert MRSA Assay (FDA     approved for NASAL specimens     only), is one component of a     comprehensive MRSA colonization     surveillance program. It is not     intended to diagnose MRSA     infection nor to guide or     monitor treatment for     MRSA infections.  CLOSTRIDIUM DIFFICILE BY PCR     Status: None   Collection Time    11/10/12 11:57 AM      Result Value Range Status   C difficile by pcr NEGATIVE  NEGATIVE Final     Studies: Dg Chest 1 View  11/14/2012   *RADIOLOGY REPORT*  Clinical Data: Edema.  Pleural effusions.  CHEST - 1 VIEW  Comparison: Portable chest of 11/12/2012 and CT abdomen and pelvis 11/10/2012.  Findings: Left IJ catheter has been removed.  Hazy bibasilar opacities persist compatible with small pleural effusions.  There is cardiomegaly and vascular congestion.  No consolidative process is identified.  No pneumothorax.  IMPRESSION: No marked change in small bilateral pleural effusions.  Pulmonary vascular congestion again seen.   Original Report Authenticated By: Holley Dexter, M.D.   Dg Abd 2 Views  11/15/2012   *RADIOLOGY REPORT*  Clinical Data: Abdominal pain and distention  ABDOMEN - 2 VIEW  Comparison: 11/10/2012  Findings: Improving colonic dilatation compared with the prior  study.  There is distention of  the stomach.  There is moderate stool in the right colon.  Colon is not dilated and there is no evidence of small bowel obstruction.  No free air.  Surgical clips in the gallbladder fossa.  IMPRESSION: Resolving ileus.  Moderate retained stool in the right colon.   Original Report Authenticated By: Janeece Riggers, M.D.     Additional labs:   Scheduled Meds: . ALPRAZolam  1 mg Oral Q8H  . amLODipine  10 mg Oral Daily  . calcitRIOL  0.5 mcg Oral Daily  . famotidine  20 mg Oral Daily  . fluconazole  50 mg Oral Daily  . furosemide  80 mg Oral Daily  . heparin  5,000 Units Subcutaneous Q8H  . hydrALAZINE  25 mg Oral Q8H  . imipenem-cilastatin  250 mg Intravenous Q12H  . labetalol  300 mg Oral BID  . lamoTRIgine  200 mg Oral BID  . levothyroxine  50 mcg Oral QAC breakfast  . omega-3 acid ethyl esters  4 g Oral Daily  . Oxcarbazepine  900 mg Oral BID  . risperiDONE  4 mg Oral BID  . sevelamer carbonate  800 mg Oral TID WC  . sodium chloride  3 mL Intravenous Q12H   Continuous Infusions:    Principal Problem:   UTI (urinary tract infection) Active Problems:   HYPOTHYROIDISM   BPLR I, DPRSD, MOST RECENT EPSD, MANIC, SVR   ANXIETY   CONSTIPATION, CHRONIC   RENAL INSUFFICIENCY, CHRONIC   Hypothermia due to non-environmental cause   ESBL (extended spectrum beta-lactamase) producing bacteria infection   Septic shock   Acute on chronic renal failure   Pancreatitis, acute   Metabolic acidosis    Time spent: 25 minutes    [facility]  [facility] Pager [phone].   If 8PM-8AM, please contact night-coverage at [email or web address], password [email or web address] [facility] 11/15/2012, 3:15 PM  LOS: 6 days             \

## 2012-11-16 LAB — RENAL FUNCTION PANEL
Albumin: 2.6 g/dL — ABNORMAL LOW (ref 3.5–5.2)
Calcium: 8.8 mg/dL (ref 8.4–10.5)
GFR calc Af Amer: 16 mL/min — ABNORMAL LOW (ref >90)
GFR calc non Af Amer: 14 mL/min — ABNORMAL LOW (ref >90)
Phosphorus: 7.9 mg/dL — ABNORMAL HIGH (ref 2.3–4.6)
Potassium: 4.4 mEq/L (ref 3.5–5.1)
Sodium: 139 mEq/L (ref 135–145)

## 2012-11-16 NOTE — Progress Notes (Signed)
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  I will be happy to see in follow up in 4- 6 weeks  Subjective: Interval History: Excellent urine output  Objective: Vital signs in last 24 hours: Temp:  [98 F (36.7 C)-98.8 F (37.1 C)] 98.8 F (37.1 C) (07/13 0558) Pulse Rate:  [71-78] 72 (07/13 0558) Resp:  [18] 18 (07/13 0558) BP: (142-158)/(56-62) 146/56 mmHg (07/13 0558) SpO2:  [94 %-98 %] 94 % (07/13 0558) Weight change:   Intake/Output from previous day: 07/12 0701 - 07/13 0700 In: 1200 [P.O.:1200] Out: 5150 [Urine:5150] Intake/Output this shift:    General appearance: alert and cooperative Back: kyphotic Resp: clear to auscultation bilaterally Cardio: regular rate and rhythm and systolic murmur: systolic ejection 2/6, crescendo and decrescendo at lower left sternal border, radiates to carotids Extremities: edema reduced to 1+  Lab Results:  Recent Labs  11/14/12 0445  WBC 5.8  HGB 9.2*  HCT 27.8*  PLT 167   BMET:  Recent Labs  11/15/12 0523 11/16/12 0428  NA 139 139  K 3.9 4.4  CL 102 102  CO2 26 26  GLUCOSE 111* 92  BUN 69* 69*  CREATININE 3.65* 3.29*  CALCIUM 8.8 8.8   No results found for this basename: PTH,  in the last 72 hours Iron Studies: No results found for this basename: IRON, TIBC, TRANSFERRIN, FERRITIN,  in the last 72 hours Studies/Results: Dg Chest 1 View  11/14/2012   *RADIOLOGY REPORT*  Clinical Data: Edema.  Pleural effusions.  CHEST - 1 VIEW  Comparison: Portable chest of 11/12/2012 and CT abdomen and pelvis 11/10/2012.  Findings: Left IJ catheter has been removed.  Hazy bibasilar  opacities persist compatible with small pleural effusions.  There is cardiomegaly and vascular congestion.  No consolidative process is identified.  No pneumothorax.  IMPRESSION: No marked change in small bilateral pleural effusions.  Pulmonary vascular congestion again seen.   Original Report Authenticated By: Holley Dexter, M.D.   Dg Abd 2 Views  11/15/2012   *RADIOLOGY REPORT*  Clinical Data: Abdominal pain and distention  ABDOMEN - 2 VIEW  Comparison: 11/10/2012  Findings: Improving colonic dilatation compared with the prior study.  There is distention of the stomach.  There is moderate stool in the right colon.  Colon is not dilated and there is no evidence of small bowel obstruction.  No free air.  Surgical clips in the gallbladder fossa.  IMPRESSION: Resolving ileus.  Moderate retained stool in the right colon.   Original Report Authenticated By: Janeece Riggers, M.D.   Scheduled: . ALPRAZolam  1 mg Oral Q8H  . amLODipine  10 mg Oral Daily  . calcitRIOL  0.5 mcg Oral Daily  . famotidine  20 mg Oral Daily  . fluconazole  50 mg Oral Daily  . furosemide  80 mg Oral Daily  . heparin  5,000 Units Subcutaneous Q8H  . hydrALAZINE  25 mg Oral Q8H  . labetalol  300 mg Oral BID  . lamoTRIgine  200 mg Oral BID  . levothyroxine  50 mcg Oral QAC breakfast  . omega-3 acid ethyl esters  4 g Oral Daily  . Oxcarbazepine  900 mg Oral BID  . risperiDONE  4 mg Oral BID  . sevelamer carbonate  800 mg Oral TID WC  . sodium chloride  3 mL Intravenous Q12H     LOS: 7 days   Brenae Lasecki C 11/16/2012,8:45 AM

## 2012-11-16 NOTE — Progress Notes (Signed)
[facility] PROGRESS NOTE  DENIZ ESKRIDGE RUE:[id number] DOB: 21-Sep-1945 DOA: 11/09/2012 PCP: Pamelia Hoit, MD  Brief narrative 67 y.o. female with hx of constipation, HTN, Bipolar disorder, unsteady gait-ambulates with walker, fallen recently, presented to the ER from assisted living with altered mental status and hypotension. In the ER, she was found to have hypothermia with T of 42F. She was alert, awake and conversing. Work up in the ER included hypotension with SBP in the 70's, Cr elevated to 4.42, and Hb of 8.6 g/DL (Her recent Hb was found to be 10 g/DL. Her UA showed UTI, and her recent culture showed ESBL and she had received fosphocycin at the recommendation of ID. she was admitted to step down unit. She had persistent hypotension despite IV fluids. Critical care medicine was consulted. She was treated for septic shock with IV fluids and pressors. Nephrology consulted for acute renal failure and anion gap metabolic acidosis. She has been off pressors and blood pressures are elevated. Critical care medicine has seen on 7/9 and signed off.   Assessment/Plan: 1. UTI with Sepsis and shock: Improved with IVF-Dced. Now off pressors since 7/8. Appreciate PCCM assistance- signed off 7/9. ESBL was noted on culture from previous admission. On Imipenem-complete total 7 days of antibiotic-sensitivity results of 10/30/12 (ESBL Escherichia coli). Current urine cultures show Candida. Blood cultures x2 negative to date. Discussed with ID-complete one week of IV imipenem and DC antibiotics. No treatment for candiduria. Diflucan started by nephrology for possible genital/perineal fungal infection (vulvovaginitis). 2. Acute Pancreatitis; Etiology unclear. TG is normal. She doesn't have Gallbladder. LFT's are normal except for mildly elevated AlkP. Could be medication induced. Asymptomatic currently. Lipase increasing again-[facility] input appreciated-indicate that even if she did have pancreatitis, it was  mild and probably related to her septic state. Followup MRI/MRCP of the pancreas and 5-6 weeks after she has recovered from UTI, sepsis and acute renal failure. 3. Acute on stage IV Chronic Renal Failure with metabolic acidosis: IVF with sod bicarb-DC'd. No hydronephrosis identified on CT. Monitor UO- 8L after Lasix on 7/9. Nephrology input appreciated. Metabolic acidosis-resolved. Creatinine improving. Baseline creatinine: Probably 2.7. Creatinine has plateaued. Still with volume overload. Acute renal failure secondary to septic shock. 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 Creatinine better than yesterday. Per nephrology, continue furosemide 80 mg daily and OP follow up in 4-6 weeks.  4. Uncontrolled hypertension: Resume amlodipine, continue hydralazine and added oral labetalol. Blood pressure control is better. We'll increase labetalol to 300 mg twice a day-improved. 5. Abdominal Distension/diarrhea/GERD: ABD films suggested ileus. Ct did not show same. Distension could be related to pancreatitis. Monitor. No antidiarrheals. C diff was negative. Now having mild diarrhea-decreasing. Abdominal distention seems to have resolved. Hold Amitiza 6. History of Bipolar Disorder: Resumed Xanax. Continue Lamictal, Trileptal and risperidone. Stable. 7. Hypothyroidism: TSh was 0.6. FT4 is low again. Dose of Synthroid was reduced last admission. She actually needs higher dose. Will increase dose. . Will need to be repeated as OP in 4-6 weeks.  8. Acute respiratory distress secondary to acidosis, pleural effusions, abdominal distention and kyphosis: Improved. Critical care team has discontinued BiPAP. Titrate oxygen to maintain saturations greater than 92%. Not on home oxygen. Monitor after Xanax. Improved after diuresis. 9. Hyponatremia: Resolved. 10. Anemia/thrombocytopenia/leukopenia: Thrombocytopenia resolved. WBC normalized. Hemoglobin stable. Anemia  secondary to chronic kidney disease. Transfuse if hemoglobin less than 7 g per DL.  GI prophylaxis: Pepcid DVT prophylaxis: Subcutaneous heparin. Nutrition: Heart healthy diet Code  Status: Partial Family Communication: Discussed for [name], sister on 11/12/12. Disposition Plan: Not stable for DC to SNF-probably Monday.   Consultants:  Nephrology  PCCM  Procedures:  Left IJ CVL 7/6 > 7/10  Foley catheter-keep for an additional 24 hours and then DC if okay with nephrology.  Antibiotics:  Imipenem 7/6 > will complete 7 days.  Diflucan 7/7 > 7/7  Diflucan oral 7/12 >  HPI/Subjective: Back pain. Denies any other complaints.  Objective: Filed Vitals:   11/15/12 1417 11/15/12 2148 11/16/12 0558 11/16/12 1024  BP: 158/62 142/58 146/56 149/64  Pulse: 78 71 72 82  Temp: 98.6 F (37 C) 98 F (36.7 C) 98.8 F (37.1 C)   TempSrc: Oral Oral Oral   Resp: 18 18 18    Height:      Weight:      SpO2: 98% 96% 94%     Intake/Output Summary (Last 24 hours) at 11/16/12 1235 Last data filed at 11/16/12 1100  Gross per 24 hour  Intake    880 ml  Output   5050 ml  Net  -4170 ml   Filed Weights   11/11/12 0200 11/12/12 0500 11/13/12 1231  Weight: 80.8 kg (178 lb 2.1 oz) 80.6 kg (177 lb 11.1 oz) 76.023 kg (167 lb 9.6 oz)    Exam:   General exam: Comfortable. Sitting on reclining chair.  Respiratory system: Reduced breath sounds in the bases. Rest of lung fields clear to auscultation. No increased work of breathing.  Cardiovascular system: S1 & S2 heard, RRR. No JVD, murmurs, gallops, clicks has trace bilateral leg edema. Telemetry: Sinus rhythm with first degree AV block  Gastrointestinal system: Abdomen is mildly distended but soft and nontender. Normal bowel sounds heard.  Central nervous system: Alert and oriented x3. No focal neurological deficits.  Extremities: Symmetric 5 x 5 power.  Musculoskeletal system: Significant kyphosis. No acute  signs.   Data Reviewed: Basic Metabolic Panel:  Recent Labs Lab 11/12/12 0600 11/13/12 0509 11/14/12 0445 11/15/12 0523 11/16/12 0428  NA 141 142 143 139 139  K 3.5 4.4 4.0 3.9 4.4  CL 104 104 105 102 102  CO2 24 26 26 26 26   GLUCOSE 87 98 98 111* 92  BUN 67* 63* 66* 69* 69*  CREATININE 3.84* 3.57* 3.62* 3.65* 3.29*  CALCIUM 7.8* 8.4 9.1 8.8 8.8  PHOS 7.7* 7.4* 7.7* 8.0* 7.9*   Liver Function Tests:  Recent Labs Lab 11/09/12 1608 11/10/12 1000 11/11/12 0410  11/12/12 0600 11/13/12 0509 11/14/12 0445 11/15/12 0523 11/15/12 0800 11/16/12 0428  AST 19 20 18   --  17  --   --   --  15  --   ALT 30 31 27   --  23  --   --   --  15  --   ALKPHOS 232* 210* 175*  --  181*  --   --   --  164*  --   BILITOT 0.1* 0.1* 0.1*  --  0.1*  --   --   --  0.2*  --   PROT 6.2 5.8* 4.9*  --  5.3*  --   --   --  5.5*  --   ALBUMIN 2.8* 2.7* 2.2*  < > 2.3* 2.4* 2.5* 2.4* 2.4* 2.6*  < > = values in this interval not displayed.  Recent Labs Lab 11/10/12 1000 11/11/12 0410 11/12/12 0600 11/15/12 0523  LIPASE 556* 248* 247* 429*   No results found for this basename: AMMONIA,  in the last 168 hours CBC:  Recent Labs Lab 11/10/12 1000 11/11/12 0410 11/12/12 0600 11/13/12 0509 11/14/12 0445  WBC 5.9 3.3* 3.7* 4.6 5.8  HGB 9.1* 8.0* 8.5* 8.5* 9.2*  HCT 28.0* 24.1* 24.7* 25.6* 27.8*  MCV 97.2 96.4 93.6 94.8 95.5  PLT 177 139* 159 167 167   Cardiac Enzymes:  Recent Labs Lab 11/09/12 1608 11/10/12 0530 11/10/12 1237 11/10/12 2000  TROPONINI <0.30 <0.30 <0.30 <0.30   BNP (last 3 results) No results found for this basename: PROBNP,  in the last 8760 hours CBG:  Recent Labs Lab 11/09/12 1643  GLUCAP 157*    Recent Results (from the past 240 hour(s))  URINE CULTURE     Status: None   Collection Time    11/09/12  4:22 PM      Result Value Range Status   Specimen Description URINE, CATHETERIZED   Final   Special Requests NONE   Final   Culture  Setup Time  11/09/2012 23:14   Final   Colony Count >=100,000 COLONIES/ML   Final   Culture YEAST   Final   Report Status 11/10/2012 FINAL   Final  CULTURE, BLOOD (ROUTINE X 2)     Status: None   Collection Time    11/09/12  4:50 PM      Result Value Range Status   Specimen Description BLOOD LEFT HAND   Final   Special Requests BOTTLES DRAWN AEROBIC AND ANAEROBIC 4.5CC   Final   Culture  Setup Time 11/09/2012 21:38   Final   Culture NO GROWTH 5 DAYS   Final   Report Status 11/15/2012 FINAL   Final  CULTURE, BLOOD (ROUTINE X 2)     Status: None   Collection Time    11/09/12  4:58 PM      Result Value Range Status   Specimen Description BLOOD LEFT HAND   Final   Special Requests BOTTLES DRAWN AEROBIC AND ANAEROBIC 5CC   Final   Culture  Setup Time 11/09/2012 21:38   Final   Culture NO GROWTH 5 DAYS   Final   Report Status 11/15/2012 FINAL   Final  MRSA PCR SCREENING     Status: None   Collection Time    11/09/12  8:43 PM      Result Value Range Status   MRSA by PCR NEGATIVE  NEGATIVE Final   Comment:            The GeneXpert MRSA Assay (FDA     approved for NASAL specimens     only), is one component of a     comprehensive MRSA colonization     surveillance program. It is not     intended to diagnose MRSA     infection nor to guide or     monitor treatment for     MRSA infections.  CLOSTRIDIUM DIFFICILE BY PCR     Status: None   Collection Time    11/10/12 11:57 AM      Result Value Range Status   C difficile by pcr NEGATIVE  NEGATIVE Final     Studies: Dg Chest 1 View  11/14/2012   *RADIOLOGY REPORT*  Clinical Data: Edema.  Pleural effusions.  CHEST - 1 VIEW  Comparison: Portable chest of 11/12/2012 and CT abdomen and pelvis 11/10/2012.  Findings: Left IJ catheter has been removed.  Hazy bibasilar opacities persist compatible with small pleural effusions.  There is cardiomegaly and vascular congestion.  No consolidative process is  identified.  No pneumothorax.  IMPRESSION: No marked  change in small bilateral pleural effusions.  Pulmonary vascular congestion again seen.   Original Report Authenticated By: Holley Dexter, M.D.   Dg Abd 2 Views  11/15/2012   *RADIOLOGY REPORT*  Clinical Data: Abdominal pain and distention  ABDOMEN - 2 VIEW  Comparison: 11/10/2012  Findings: Improving colonic dilatation compared with the prior study.  There is distention of the stomach.  There is moderate stool in the right colon.  Colon is not dilated and there is no evidence of small bowel obstruction.  No free air.  Surgical clips in the gallbladder fossa.  IMPRESSION: Resolving ileus.  Moderate retained stool in the right colon.   Original Report Authenticated By: Janeece Riggers, M.D.     Additional labs:   Scheduled Meds: . ALPRAZolam  1 mg Oral Q8H  . amLODipine  10 mg Oral Daily  . calcitRIOL  0.5 mcg Oral Daily  . famotidine  20 mg Oral Daily  . fluconazole  50 mg Oral Daily  . furosemide  80 mg Oral Daily  . heparin  5,000 Units Subcutaneous Q8H  . hydrALAZINE  25 mg Oral Q8H  . labetalol  300 mg Oral BID  . lamoTRIgine  200 mg Oral BID  . levothyroxine  50 mcg Oral QAC breakfast  . omega-3 acid ethyl esters  4 g Oral Daily  . Oxcarbazepine  900 mg Oral BID  . risperiDONE  4 mg Oral BID  . sevelamer carbonate  800 mg Oral TID WC  . sodium chloride  3 mL Intravenous Q12H   Continuous Infusions:    Principal Problem:   UTI (urinary tract infection) Active Problems:   HYPOTHYROIDISM   BPLR I, DPRSD, MOST RECENT EPSD, MANIC, SVR   ANXIETY   CONSTIPATION, CHRONIC   RENAL INSUFFICIENCY, CHRONIC   Hypothermia due to non-environmental cause   ESBL (extended spectrum beta-lactamase) producing bacteria infection   Septic shock   Acute on chronic renal failure   Pancreatitis, acute   Metabolic acidosis    Time spent: 25 minutes    [facility]  [facility] Pager [phone].   If 8PM-8AM, please contact night-coverage at [email or web address], password  [facility] 11/16/2012, 12:35 PM  LOS: 7 days             \

## 2012-11-17 LAB — RENAL FUNCTION PANEL
Albumin: 2.5 g/dL — ABNORMAL LOW (ref 3.5–5.2)
Calcium: 9.2 mg/dL (ref 8.4–10.5)
Creatinine, Ser: 3.39 mg/dL — ABNORMAL HIGH (ref 0.50–1.10)
GFR calc non Af Amer: 13 mL/min — ABNORMAL LOW (ref >90)
Phosphorus: 7.4 mg/dL — ABNORMAL HIGH (ref 2.3–4.6)
Sodium: 140 mEq/L (ref 135–145)

## 2012-11-17 MED ORDER — LABETALOL HCL 300 MG PO TABS: 300.0000 mg | ORAL_TABLET | Freq: Two times a day (BID) | ORAL | Status: DC

## 2012-11-17 MED ORDER — ALPRAZOLAM 1 MG PO TABS: 1.0000 mg | ORAL_TABLET | Freq: Three times a day (TID) | ORAL | Status: DC

## 2012-11-17 MED ORDER — ACETAMINOPHEN 325 MG PO TABS: 650.0000 mg | ORAL_TABLET | Freq: Four times a day (QID) | ORAL | Status: DC | PRN

## 2012-11-17 MED ORDER — FUROSEMIDE 40 MG PO TABS: 40.0000 mg | ORAL_TABLET | Freq: Every day | ORAL | Status: DC

## 2012-11-17 MED ORDER — LEVOTHYROXINE SODIUM 50 MCG PO TABS: 50.0000 ug | ORAL_TABLET | Freq: Every day | ORAL | Status: DC

## 2012-11-17 MED ORDER — FLUCONAZOLE 50 MG PO TABS: 50.0000 mg | ORAL_TABLET | Freq: Every day | ORAL | Status: DC

## 2012-11-17 MED ORDER — SEVELAMER CARBONATE 800 MG PO TABS: 800.0000 mg | ORAL_TABLET | Freq: Three times a day (TID) | ORAL | Status: AC

## 2012-11-17 MED ORDER — CALCITRIOL 0.5 MCG PO CAPS: 0.5000 ug | ORAL_CAPSULE | Freq: Every day | ORAL | Status: DC

## 2012-11-17 MED ORDER — FAMOTIDINE 20 MG PO TABS: 20.0000 mg | ORAL_TABLET | Freq: Every day | ORAL | Status: AC

## 2012-11-17 NOTE — Progress Notes (Signed)
Physical Therapy Treatment Patient Details Name: [name] MRN: [id number] DOB: 09-28-1945 Today's Date: 11/17/2012 Time: 0865-7846 PT Time Calculation (min): 29 min  PT Assessment / Plan / Recommendation  PT Comments   Assisted pt OOB to [facility] then amb in hallway.  Pt plans to D/C to [facility] for ST rehab.  Follow Up Recommendations  SNF     Does the patient have the potential to tolerate intense rehabilitation     Barriers to Discharge        Equipment Recommendations  None recommended by PT    Recommendations for Other Services    Frequency Min 3X/week   Progress towards PT Goals Progress towards PT goals: Progressing toward goals  Plan Current plan remains appropriate    Precautions / Restrictions Precautions Precautions: Fall Precaution Comments: anxiety Restrictions Weight Bearing Restrictions: No    Pertinent Vitals/Pain No c/o pain other than headache (RN aware)    Mobility  Bed Mobility Bed Mobility: Supine to Sit;Sitting - Scoot to Edge of Bed Supine to Sit: 4: Min guard Sitting - Scoot to [place] of Bed: 4: Min guard Details for Bed Mobility Assistance: increased time Transfers Transfers: Sit to Stand;Stand to Sit Sit to Stand: 4: Min guard;From bed;From chair/3-in-1;From toilet Stand to Sit: 4: Min guard;To chair/3-in-1;To toilet Details for Transfer Assistance: increased time Ambulation/Gait Ambulation/Gait Assistance: 4: Min guard Ambulation Distance (Feet): 185 Feet Assistive device: Rolling walker Ambulation/Gait Assistance Details: increased time and <25% VC's on safety with turns.  Gait Pattern: Decreased stride length;Step-through pattern;Trunk flexed;Decreased step length - right;Decreased step length - left Gait velocity: decreased General Gait Details: pt has severe forward head and kyphosis posture which impairs her gait and balance     PT Goals (current goals can now be found in the care plan section) Acute Rehab PT Goals Patient  Stated Goal: see husband at ALF  Visit Information  Last PT Received On: 11/17/12 Assistance Needed: +2 History of Present Illness: [name] is an 67 y.o. female with hx of constipation, HTN, Bipolar disorder, unsteady gait, fallen recently, presents to the ER from assisted living as she was having altered mental status and hypotension. She had hospital admission last month after a fall    Subjective Data  Patient Stated Goal: see husband at ALF   Cognition  Cognition Arousal/Alertness: Awake/alert Behavior During Therapy: WFL for tasks assessed/performed Overall Cognitive Status: History of cognitive impairments - at baseline    Balance     End of Session PT - End of Session Equipment Utilized During Treatment: Gait belt Activity Tolerance: Patient limited by fatigue Patient left: in chair;with call bell/phone within reach   Felecia Shelling  PTA [facility]  Acute  Rehab Pager      [phone]

## 2012-11-17 NOTE — Progress Notes (Signed)
Occupational Therapy Treatment Patient Details Name: [name] MRN: [id number] DOB: 06-09-1945 Today's Date: 11/17/2012 Time: 4098-1191 OT Time Calculation (min): 8 min  OT Assessment / Plan / Recommendation  OT comments  Pt states she misses her husband who is at ALF  Follow Up Recommendations  SNF             Frequency Min 2X/week   Progress towards OT Goals Progress towards OT goals: Progressing toward goals  Plan Discharge plan remains appropriate    Precautions / Restrictions Precautions Precautions: Fall Restrictions Weight Bearing Restrictions: No       ADL  Grooming: Performed;Wash/dry face;Brushing hair Where Assessed - Grooming: Unsupported sitting Toilet Transfer: Simulated;Minimal assistance Toilet Transfer Method: Sit to stand;Other (comment) (from chair) Transfers/Ambulation Related to ADLs: pt exhausted after walking.  Pt reports she may go to rehab today      OT Goals(current goals can now be found in the care plan section) Acute Rehab OT Goals Patient Stated Goal: see husband at ALF  Visit Information  Last OT Received On: 11/17/12 History of Present Illness: [name] is an 67 y.o. female with hx of constipation, HTN, Bipolar disorder, unsteady gait, fallen recently, presents to the ER from assisted living as she was having altered mental status and hypotension. She had hospital admission last month after a fall          Cognition  Cognition Arousal/Alertness: Awake/alert Behavior During Therapy: WFL for tasks assessed/performed Overall Cognitive Status: History of cognitive impairments - at baseline    Mobility  Transfers Sit to Stand: 4: Min assist;From chair/3-in-1;With upper extremity assist Stand to Sit: 4: Min assist;To chair/3-in-1;With armrests;With upper extremity assist Details for Transfer Assistance: assist to rise and steady for adls.          End of Session OT - End of Session Activity Tolerance: Patient limited by  fatigue Patient left: in chair;with call bell/phone within reach  GO     [facility], [place] [facility] 11/17/2012, 11:53 AM

## 2012-11-17 NOTE — Discharge Summary (Signed)
Physician Discharge Summary  [name] ZOX:[id number] DOB: December 12, 1945 DOA: 11/09/2012  PCP: Pamelia Hoit, MD  Admit date: 11/09/2012 Discharge date: 11/17/2012  Time spent: Greater than 30 minutes  Recommendations for Outpatient Follow-up:  1. Dr. Benedetto Goad, PCP 2. [facility] days with repeat labs (renal panel & CBC) 3. Dr. Casimiro Needle, Nephrology. MDs office will call for followup appointment to be seen in 5-6 weeks. 4. Recommend repeating renal panel once a week. 5. Repeat thyroid function tests in 4-6 weeks and OP endocrinology consultation. 6. Chronic benzodiazepine prescriptions need to be refilled in 5 days. 7. MRI/MRCP of the pancreas in 4-6 weeks. 8. Consider outpatient psychiatry followup.  Discharge Diagnoses:  Principal Problem:   UTI (urinary tract infection) Active Problems:   HYPOTHYROIDISM   BPLR I, DPRSD, MOST RECENT EPSD, MANIC, SVR   ANXIETY   CONSTIPATION, CHRONIC   RENAL INSUFFICIENCY, CHRONIC   Hypothermia due to non-environmental cause   ESBL (extended spectrum beta-lactamase) producing bacteria infection   Septic shock   Acute on chronic renal failure   Pancreatitis, acute   Metabolic acidosis   Discharge Condition: Improved & Stable  Diet recommendation: Renal diet  Filed Weights   11/11/12 0200 11/12/12 0500 11/13/12 1231  Weight: 80.8 kg (178 lb 2.1 oz) 80.6 kg (177 lb 11.1 oz) 76.023 kg (167 lb 9.6 oz)    History of present illness:  67 y.o. female with hx of constipation, HTN, Bipolar disorder, unsteady gait-ambulates with walker, fallen recently, presented to the ER from assisted living with altered mental status and hypotension. In the ER, she was found to have hypothermia with T of 79F. She was alert, awake and conversing. Work up in the ER included hypotension with SBP in the 70's, Cr elevated to 4.42, and Hb of 8.6 g/DL (Her recent Hb was found to be 10 g/DL. Her UA showed UTI, and her recent culture  showed ESBL and she had received fosphocycin at the recommendation of ID. she was admitted to step down unit.   Hospital Course:  1. Septic shock: Likely secondary to UTI. Patient was admitted to the step down unit. She was aggressively hydrated with IV fluids despite which she remained hypotensive. Left IJ central line was placed. Medical care medicine was consulted. She was briefly placed on pressors. Cultures were sent off. She was empirically placed on IV imipenem based on previous urine cultures which had shown ESBL Escherichia coli. Blood cultures are negative. Urine cultures show yeast. Patient has completed one week of IV imipenem-discontinued. Discussed with infectious disease who do not recommend treating yeast seen on urine culture. With above measures, patient stabilized. She was transferred to regular bed.  2. ESBL Escherichia coli UTI: Management as above. Completed treatment with one week of IV imipenem. 3. Fungal vulvovaginitis: Patient complains of vaginal pruritus. She is day 3/7 of oral Diflucan. Same will be completed after 11/21/12 dose. 4. Acute pancreatitis: Etiology unclear. This was based on CT abdomen which had questionable findings suggestive of possible mild acute pancreatitis and elevated lipase. Triglycerides normal. She is status post cholecystectomy. patient has been asymptomatic. LFTs were really unremarkable. [facility] was consulted. They suggested that if she did have acute pancreatitis recently it was very mild, probably related to her septic state and recommend MRI/MRCP of the pancreas in 5-6 weeks to allow time for her to recover from UTI and sepsis. Trileptal could potentially cause pancreatitis but would not discontinue without discussion with her psychiatrist, especially  given that she is asymptomatic. 5. 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 Acute renal  failure was from septic shock. Nephrology was consulted. She was initially treated with IV fluids but subsequently became volume overloaded and complained of dyspnea. She was then treated with brief IV Lasix and diuresed well. She has no further dyspnea. Discussed with Dr. Lowell Guitar who recommends discharge on Lasix 40 mg daily and followup of her renal panel once a week. His office will arrange for outpatient followup in for-6 weeks. 6. Metabolic acidosis: Secondary to acute on chronic kidney disease. Resolved. 7. Secondary hyperparathyroidism and hyperphosphatemia: Continue calcitriol and Renagel. 8. Uncontrolled hypertension: Her medications were gradually titrated. There reasonably controlled at this time. Hydralazine was discontinued. Labetalol was started and amlodipine was continued. 9. GERD: Pepcid dose was reduced to adjust her renal function.  10. Acute respiratory distress secondary to acidosis, pleural effusions, abdominal distention and kyphosis: Patient was briefly on BiPAP which she did not tolerate very well. She was diuresed with good effect. Xanax was resumed. Her respiratory status is stable. 11. Hyponatremia: Resolved. 12. Anemia/thrombocytopenia/leukopenia: Anemia secondary to chronic kidney disease. Stable. Thrombocytopenia and leukopenia resolved. 13. History of bipolar disorder : Continue Trileptal, risperidone and Xanax. Stable. Patient is on chronic benzodiazepines which should not be stopped abruptly. 5 day supply prescription has been given which will have to be refilled at SNF before there run out. 14. Hypothyroidism: Patient's Synthroid dose was reduced last admission. Not sure if she has central euthyroid/sick euthyroid. May benefit from OP endocrinology consultation and repeat TFTs in for-6 weeks. Synthroid dose was increased from 25-50 mcg daily. 15. Acute diastolic CHF/fluid overload: Precipitated by IV fluids and uncontrolled blood pressure. Treated with Lasix and improving.  2-D echo technically difficult study. LVEF 60-65%. Grade 1 diastolic dysfunction. Mild aortic stenosis.  Procedures:  Left IJ central line  Foley catheter   Consultations:  Pulmonary and critical care medicine  Nephrology  Discharge Exam:  Complaints:  Chronic back pain. Had normal BM yesterday. Denies dyspnea or chest pain. Denies abdominal pain, nausea or vomiting. Concerned about increased frequency of urination from Lasix and was wondering if the dose could be reduced (done).   Filed Vitals:   11/16/12 1300 11/16/12 2156 11/17/12 0517 11/17/12 1055  BP: 140/55 146/56 152/57 161/70  Pulse: 80 74 63   Temp: 98.7 F (37.1 C) 98 F (36.7 C) 97.6 F (36.4 C)   TempSrc: Oral Oral Oral   Resp: 20 18 18    Height:      Weight:      SpO2: 98% 96% 97%      General exam: Comfortable.   Respiratory system: Reduced breath sounds in the bases. Rest of lung fields clear to auscultation. No increased work of breathing.   Cardiovascular system: S1 & S2 heard, RRR. No JVD, murmurs, gallops, clicks has trace bilateral leg edema.   Gastrointestinal system: Abdomen is mildly distended but soft and nontender. Normal bowel sounds heard.   Central nervous system: Alert and oriented x3. No focal neurological deficits.   Extremities: Symmetric 5 x 5 power.   Musculoskeletal system: Significant kyphosis. No acute signs.   Discharge Instructions      Discharge Orders   Future Orders Complete By Expires     (HEART FAILURE PATIENTS) Call MD:  Anytime you have any of the following symptoms: 1) 3 pound weight gain in 24 hours or 5 pounds in 1  week 2) shortness of breath, with or without a dry hacking cough 3) swelling in the hands, feet or stomach 4) if you have to sleep on extra pillows at night in order to breathe.  As directed     Call MD for:  difficulty breathing, headache or visual disturbances  As directed     Call MD for:  extreme fatigue  As directed     Call MD for:   persistant dizziness or light-headedness  As directed     Call MD for:  persistant nausea and vomiting  As directed     Call MD for:  severe uncontrolled pain  As directed     Call MD for:  temperature >100.4  As directed     Discharge instructions  As directed     Comments:      DIET: Renal diet.    Increase activity slowly  As directed         Medication List    STOP taking these medications       fosfomycin 3 G Pack  Commonly known as:  MONUROL     hydrALAZINE 25 MG tablet  Commonly known as:  APRESOLINE     meclizine 25 MG tablet  Commonly known as:  ANTIVERT      TAKE these medications       acetaminophen 325 MG tablet  Commonly known as:  TYLENOL  Take 2 tablets (650 mg total) by mouth every 6 (six) hours as needed for pain or fever.     ALPRAZolam 1 MG tablet  Commonly known as:  XANAX  Take 1 tablet (1 mg total) by mouth 3 (three) times daily.     amLODipine 10 MG tablet  Commonly known as:  NORVASC  Take 10 mg by mouth at bedtime.     calcitRIOL 0.5 MCG capsule  Commonly known as:  ROCALTROL  Take 1 capsule (0.5 mcg total) by mouth daily.     cyclobenzaprine 5 MG tablet  Commonly known as:  FLEXERIL  Take 5 mg by mouth 3 (three) times daily as needed for muscle spasms.     docusate sodium 100 MG capsule  Commonly known as:  COLACE  Take 200-300 mg by mouth 2 (two) times daily. 3 caps in am, 2 caps in pm     famotidine 20 MG tablet  Commonly known as:  PEPCID  Take 1 tablet (20 mg total) by mouth daily.     fluconazole 50 MG tablet  Commonly known as:  DIFLUCAN  Take 1 tablet (50 mg total) by mouth daily. Discontinue after 11/21/2012 doses.     furosemide 40 MG tablet  Commonly known as:  LASIX  Take 1 tablet (40 mg total) by mouth daily.     guaifenesin 100 MG/5ML syrup  Commonly known as:  ROBITUSSIN  Take 200 mg by mouth 4 (four) times daily as needed for cough.     labetalol 300 MG tablet  Commonly known as:  NORMODYNE  Take 1 tablet  (300 mg total) by mouth 2 (two) times daily.     lamoTRIgine 200 MG tablet  Commonly known as:  LAMICTAL  Take 200 mg by mouth 2 (two) times daily.     levothyroxine 50 MCG tablet  Commonly known as:  SYNTHROID, LEVOTHROID  Take 1 tablet (50 mcg total) by mouth daily before breakfast.     lubiprostone 24 MCG capsule  Commonly known as:  AMITIZA  Take 24 mcg by mouth 2 (  two) times daily with a meal.     magnesium hydroxide 400 MG/5ML suspension  Commonly known as:  MILK OF MAGNESIA  Take 30-60 mLs by mouth daily as needed for constipation.     omega-3 acid ethyl esters 1 G capsule  Commonly known as:  LOVAZA  Take 4 g by mouth daily.     Oxcarbazepine 300 MG tablet  Commonly known as:  TRILEPTAL  Take 900 mg by mouth 2 (two) times daily.     risperiDONE 2 MG tablet  Commonly known as:  RISPERDAL  Take 4 mg by mouth 2 (two) times daily.     sevelamer carbonate 800 MG tablet  Commonly known as:  RENVELA  Take 1 tablet (800 mg total) by mouth 3 (three) times daily with meals.     sodium phosphate enema  Commonly known as:  FLEET  Place 1 enema rectally as needed (for constipation, may self-administer.).     traMADol 50 MG tablet  Commonly known as:  ULTRAM  Take 50 mg by mouth 2 (two) times daily.       Follow-up Information   Schedule an appointment as soon as possible for a visit with Pamelia Hoit, MD.   Contact information:   [address] [place] [address] [phone]       Follow up with [facility] at [facility]. Schedule an appointment as soon as possible for a visit in 5 days. (To be seen with repeat labs (Renal Panel & CBC))       Follow up with Lauris Poag, MD. (MD's office will call with appointment to be seen in 4-6 weeks. Please call if you don't hear from them in the next 3-4 days.)    Contact information:   [address] [facility] [place] [address] [phone]        The results of significant  diagnostics from this hospitalization (including imaging, microbiology, ancillary and laboratory) are listed below for reference.    Significant Diagnostic Studies: Ct Abdomen Pelvis Wo Contrast  11/10/2012   *RADIOLOGY REPORT*  Clinical Data: Colonic distension, pain, history of diverticulosis  CT ABDOMEN AND PELVIS WITHOUT CONTRAST  Technique:  Multidetector CT imaging of the abdomen and pelvis was performed following the standard protocol without intravenous contrast.  Comparison: 05/17/2011  Findings: There are small to moderate bilateral pleural effusions. There is bilateral posterior lung base consolidation most consistent with passive atelectasis.  In the absence of contrast, there is limited evaluation of the solid and hollow viscera.  Allowing for this, liver and spleen appear normal. Allowing for limited evaluation in the absence of IV contrast, there does appear to be mildly increased attenuation in the retroperitoneal fat surrounding the pancreas.  The pancreas is otherwise normal.  There are no adrenal gland masses.  The gallbladder is surgically absent.  The bilateral kidneys show cortical atrophy with multi focal fine calcification.  This is stable in appearance when compared to the prior study.  There is bilateral perinephric inflammatory change, symmetric and chronic, likely not of acute clinical significance.  The there is minimal ascites in the abdomen and pelvis.  The bladder is decompressed by Foley catheter and therefore not evaluated.  Pelvic organs show no evidence of masses.  There is diverticulosis of the sigmoid colon.  There is also diverticulosis involving the descending colon.  Diverticulosis is mild to moderate.  There is no evidence of diverticulitis.  There are no abnormally dilated loops of bowel. Liquid stool is  seen throughout the colon as it was previously.  Again, this is an abnormal but nonspecific finding that can be seen with diarrhea type illness. Oral contrast was  administered, and is seen within the stomach, small bowel, and cecum but has not reached the distal colon at the time of imaging.  There are no acute musculoskeletal findings.  There is diffuse osteosclerosis which may be due to chronic renal insufficiency.  IMPRESSION:  1.  Bilateral pleural effusions with passive atelectasis 2.  Nonobstructive bowel gas pattern.  Liquid stool throughout the colon.  Distal colonic diverticulosis without evidence of diverticulitis.  3.  Evidence of chronic renal disease  4.  Questionable findings suggesting the possibility of mild acute pancreatitis.   Original Report Authenticated By: Esperanza Heir, M.D.   Dg Chest 1 View  11/14/2012   *RADIOLOGY REPORT*  Clinical Data: Edema.  Pleural effusions.  CHEST - 1 VIEW  Comparison: Portable chest of 11/12/2012 and CT abdomen and pelvis 11/10/2012.  Findings: Left IJ catheter has been removed.  Hazy bibasilar opacities persist compatible with small pleural effusions.  There is cardiomegaly and vascular congestion.  No consolidative process is identified.  No pneumothorax.  IMPRESSION: No marked change in small bilateral pleural effusions.  Pulmonary vascular congestion again seen.   Original Report Authenticated By: Holley Dexter, M.D.   Dg Chest 2 View  11/09/2012   *RADIOLOGY REPORT*  Clinical Data: Altered mental status, hypotension  CHEST - 2 VIEW  Comparison: 06/11/2012  Findings: Right upper lobe is obscured by the patient's chin.  Chronic interstitial markings.  No focal consolidation is seen.  No pleural effusion or pneumothorax.  Cardiomegaly.  Degenerative changes of the visualized thoracolumbar spine with exaggerated thoracic kyphosis.  IMPRESSION: Right upper lobe is obscured by the patient's chin.  No evidence of acute cardiopulmonary disease.   Original Report Authenticated By: Charline Bills, M.D.   Ct Head Wo Contrast  11/09/2012   *RADIOLOGY REPORT*  Clinical Data: Altered mental status.  CT HEAD WITHOUT  CONTRAST  Technique:  Contiguous axial images were obtained from the base of the skull through the vertex without contrast.  Comparison: 10/21/2012  Findings: Stable ventriculomegaly.  There is mild atrophy.  Normal pressure hydrocephalus cannot be completely excluded as mentioned on prior study.  No hemorrhage.  No acute infarction.  No extra- axial fluid collection.  No acute calvarial abnormality. Visualized paranasal sinuses and mastoids clear.  Orbital soft tissues unremarkable.  IMPRESSION: Stable ventriculomegaly which may be related to mild atrophy although normal pressure hydrocephalus cannot be excluded.  No acute findings.   Original Report Authenticated By: Charlett Nose, M.D.   Dg Chest Port 1 View  11/12/2012   *RADIOLOGY REPORT*  Clinical Data: Follow-up pleural effusions.  PORTABLE CHEST - 1 VIEW  Comparison: 11/09/2012  Findings: Left central venous catheter with tip over the mid SVC region.  Cardiac enlargement with mild pulmonary vascular congestion.  No definite edema.  Small bilateral pleural effusions with minimal basilar atelectasis.  No focal consolidation.  No pneumothorax.  Calcified aorta.  IMPRESSION: Small bilateral pleural effusions with basilar atelectasis. Cardiac enlargement with pulmonary vascular congestion.   Original Report Authenticated By: Burman Nieves, M.D.   Dg Chest Port 1 View  11/09/2012   *RADIOLOGY REPORT*  Clinical Data: Post central line placement.  PORTABLE CHEST - 1 VIEW  Comparison: 11/09/2012.  Findings: Study is suboptimal due to severe kyphosis and difficulty in positioning.  Left central line tip appears to be in the upper SVC.  No visible pneumothorax.  IMPRESSION: Left central line tip in the upper SVC.  No pneumothorax.  Study otherwise limited due to patient positioning.   Original Report Authenticated By: Charlett Nose, M.D.   Dg Abd 2 Views  11/15/2012   *RADIOLOGY REPORT*  Clinical Data: Abdominal pain and distention  ABDOMEN - 2 VIEW  Comparison:  11/10/2012  Findings: Improving colonic dilatation compared with the prior study.  There is distention of the stomach.  There is moderate stool in the right colon.  Colon is not dilated and there is no evidence of small bowel obstruction.  No free air.  Surgical clips in the gallbladder fossa.  IMPRESSION: Resolving ileus.  Moderate retained stool in the right colon.   Original Report Authenticated By: Janeece Riggers, M.D.   Dg Abd Portable 2v  11/10/2012   *RADIOLOGY REPORT*  Clinical Data: Abdominal pain and distention  PORTABLE ABDOMEN - 2 VIEW  Comparison: 01/03/2012  Findings: Moderate to marked gaseous distention of the colon is identified.  No abnormal small bowel dilatation identified.  No significant air fluid levels identified on the decubitus radiograph.  There is no free air identified.  IMPRESSION:  1.  Moderate gaseous distention of the colon which may be secondary to ileus. No evidence for high-grade small bowel obstruction.   Original Report Authenticated By: Signa Kell, M.D.    Microbiology: Recent Results (from the past 240 hour(s))  URINE CULTURE     Status: None   Collection Time    11/09/12  4:22 PM      Result Value Range Status   Specimen Description URINE, CATHETERIZED   Final   Special Requests NONE   Final   Culture  Setup Time 11/09/2012 23:14   Final   Colony Count >=100,000 COLONIES/ML   Final   Culture YEAST   Final   Report Status 11/10/2012 FINAL   Final  CULTURE, BLOOD (ROUTINE X 2)     Status: None   Collection Time    11/09/12  4:50 PM      Result Value Range Status   Specimen Description BLOOD LEFT HAND   Final   Special Requests BOTTLES DRAWN AEROBIC AND ANAEROBIC 4.5CC   Final   Culture  Setup Time 11/09/2012 21:38   Final   Culture NO GROWTH 5 DAYS   Final   Report Status 11/15/2012 FINAL   Final  CULTURE, BLOOD (ROUTINE X 2)     Status: None   Collection Time    11/09/12  4:58 PM      Result Value Range Status   Specimen Description BLOOD LEFT  HAND   Final   Special Requests BOTTLES DRAWN AEROBIC AND ANAEROBIC 5CC   Final   Culture  Setup Time 11/09/2012 21:38   Final   Culture NO GROWTH 5 DAYS   Final   Report Status 11/15/2012 FINAL   Final  MRSA PCR SCREENING     Status: None   Collection Time    11/09/12  8:43 PM      Result Value Range Status   MRSA by PCR NEGATIVE  NEGATIVE Final   Comment:            The GeneXpert MRSA Assay (FDA     approved for NASAL specimens     only), is one component of a     comprehensive MRSA colonization     surveillance program. It is not     intended to diagnose MRSA     infection  nor to guide or     monitor treatment for     MRSA infections.  CLOSTRIDIUM DIFFICILE BY PCR     Status: None   Collection Time    11/10/12 11:57 AM      Result Value Range Status   C difficile by pcr NEGATIVE  NEGATIVE Final     Labs: Basic Metabolic Panel:  Recent Labs Lab 11/13/12 0509 11/14/12 0445 11/15/12 0523 11/16/12 0428 11/17/12 0450  NA 142 143 139 139 140  K 4.4 4.0 3.9 4.4 4.8  CL 104 105 102 102 104  CO2 26 26 26 26 24   GLUCOSE 98 98 111* 92 99  BUN 63* 66* 69* 69* 73*  CREATININE 3.57* 3.62* 3.65* 3.29* 3.39*  CALCIUM 8.4 9.1 8.8 8.8 9.2  PHOS 7.4* 7.7* 8.0* 7.9* 7.4*   Liver Function Tests:  Recent Labs Lab 11/11/12 0410  11/12/12 0600  11/14/12 0445 11/15/12 0523 11/15/12 0800 11/16/12 0428 11/17/12 0450  AST 18  --  17  --   --   --  15  --   --   ALT 27  --  23  --   --   --  15  --   --   ALKPHOS 175*  --  181*  --   --   --  164*  --   --   BILITOT 0.1*  --  0.1*  --   --   --  0.2*  --   --   PROT 4.9*  --  5.3*  --   --   --  5.5*  --   --   ALBUMIN 2.2*  < > 2.3*  < > 2.5* 2.4* 2.4* 2.6* 2.5*  < > = values in this interval not displayed.  Recent Labs Lab 11/11/12 0410 11/12/12 0600 11/15/12 0523  LIPASE 248* 247* 429*   No results found for this basename: AMMONIA,  in the last 168 hours CBC:  Recent Labs Lab 11/11/12 0410 11/12/12 0600  11/13/12 0509 11/14/12 0445  WBC 3.3* 3.7* 4.6 5.8  HGB 8.0* 8.5* 8.5* 9.2*  HCT 24.1* 24.7* 25.6* 27.8*  MCV 96.4 93.6 94.8 95.5  PLT 139* 159 167 167   Cardiac Enzymes:  Recent Labs Lab 11/10/12 2000  TROPONINI <0.30   BNP: BNP (last 3 results) No results found for this basename: PROBNP,  in the last 8760 hours CBG: No results found for this basename: GLUCAP,  in the last 168 hours  Additional labs:  ABG on 11/11/12: PH 7.255, PCO2 38, PO2 90, bicarbonate 16.4 and oxygen saturation 94.5%   Triglycerides: 63   Plasma cortisol: 23.7    PTH: 1469.2  TSH: 0.618, free T4: 0.37 and total T3: 16.9  C. difficile PCR: Negative   Signed:  Wilhemenia Camba  [facility] 11/17/2012, 1:01 PM

## 2012-11-17 NOTE — Progress Notes (Addendum)
Patient is set to discharge to [facility] today. Patient & sister aware. Discharge packet in [place]. [facility] transport scheduled for 3:15pm pickup (Service Request Id: [id number]).  Clinical Social Work Department CLINICAL SOCIAL WORK PLACEMENT NOTE 11/17/2012  Patient:  [name], [name]  Account Number:  [id number] Admit date:  11/09/2012  Clinical Social Worker:  Orpah Greek  Date/time:  11/14/2012 12:08 PM  Clinical Social Work is seeking post-discharge placement for this patient at the following level of care:   SKILLED NURSING   (*CSW will update this form in Epic as items are completed)   11/14/2012  Patient/family provided with [facility] Department of Clinical Social Work's list of facilities offering this level of care within the geographic area requested by the patient (or if unable, by the patient's family).  11/14/2012  Patient/family informed of their freedom to choose among providers that offer the needed level of care, that participate in Medicare, Medicaid or managed care program needed by the patient, have an available bed and are willing to accept the patient.  11/14/2012  Patient/family informed of [facility]' ownership interest in [facility], as well as of the fact that they are under no obligation to receive care at this facility.  PASARR submitted to EDS on 11/14/2012 PASARR number received from EDS on 11/14/2012  FL2 transmitted to all facilities in geographic area requested by pt/family on  11/14/2012 FL2 transmitted to all facilities within larger geographic area on   Patient informed that his/her managed care company has contracts with or will negotiate with  certain facilities, including the following:     Patient/family informed of bed offers received:  11/14/2012 Patient chooses bed at [facility] Physician recommends and patient chooses bed at    Patient to be transferred to [facility] on   11/17/2012 Patient to be transferred to facility by [facility]  The following physician request were entered in Epic:   Additional Comments:   Unice Bailey, LCSW [facility] Clinical Social Worker cell #: [phone]

## 2012-12-22 ENCOUNTER — Other Ambulatory Visit (HOSPITAL_COMMUNITY): Payer: Self-pay | Admitting: *Deleted

## 2012-12-23 ENCOUNTER — Ambulatory Visit (HOSPITAL_COMMUNITY)
Admission: RE | Admit: 2012-12-23 | Discharge: 2012-12-23 | Disposition: A | Discharge status: Home / Self Care | Payer: Medicare Other | Source: Ambulatory Visit | Attending: Nephrology | Admitting: Nephrology

## 2012-12-23 DIAGNOSIS — N189 Chronic kidney disease, unspecified: Secondary | ICD-10-CM | POA: Insufficient documentation

## 2012-12-23 DIAGNOSIS — D649 Anemia, unspecified: Secondary | ICD-10-CM | POA: Insufficient documentation

## 2012-12-23 DIAGNOSIS — I129 Hypertensive chronic kidney disease with stage 1 through stage 4 chronic kidney disease, or unspecified chronic kidney disease: Secondary | ICD-10-CM | POA: Insufficient documentation

## 2012-12-23 MED ORDER — ACETAMINOPHEN 325 MG PO TABS
ORAL_TABLET | ORAL | Status: AC
  Filled 2012-12-23: qty 2

## 2012-12-23 MED ORDER — DIPHENHYDRAMINE HCL 25 MG PO CAPS
ORAL_CAPSULE | ORAL | Status: AC
  Filled 2012-12-23: qty 1

## 2012-12-23 MED ORDER — DIPHENHYDRAMINE HCL 25 MG PO CAPS
25.0000 mg | ORAL_CAPSULE | Freq: Once | ORAL | Status: AC
  Administered 2012-12-23: 25 mg via ORAL

## 2012-12-23 MED ORDER — ACETAMINOPHEN 325 MG PO TABS
650.0000 mg | ORAL_TABLET | Freq: Once | ORAL | Status: AC
  Administered 2012-12-23: 650 mg via ORAL

## 2012-12-24 LAB — TYPE AND SCREEN: Unit division: 0

## 2012-12-29 ENCOUNTER — Encounter: Payer: Self-pay | Admitting: Internal Medicine

## 2013-03-25 ENCOUNTER — Other Ambulatory Visit: Payer: Self-pay | Admitting: Vascular Surgery

## 2013-03-25 DIAGNOSIS — Z01810 Encounter for preprocedural cardiovascular examination: Secondary | ICD-10-CM

## 2013-03-25 DIAGNOSIS — N184 Chronic kidney disease, stage 4 (severe): Secondary | ICD-10-CM

## 2013-03-26 ENCOUNTER — Encounter: Payer: Self-pay | Admitting: Vascular Surgery

## 2013-04-20 ENCOUNTER — Encounter: Payer: Self-pay | Admitting: Vascular Surgery

## 2013-04-21 ENCOUNTER — Encounter: Payer: Self-pay | Admitting: Vascular Surgery

## 2013-04-21 ENCOUNTER — Ambulatory Visit (INDEPENDENT_AMBULATORY_CARE_PROVIDER_SITE_OTHER)
Admission: RE | Admit: 2013-04-21 | Discharge: 2013-04-21 | Disposition: A | Discharge status: Home / Self Care | Payer: Medicare Other | Source: Ambulatory Visit | Attending: Vascular Surgery | Admitting: Vascular Surgery

## 2013-04-21 ENCOUNTER — Ambulatory Visit (INDEPENDENT_AMBULATORY_CARE_PROVIDER_SITE_OTHER): Payer: Medicare Other | Admitting: Vascular Surgery

## 2013-04-21 ENCOUNTER — Ambulatory Visit (HOSPITAL_COMMUNITY)
Admission: RE | Admit: 2013-04-21 | Discharge: 2013-04-21 | Disposition: A | Discharge status: Home / Self Care | Payer: Medicare Other | Source: Ambulatory Visit | Attending: Vascular Surgery | Admitting: Vascular Surgery

## 2013-04-21 VITALS — BP 166/63 | HR 51 | Ht 66.0 in | Wt 148.2 lb

## 2013-04-21 DIAGNOSIS — Z01810 Encounter for preprocedural cardiovascular examination: Secondary | ICD-10-CM

## 2013-04-21 DIAGNOSIS — N186 End stage renal disease: Secondary | ICD-10-CM

## 2013-04-21 DIAGNOSIS — N184 Chronic kidney disease, stage 4 (severe): Secondary | ICD-10-CM

## 2013-04-21 NOTE — Progress Notes (Signed)
Patient name: [name] MRN: [id number] DOB: Nov 05, 1945 Sex: female   Referred by: Kathrene Bongo  Reason for referral:  Chief Complaint  Patient presents with  . New Evaluation    eval for access placement    HISTORY OF PRESENT ILLNESS: The patient presents today for evaluation of hemodialysis access. He is in chronic nursing facility. I have not noticed review from the nephrology service. He is felt to be stage IV kidney disease she is felt to be a poor candidate for dialysis but is here today for discussion of access placement. She did undergo vein mapping upper trimming arterial evaluation determine if she is a candidate for fistula creation.  Past Medical History  Diagnosis Date  . Constipation   . Bipolar 1 disorder   . Hypertension   . Esophageal reflux   . Anxiety   . Back pain   . Osteoarthritis   . Gout   . Adenomatous polyps 03-27-07  . Diverticulosis   . Internal hemorrhoids   . Hypothyroidism   . Renal insufficiency   . Atrial fibrillation     Past Surgical History  Procedure Laterality Date  . Cholecystectomy    . Appendectomy    . Tonsillectomy    . Tubal ligation      History   Social History  . Marital Status: Married    Spouse Name: N/A    Number of Children: 0  . Years of Education: N/A   Occupational History  . disabled    Social History Main Topics  . Smoking status: Former Smoker    Quit date: 05/08/2003  . Smokeless tobacco: Never Used  . Alcohol Use: No  . Drug Use: No  . Sexual Activity: No   Other Topics Concern  . Not on file   Social History Narrative  . No narrative on file    Family History  Problem Relation Age of Onset  . Hypertension Mother   . Heart disease Mother     before age 29  . Hyperlipidemia Mother   . Atrial fibrillation Father   . Colon cancer Neg Hx     Allergies as of 04/21/2013  . (No Known Allergies)    Current Outpatient Prescriptions on File Prior to Visit  Medication Sig  Dispense Refill  . acetaminophen (TYLENOL) 325 MG tablet Take 2 tablets (650 mg total) by mouth every 6 (six) hours as needed for pain or fever.      . ALPRAZolam (XANAX) 1 MG tablet Take 1 tablet (1 mg total) by mouth 3 (three) times daily.  15 tablet  0  . amLODipine (NORVASC) 10 MG tablet Take 10 mg by mouth at bedtime.       . calcitRIOL (ROCALTROL) 0.5 MCG capsule Take 1 capsule (0.5 mcg total) by mouth daily.  30 capsule  0  . cyclobenzaprine (FLEXERIL) 5 MG tablet Take 5 mg by mouth 3 (three) times daily as needed for muscle spasms.       Marland Kitchen docusate sodium (COLACE) 100 MG capsule Take 200-300 mg by mouth 2 (two) times daily. 3 caps in am, 2 caps in pm      . famotidine (PEPCID) 20 MG tablet Take 1 tablet (20 mg total) by mouth daily.  30 tablet  0  . fluconazole (DIFLUCAN) 50 MG tablet Take 1 tablet (50 mg total) by mouth daily. Discontinue after 11/21/2012 doses.  4 tablet  0  . furosemide (LASIX) 40 MG tablet Take 1 tablet (40  mg total) by mouth daily.  30 tablet  0  . guaifenesin (ROBITUSSIN) 100 MG/5ML syrup Take 200 mg by mouth 4 (four) times daily as needed for cough.       . labetalol (NORMODYNE) 300 MG tablet Take 1 tablet (300 mg total) by mouth 2 (two) times daily.  60 tablet  0  . lamoTRIgine (LAMICTAL) 200 MG tablet Take 200 mg by mouth 2 (two) times daily.       Marland Kitchen levothyroxine (SYNTHROID, LEVOTHROID) 50 MCG tablet Take 1 tablet (50 mcg total) by mouth daily before breakfast.  30 tablet  0  . lubiprostone (AMITIZA) 24 MCG capsule Take 24 mcg by mouth 2 (two) times daily with a meal.      . magnesium hydroxide (MILK OF MAGNESIA) 400 MG/5ML suspension Take 30-60 mLs by mouth daily as needed for constipation.      Marland Kitchen omega-3 acid ethyl esters (LOVAZA) 1 G capsule Take 4 g by mouth daily.       . Oxcarbazepine (TRILEPTAL) 300 MG tablet Take 900 mg by mouth 2 (two) times daily.       . risperiDONE (RISPERDAL) 2 MG tablet Take 4 mg by mouth 2 (two) times daily.       . sevelamer  carbonate (RENVELA) 800 MG tablet Take 1 tablet (800 mg total) by mouth 3 (three) times daily with meals.  90 tablet  0  . sodium phosphate (FLEET) enema Place 1 enema rectally as needed (for constipation, may self-administer.).       Marland Kitchen traMADol (ULTRAM) 50 MG tablet Take 50 mg by mouth 2 (two) times daily.       No current facility-administered medications on file prior to visit.     REVIEW OF SYSTEMS:  Positives indicated with an "X"  CARDIOVASCULAR:  [ ]  chest pain   [ ]  chest pressure   [ ]  palpitations   [ ]  orthopnea   [ ]  dyspnea on exertion   [ ]  claudication   [ ]  rest pain   [ ]  DVT   [ ]  phlebitis PULMONARY:   [ ]  productive cough   [ ]  asthma   [ ]  wheezing NEUROLOGIC:   [x ] weakness  [ ]  paresthesias  [ ]  aphasia  [ ]  amaurosis  x[ ]  dizziness HEMATOLOGIC:   [ ]  bleeding problems   [ ]  clotting disorders MUSCULOSKELETAL:  [ ]  joint pain   [ ]  joint swelling GASTROINTESTINAL: [ ]   blood in stool  [ ]   hematemesis GENITOURINARY:  [ ]   dysuria  [ ]   hematuria PSYCHIATRIC:  [x ] history of major depression INTEGUMENTARY:  [ ]  rashes  [ ]  ulcers CONSTITUTIONAL:  [ ]  fever   [ ]  chills  PHYSICAL EXAMINATION:  General: The patient is a well-nourished female, in no acute distress. Vital signs are BP 166/63  Pulse 51  Ht 5\' 6"  (1.676 m)  Wt 148 lb 3.2 oz (67.223 kg)  BMI 23.93 kg/m2  SpO2 100% Pulmonary: There is a good air exchange bilaterally   Musculoskeletal: There are no major deformities.  Extensive kyphosis Neurologic: No focal weakness or paresthesias are detected, Skin: There are no ulcer or rashes noted. Psychiatric: The patient has normal affect. Cardiovascular: 2+ radial pulses bilaterally. Very small surface veins   [facility] Vascular Lab Studies:  Ordered and Independently Reviewed small and nonvisualized cephalic veins bilaterally.  Impression and Plan:  Long discussion with the patient regarding access for hemodialysis. I explained catheter  for  hemodialysis, AV fistula an AV graft. She clearly is not a candidate for AV fistula creation. I would recommend an AV graft when she comes to the point of needing hemodialysis. She understood this will see [place] on an as-needed basis    EARLY, TODD [facility] Office: [phone]

## 2013-06-13 ENCOUNTER — Encounter (HOSPITAL_COMMUNITY): Payer: Self-pay | Admitting: Emergency Medicine

## 2013-06-13 ENCOUNTER — Emergency Department (HOSPITAL_COMMUNITY)
Admission: EM | Admit: 2013-06-13 | Discharge: 2013-06-13 | Disposition: A | Discharge status: Home / Self Care | Payer: Medicare Other | Attending: Emergency Medicine | Admitting: Emergency Medicine

## 2013-06-13 ENCOUNTER — Emergency Department (HOSPITAL_COMMUNITY): Payer: Medicare Other

## 2013-06-13 DIAGNOSIS — R109 Unspecified abdominal pain: Secondary | ICD-10-CM

## 2013-06-13 DIAGNOSIS — F319 Bipolar disorder, unspecified: Secondary | ICD-10-CM | POA: Insufficient documentation

## 2013-06-13 DIAGNOSIS — N390 Urinary tract infection, site not specified: Secondary | ICD-10-CM

## 2013-06-13 DIAGNOSIS — Z79899 Other long term (current) drug therapy: Secondary | ICD-10-CM | POA: Insufficient documentation

## 2013-06-13 DIAGNOSIS — Z9851 Tubal ligation status: Secondary | ICD-10-CM | POA: Insufficient documentation

## 2013-06-13 DIAGNOSIS — E039 Hypothyroidism, unspecified: Secondary | ICD-10-CM | POA: Insufficient documentation

## 2013-06-13 DIAGNOSIS — K5900 Constipation, unspecified: Secondary | ICD-10-CM | POA: Insufficient documentation

## 2013-06-13 DIAGNOSIS — Z9089 Acquired absence of other organs: Secondary | ICD-10-CM | POA: Insufficient documentation

## 2013-06-13 DIAGNOSIS — R011 Cardiac murmur, unspecified: Secondary | ICD-10-CM | POA: Insufficient documentation

## 2013-06-13 DIAGNOSIS — Z87891 Personal history of nicotine dependence: Secondary | ICD-10-CM | POA: Insufficient documentation

## 2013-06-13 DIAGNOSIS — I10 Essential (primary) hypertension: Secondary | ICD-10-CM | POA: Insufficient documentation

## 2013-06-13 DIAGNOSIS — Z8739 Personal history of other diseases of the musculoskeletal system and connective tissue: Secondary | ICD-10-CM | POA: Insufficient documentation

## 2013-06-13 DIAGNOSIS — F411 Generalized anxiety disorder: Secondary | ICD-10-CM | POA: Insufficient documentation

## 2013-06-13 DIAGNOSIS — K219 Gastro-esophageal reflux disease without esophagitis: Secondary | ICD-10-CM | POA: Insufficient documentation

## 2013-06-13 LAB — CBC WITH DIFFERENTIAL/PLATELET
Basophils Absolute: 0 10*3/uL (ref 0.0–0.1)
Basophils Relative: 0 % (ref 0–1)
EOS ABS: 0.1 10*3/uL (ref 0.0–0.7)
EOS PCT: 1 % (ref 0–5)
HCT: 28.4 % — ABNORMAL LOW (ref 36.0–46.0)
Hemoglobin: 9.4 g/dL — ABNORMAL LOW (ref 12.0–15.0)
LYMPHS ABS: 0.6 10*3/uL — AB (ref 0.7–4.0)
Lymphocytes Relative: 7 % — ABNORMAL LOW (ref 12–46)
MCH: 33.1 pg (ref 26.0–34.0)
MCHC: 33.1 g/dL (ref 30.0–36.0)
MCV: 100 fL (ref 78.0–100.0)
MONO ABS: 0.4 10*3/uL (ref 0.1–1.0)
Monocytes Relative: 5 % (ref 3–12)
Neutro Abs: 7.6 10*3/uL (ref 1.7–7.7)
Neutrophils Relative %: 87 % — ABNORMAL HIGH (ref 43–77)
PLATELETS: 254 10*3/uL (ref 150–400)
RBC: 2.84 MIL/uL — AB (ref 3.87–5.11)
RDW: 16.9 % — AB (ref 11.5–15.5)
WBC: 8.7 10*3/uL (ref 4.0–10.5)

## 2013-06-13 LAB — URINALYSIS, ROUTINE W REFLEX MICROSCOPIC
Bilirubin Urine: NEGATIVE
Glucose, UA: NEGATIVE mg/dL
Ketones, ur: NEGATIVE mg/dL
NITRITE: POSITIVE — AB
Protein, ur: 30 mg/dL — AB
SPECIFIC GRAVITY, URINE: 1.012 (ref 1.005–1.030)
UROBILINOGEN UA: 0.2 mg/dL (ref 0.0–1.0)
pH: 6 (ref 5.0–8.0)

## 2013-06-13 LAB — URINE MICROSCOPIC-ADD ON

## 2013-06-13 LAB — CG4 I-STAT (LACTIC ACID): LACTIC ACID, VENOUS: 0.31 mmol/L — AB (ref 0.5–2.2)

## 2013-06-13 MED ORDER — CEPHALEXIN 500 MG PO CAPS: 500.0000 mg | ORAL_CAPSULE | Freq: Two times a day (BID) | ORAL | Status: DC

## 2013-06-13 MED ORDER — LEVOFLOXACIN 750 MG PO TABS: 750.0000 mg | ORAL_TABLET | Freq: Once | ORAL | Status: DC

## 2013-06-13 MED ORDER — CEPHALEXIN 250 MG PO CAPS
500.0000 mg | ORAL_CAPSULE | Freq: Once | ORAL | Status: AC
  Administered 2013-06-13: 500 mg via ORAL
  Filled 2013-06-13: qty 2

## 2013-06-13 NOTE — ED Notes (Signed)
Dr. Sabra Heck orders in and out cath in efforts to empty the bladder.

## 2013-06-13 NOTE — ED Notes (Addendum)
Per EMS, facility called for complaints of right flank pain around 5th rib.  Tender to palpation.  She fell earlier today, but she did not hit anything, not sure where this pain came from.  Shallow breaths, o2saturations 89 on room air, 95 on 2L.  From [facility], [place]. Phone: [phone].  BP 150/80 pulse 76, RR 17 and shallow. She is alert and oriented, with history of bipolar, anxiety.

## 2013-06-13 NOTE — ED Notes (Signed)
Patient transported to X-ray 

## 2013-06-13 NOTE — ED Notes (Deleted)
Per EMS, urine has been dark for the past week, with trouble making urine.  Less than 100 mL for one whole day, even though patient takes lasix.  No problems breathing, no chest pain, history of CHF, lung sounds clear. BP 131/70    Pulse 88. O2 sat 94 cbg 131 rr 22. Patient came from home. Temp 98.1 laser temporal.  At times, patient has appeared confused, took approximately 34minutes for the patient to become oriented to surroundings.  Patient was alert and oriented during transport.

## 2013-06-13 NOTE — ED Notes (Signed)
Patient already moved to the bedside commode with tech.  Urine sample collected and sent then.

## 2013-06-13 NOTE — ED Notes (Signed)
[facility] has been called for patient. Pt is dressed and ready for transport back to [facility].

## 2013-06-13 NOTE — Discharge Instructions (Signed)
Your testing has shown that you have a UTI, this is treated with antibiotics - Take Keflex twice daily for the next 7 days - your xray also showed a small early possible pneumonia - if you should develop increased difficulty breathing, or high fevers / vomiting you should return to the ER immediately.  Please call your doctor for a followup appointment within 24-48 hours. When you talk to your doctor please let them know that you were seen in the emergency department and have them acquire all of your records so that they can discuss the findings with you and formulate a treatment plan to fully care for your new and ongoing problems.

## 2013-06-13 NOTE — ED Provider Notes (Signed)
CSN: [id number]     Arrival date & time 06/13/13  0227 History   First MD Initiated Contact with Patient 06/13/13 0254     Chief Complaint  Patient presents with  . Flank Pain   (Consider location/radiation/quality/duration/timing/severity/associated sxs/prior Treatment) HPI Comments: Paramedics report that they were called to the patient's nursing facility because of a complaint of right flank and side pain. There was a report that she had fallen earlier in the day, it is unsure if she had hit anything, the patient states that she is not hurting at this time and has no complaints and does not want to be here. The patient is currently treated for bipolar disorder, atrial fibrillation and renal insufficiency. She also has hypertension. Nothing seems to make this pain better or worse, it is currently resolved, there was no associated hematuria head injury or extremity pain. The patient states that she is not good at ambulating.  Patient is a 68 y.o. female presenting with flank pain. The history is provided by the patient.  Flank Pain    Past Medical History  Diagnosis Date  . Constipation   . Bipolar 1 disorder   . Hypertension   . Esophageal reflux   . Anxiety   . Back pain   . Osteoarthritis   . Gout   . Adenomatous polyps 03-27-07  . Diverticulosis   . Internal hemorrhoids   . Hypothyroidism   . Renal insufficiency   . Atrial fibrillation    Past Surgical History  Procedure Laterality Date  . Cholecystectomy    . Appendectomy    . Tonsillectomy    . Tubal ligation     Family History  Problem Relation Age of Onset  . Hypertension Mother   . Heart disease Mother     before age 26  . Hyperlipidemia Mother   . Atrial fibrillation Father   . Colon cancer Neg Hx    History  Substance Use Topics  . Smoking status: Former Smoker    Quit date: 05/08/2003  . Smokeless tobacco: Never Used  . Alcohol Use: No   OB History   Grav Para Term Preterm Abortions TAB SAB Ect Mult  Living                 Review of Systems  Genitourinary: Positive for flank pain.  All other systems reviewed and are negative.    Allergies  Review of patient's allergies indicates no known allergies.  Home Medications   Current Outpatient Rx  Name  Route  Sig  Dispense  Refill  . acetaminophen (TYLENOL) 500 MG tablet   Oral   Take 500 mg by mouth every 6 (six) hours as needed for moderate pain.         Marland Kitchen ALPRAZolam (XANAX) 1 MG tablet   Oral   Take 1 mg by mouth 4 (four) times daily.         Marland Kitchen amLODipine (NORVASC) 10 MG tablet   Oral   Take 10 mg by mouth at bedtime.          . calcitRIOL (ROCALTROL) 0.5 MCG capsule   Oral   Take 1 capsule (0.5 mcg total) by mouth daily.   30 capsule   0   . cetirizine (ZYRTEC) 10 MG tablet   Oral   Take 10 mg by mouth daily.         . Cranberry 425 MG CAPS   Oral   Take 1 capsule by mouth 2 (two) times  daily.         . cyclobenzaprine (FLEXERIL) 5 MG tablet   Oral   Take 5 mg by mouth 3 (three) times daily as needed for muscle spasms.          Marland Kitchen docusate sodium (COLACE) 100 MG capsule   Oral   Take 200-300 mg by mouth 2 (two) times daily. 3 caps in am, 2 caps in pm         . epoetin alfa (EPOGEN,PROCRIT) 57846 UNIT/ML injection   Subcutaneous   Inject 20,000 Units into the skin every 14 (fourteen) days.         . ergocalciferol (VITAMIN D2) 50000 UNITS capsule   Oral   Take 50,000 Units by mouth once a week. On thursday         . famotidine (PEPCID) 20 MG tablet   Oral   Take 1 tablet (20 mg total) by mouth daily.   30 tablet   0   . furosemide (LASIX) 40 MG tablet   Oral   Take 1 tablet (40 mg total) by mouth daily.   30 tablet   0   . guaifenesin (ROBITUSSIN) 100 MG/5ML syrup   Oral   Take 200 mg by mouth 4 (four) times daily as needed for cough.          . labetalol (NORMODYNE) 300 MG tablet   Oral   Take 1 tablet (300 mg total) by mouth 2 (two) times daily.   60 tablet   0   .  lamoTRIgine (LAMICTAL) 200 MG tablet   Oral   Take 200 mg by mouth 2 (two) times daily.          Marland Kitchen levothyroxine (SYNTHROID, LEVOTHROID) 75 MCG tablet   Oral   Take 75 mcg by mouth daily before breakfast.         . lubiprostone (AMITIZA) 24 MCG capsule   Oral   Take 24 mcg by mouth 2 (two) times daily with a meal.         . meclizine (ANTIVERT) 25 MG tablet   Oral   Take 25 mg by mouth 3 (three) times daily as needed for dizziness.         . Oxcarbazepine (TRILEPTAL) 300 MG tablet   Oral   Take 900 mg by mouth 2 (two) times daily.          . risperiDONE (RISPERDAL) 2 MG tablet   Oral   Take 4 mg by mouth 2 (two) times daily.          . sevelamer carbonate (RENVELA) 800 MG tablet   Oral   Take 1 tablet (800 mg total) by mouth 3 (three) times daily with meals.   90 tablet   0   . acetaminophen (TYLENOL) 325 MG tablet   Oral   Take 2 tablets (650 mg total) by mouth every 6 (six) hours as needed for pain or fever.         . cephALEXin (KEFLEX) 500 MG capsule   Oral   Take 1 capsule (500 mg total) by mouth 2 (two) times daily.   14 capsule   0   . fluconazole (DIFLUCAN) 50 MG tablet   Oral   Take 1 tablet (50 mg total) by mouth daily. Discontinue after 11/21/2012 doses.   4 tablet   0   . magnesium hydroxide (MILK OF MAGNESIA) 400 MG/5ML suspension   Oral   Take 30-60 mLs by mouth daily as  needed for constipation.         Marland Kitchen omega-3 acid ethyl esters (LOVAZA) 1 G capsule   Oral   Take 4 g by mouth daily.          . polyethylene glycol (MIRALAX / GLYCOLAX) packet   Oral   Take 17 g by mouth daily as needed.          . sodium phosphate (FLEET) enema   Rectal   Place 1 enema rectally as needed (for constipation, may self-administer.).          Marland Kitchen traMADol (ULTRAM) 50 MG tablet   Oral   Take 50 mg by mouth 2 (two) times daily.          BP 158/70  Pulse 85  Temp(Src) 98.4 F (36.9 C) (Oral)  Resp 15  SpO2 94% Physical Exam  Nursing  note and vitals reviewed. Constitutional: She appears well-developed and well-nourished. No distress.  HENT:  Head: Normocephalic and atraumatic.  Mouth/Throat: Oropharynx is clear and moist. No oropharyngeal exudate.  Eyes: Conjunctivae and EOM are normal. Pupils are equal, round, and reactive to light. Right eye exhibits no discharge. Left eye exhibits no discharge. No scleral icterus.  Neck: Normal range of motion. Neck supple. No JVD present. No thyromegaly present.  Cardiovascular: Normal rate, regular rhythm and intact distal pulses.  Exam reveals no gallop and no friction rub.   Murmur ( Systolic) heard. Pulmonary/Chest: Effort normal and breath sounds normal. No respiratory distress. She has no wheezes. She has no rales.  Abdominal: Soft. Bowel sounds are normal. She exhibits no distension and no mass. There is no tenderness.  Musculoskeletal: Normal range of motion. She exhibits no edema and no tenderness.  Lymphadenopathy:    She has no cervical adenopathy.  Neurological: She is alert. Coordination normal.  Skin: Skin is warm and dry. No rash noted. No erythema.  Psychiatric: She has a normal mood and affect. Her behavior is normal.    ED Course  Procedures (including critical care time) Labs Review Labs Reviewed  URINALYSIS, ROUTINE W REFLEX MICROSCOPIC - Abnormal; Notable for the following:    APPearance CLOUDY (*)    Hgb urine dipstick SMALL (*)    Protein, ur 30 (*)    Nitrite POSITIVE (*)    Leukocytes, UA MODERATE (*)    All other components within normal limits  URINE MICROSCOPIC-ADD ON - Abnormal; Notable for the following:    Bacteria, UA MANY (*)    All other components within normal limits  CBC WITH DIFFERENTIAL - Abnormal; Notable for the following:    RBC 2.84 (*)    Hemoglobin 9.4 (*)    HCT 28.4 (*)    RDW 16.9 (*)    Neutrophils Relative % 87 (*)    Lymphocytes Relative 7 (*)    Lymphs Abs 0.6 (*)    All other components within normal limits  CG4  I-STAT (LACTIC ACID) - Abnormal; Notable for the following:    Lactic Acid, Venous 0.31 (*)    All other components within normal limits   Imaging Review Dg Ribs Unilateral W/chest Right  06/13/2013   CLINICAL DATA:  Right anterior rib pain  EXAM: RIGHT RIBS AND CHEST - 3+ VIEW  COMPARISON:  Prior radiograph from 11/14/2012  FINDINGS: Stable cardiomegaly. Atherosclerotic calcifications noted within the aortic arch.  Lungs are mildly hypoinflated. There is mild diffuse pulmonary vascular congestion without overt pulmonary edema. More confluent patchy opacity within the right lung base may  reflect atelectasis or possibly infiltrates. No pleural effusion. No pneumothorax.  No acute rib fracture identified. No abnormality seen underlying the region of interest at the metallic BB in the right lower chest.  IMPRESSION: 1. No acute rib fracture identified. 2. Stable cardiomegaly with mild diffuse pulmonary vascular congestion. 3. More confluent patchy opacities within the peripheral right lower lobe. Findings may reflect atelectasis, although possible infiltrates could be considered in the correct clinical setting.   Electronically Signed   By: Jeannine Boga M.D.   On: 06/13/2013 04:26    EKG Interpretation    Date/Time:  Saturday June 13 2013 02:41:16 EST Ventricular Rate:  78 PR Interval:    QRS Duration: 99 QT Interval:  351 QTC Calculation: 400 R Axis:   49 Text Interpretation:  Normal sinus rhythm with 1st degree A-V block Abnormal R-wave progression, early transition Repol abnrm suggests ischemia, lateral leads Baseline wander in lead(s) I III aVL since last tracing no significant change Confirmed by Paris Chiriboga  MD, Amanda Steuart ([id number]) on 06/13/2013 3:21:45 AM            MDM   1. Flank pain   2. UTI (lower urinary tract infection)    The patient has no reproducible tenderness over her chest wall on the right, there is no posterior thoracic tenderness, no rib or bony tenderness and no  abdominal tenderness or bruising. We'll check a urinalysis as well as a rib series to make sure no occult fractures are missed, the patient appears otherwise stable, blood pressure 149/72, pulse of 77, temperature 98.4, oxygen 97%. She is having no respiratory difficulties, no tachypnea, normal breathing pattern.  D/w pharmacist - recommend keflex 500mg  bid for UTI.  Pt has possible infiltrate on xray  Reevaluated the patient, ongoing benign appearance without any shortness of breath, denies any pain at this time, oxygen saturations 95% on room air without any labored breathing. She has no abnormal lung sounds. Her chest x-ray shows a possible right-sided infiltrate however the patient appears extremely stable given this. She has no leukocytosis, normal active acid, no fever and no hypotension. She has been given antibiotics, I have discussed with the pharmacist the dosing of the Keflex given her underlying renal function, the patient will be stable for discharge back to her nursing facility.  Meds given in ED:  Medications  cephALEXin (KEFLEX) capsule 500 mg (500 mg Oral Given 06/13/13 0500)    New Prescriptions   CEPHALEXIN (KEFLEX) 500 MG CAPSULE    Take 1 capsule (500 mg total) by mouth 2 (two) times daily.      Johnna Acosta, MD 06/13/13 [phone]

## 2013-06-15 LAB — URINE CULTURE: Colony Count: >=100000

## 2013-08-29 ENCOUNTER — Encounter (HOSPITAL_COMMUNITY): Payer: Self-pay | Admitting: Emergency Medicine

## 2013-08-29 ENCOUNTER — Emergency Department (HOSPITAL_COMMUNITY): Payer: Medicare Other

## 2013-08-29 ENCOUNTER — Emergency Department (HOSPITAL_COMMUNITY)
Admission: EM | Admit: 2013-08-29 | Discharge: 2013-08-29 | Disposition: A | Discharge status: Home / Self Care | Payer: Medicare Other | Attending: Emergency Medicine | Admitting: Emergency Medicine

## 2013-08-29 DIAGNOSIS — R269 Unspecified abnormalities of gait and mobility: Secondary | ICD-10-CM | POA: Insufficient documentation

## 2013-08-29 DIAGNOSIS — R296 Repeated falls: Secondary | ICD-10-CM | POA: Insufficient documentation

## 2013-08-29 DIAGNOSIS — Z87891 Personal history of nicotine dependence: Secondary | ICD-10-CM | POA: Insufficient documentation

## 2013-08-29 DIAGNOSIS — I4891 Unspecified atrial fibrillation: Secondary | ICD-10-CM | POA: Insufficient documentation

## 2013-08-29 DIAGNOSIS — Z79899 Other long term (current) drug therapy: Secondary | ICD-10-CM | POA: Insufficient documentation

## 2013-08-29 DIAGNOSIS — Y9389 Activity, other specified: Secondary | ICD-10-CM | POA: Insufficient documentation

## 2013-08-29 DIAGNOSIS — S60229A Contusion of unspecified hand, initial encounter: Secondary | ICD-10-CM | POA: Insufficient documentation

## 2013-08-29 DIAGNOSIS — R001 Bradycardia, unspecified: Secondary | ICD-10-CM

## 2013-08-29 DIAGNOSIS — Z8719 Personal history of other diseases of the digestive system: Secondary | ICD-10-CM | POA: Insufficient documentation

## 2013-08-29 DIAGNOSIS — I10 Essential (primary) hypertension: Secondary | ICD-10-CM | POA: Insufficient documentation

## 2013-08-29 DIAGNOSIS — S46909A Unspecified injury of unspecified muscle, fascia and tendon at shoulder and upper arm level, unspecified arm, initial encounter: Secondary | ICD-10-CM | POA: Insufficient documentation

## 2013-08-29 DIAGNOSIS — E039 Hypothyroidism, unspecified: Secondary | ICD-10-CM | POA: Insufficient documentation

## 2013-08-29 DIAGNOSIS — M1990 Unspecified osteoarthritis, unspecified site: Secondary | ICD-10-CM | POA: Insufficient documentation

## 2013-08-29 DIAGNOSIS — M109 Gout, unspecified: Secondary | ICD-10-CM | POA: Insufficient documentation

## 2013-08-29 DIAGNOSIS — K219 Gastro-esophageal reflux disease without esophagitis: Secondary | ICD-10-CM | POA: Insufficient documentation

## 2013-08-29 DIAGNOSIS — Z9089 Acquired absence of other organs: Secondary | ICD-10-CM | POA: Insufficient documentation

## 2013-08-29 DIAGNOSIS — S4980XA Other specified injuries of shoulder and upper arm, unspecified arm, initial encounter: Secondary | ICD-10-CM | POA: Insufficient documentation

## 2013-08-29 DIAGNOSIS — Y929 Unspecified place or not applicable: Secondary | ICD-10-CM | POA: Insufficient documentation

## 2013-08-29 DIAGNOSIS — W19XXXA Unspecified fall, initial encounter: Secondary | ICD-10-CM

## 2013-08-29 DIAGNOSIS — Z9851 Tubal ligation status: Secondary | ICD-10-CM | POA: Insufficient documentation

## 2013-08-29 DIAGNOSIS — F411 Generalized anxiety disorder: Secondary | ICD-10-CM | POA: Insufficient documentation

## 2013-08-29 DIAGNOSIS — S60221A Contusion of right hand, initial encounter: Secondary | ICD-10-CM

## 2013-08-29 DIAGNOSIS — F319 Bipolar disorder, unspecified: Secondary | ICD-10-CM | POA: Insufficient documentation

## 2013-08-29 DIAGNOSIS — Z9889 Other specified postprocedural states: Secondary | ICD-10-CM | POA: Insufficient documentation

## 2013-08-29 DIAGNOSIS — N289 Disorder of kidney and ureter, unspecified: Secondary | ICD-10-CM | POA: Insufficient documentation

## 2013-08-29 DIAGNOSIS — S0990XA Unspecified injury of head, initial encounter: Secondary | ICD-10-CM | POA: Insufficient documentation

## 2013-08-29 LAB — BASIC METABOLIC PANEL
BUN: 80 mg/dL — AB (ref 6–23)
CO2: 19 meq/L (ref 19–32)
Calcium: 10.5 mg/dL (ref 8.4–10.5)
Chloride: 107 mEq/L (ref 96–112)
Creatinine, Ser: 4.27 mg/dL — ABNORMAL HIGH (ref 0.50–1.10)
GFR calc Af Amer: 12 mL/min — ABNORMAL LOW (ref >90)
GFR calc non Af Amer: 10 mL/min — ABNORMAL LOW (ref >90)
Glucose, Bld: 85 mg/dL (ref 70–99)
Potassium: 5.2 mEq/L (ref 3.7–5.3)
SODIUM: 141 meq/L (ref 137–147)

## 2013-08-29 NOTE — ED Provider Notes (Signed)
CSN: [id number]     Arrival date & time 08/29/13  0230 History   First MD Initiated Contact with Patient 08/29/13 [phone]     Chief Complaint  Patient presents with  . Bradycardia  . Fall     (Consider location/radiation/quality/duration/timing/severity/associated sxs/prior Treatment) Patient is a 68 y.o. female presenting with fall. The history is provided by the patient.  Fall  She states that she lost her balance and fell hitting her head and also her right shoulder and right hand. She states that she loses her balance frequently. She denies loss of consciousness and denies nausea or vomiting. EMS noted bradycardia. She denies dizziness or nausea or chest pain or dyspnea. Pain is mild and she rates it at 3/10.  Past Medical History  Diagnosis Date  . Constipation   . Bipolar 1 disorder   . Hypertension   . Esophageal reflux   . Anxiety   . Back pain   . Osteoarthritis   . Gout   . Adenomatous polyps 03-27-07  . Diverticulosis   . Internal hemorrhoids   . Hypothyroidism   . Renal insufficiency   . Atrial fibrillation    Past Surgical History  Procedure Laterality Date  . Cholecystectomy    . Appendectomy    . Tonsillectomy    . Tubal ligation     Family History  Problem Relation Age of Onset  . Hypertension Mother   . Heart disease Mother     before age 23  . Hyperlipidemia Mother   . Atrial fibrillation Father   . Colon cancer Neg Hx    History  Substance Use Topics  . Smoking status: Former Smoker    Quit date: 05/08/2003  . Smokeless tobacco: Never Used  . Alcohol Use: No   OB History   Grav Para Term Preterm Abortions TAB SAB Ect Mult Living                 Review of Systems  All other systems reviewed and are negative.     Allergies  Review of patient's allergies indicates no known allergies.  Home Medications   Prior to Admission medications   Medication Sig Start Date End Date Taking? Authorizing Provider  acetaminophen (TYLENOL) 500 MG  tablet Take 500 mg by mouth every 4 (four) hours as needed for moderate pain.    Yes Historical Provider, MD  ALPRAZolam Duanne Moron) 1 MG tablet Take 1 mg by mouth 4 (four) times daily.   Yes Historical Provider, MD  ALPRAZolam Duanne Moron) 1 MG tablet Take 1 mg by mouth at bedtime as needed for anxiety or sleep.   Yes Historical Provider, MD  calcitRIOL (ROCALTROL) 0.25 MCG capsule Take 0.5 mcg by mouth daily.   Yes Historical Provider, MD  Cranberry 425 MG CAPS Take 1 capsule by mouth 2 (two) times daily.   Yes Historical Provider, MD  cyclobenzaprine (FLEXERIL) 5 MG tablet Take 5 mg by mouth 3 (three) times daily as needed for muscle spasms.    Yes Historical Provider, MD  docusate sodium (COLACE) 100 MG capsule Take 200-300 mg by mouth 2 (two) times daily. 3 caps in am, 2 caps in pm   Yes Historical Provider, MD  epoetin alfa (EPOGEN,PROCRIT) 27035 UNIT/ML injection Inject 20,000 Units into the skin every 14 (fourteen) days.   Yes Historical Provider, MD  ergocalciferol (VITAMIN D2) 50000 UNITS capsule Take 50,000 Units by mouth once a week. On thursday   Yes Historical Provider, MD  famotidine (PEPCID) 20 MG  tablet Take 1 tablet (20 mg total) by mouth daily. 11/17/12  Yes Modena Jansky, MD  furosemide (LASIX) 40 MG tablet Take 1 tablet (40 mg total) by mouth daily. 11/17/12  Yes Modena Jansky, MD  guaifenesin (ROBITUSSIN) 100 MG/5ML syrup Take 200 mg by mouth 4 (four) times daily as needed for cough.    Yes Historical Provider, MD  labetalol (NORMODYNE) 300 MG tablet Take 1 tablet (300 mg total) by mouth 2 (two) times daily. 11/17/12  Yes Modena Jansky, MD  lamoTRIgine (LAMICTAL) 200 MG tablet Take 200 mg by mouth 2 (two) times daily.    Yes Historical Provider, MD  levothyroxine (SYNTHROID, LEVOTHROID) 125 MCG tablet Take 125 mcg by mouth daily before breakfast.   Yes Historical Provider, MD  lubiprostone (AMITIZA) 24 MCG capsule Take 24 mcg by mouth 2 (two) times daily with a meal.   Yes  Historical Provider, MD  magnesium hydroxide (MILK OF MAGNESIA) 400 MG/5ML suspension Take 30 mLs by mouth daily as needed for constipation.    Yes Historical Provider, MD  meclizine (ANTIVERT) 25 MG tablet Take 25 mg by mouth 3 (three) times daily as needed for dizziness.   Yes Historical Provider, MD  Oxcarbazepine (TRILEPTAL) 300 MG tablet Take 900 mg by mouth 2 (two) times daily.    Yes Historical Provider, MD  polyethylene glycol (MIRALAX / GLYCOLAX) packet Take 17 g by mouth daily.    Yes Historical Provider, MD  risperiDONE (RISPERDAL) 2 MG tablet Take 4 mg by mouth 2 (two) times daily.    Yes Historical Provider, MD  sevelamer carbonate (RENVELA) 800 MG tablet Take 1 tablet (800 mg total) by mouth 3 (three) times daily with meals. 11/17/12  Yes Modena Jansky, MD  sodium phosphate (FLEET) enema Place 1 enema rectally as needed (for constipation, may self-administer.).    Yes Historical Provider, MD   BP 167/67  Pulse 51  Resp 16  SpO2 100% Physical Exam  Nursing note and vitals reviewed.  68 year old female, resting comfortably and in no acute distress. Vital signs are significant for bradycardia with heart rate 51, and hypertension with blood pressure 167/67. Oxygen saturation is 100%, which is normal. Head is normocephalic and atraumatic. PERRLA, EOMI. Oropharynx is clear. Neck is nontender without adenopathy or JVD. Back is nontender and there is no CVA tenderness. Lungs are clear without rales, wheezes, or rhonchi. Chest is nontender. Heart has regular rate and rhythm without murmur. Abdomen is soft, flat, nontender without masses or hepatosplenomegaly and peristalsis is normoactive. Extremities: Ecchymosis is present on the dorsum of the right hand on the radial aspect with tenderness in that area. There is full range of motion of all joints without significant pain. She has no tenderness on direct palpation of her right shoulder. Skin is warm and dry without rash. Neurologic:  Mental status is normal, cranial nerves are intact, there are no motor or sensory deficits.  ED Course  Procedures (including critical care time) Labs Review Results for orders placed during the hospital encounter of [id number]  BASIC METABOLIC PANEL      Result Value Ref Range   Sodium 141  137 - 147 mEq/L   Potassium 5.2  3.7 - 5.3 mEq/L   Chloride 107  96 - 112 mEq/L   CO2 19  19 - 32 mEq/L   Glucose, Bld 85  70 - 99 mg/dL   BUN 80 (*) 6 - 23 mg/dL   Creatinine, Ser 4.27 (*) 0.50 -  1.10 mg/dL   Calcium 10.5  8.4 - 10.5 mg/dL   GFR calc non Af Amer 10 (*) >90 mL/min   GFR calc Af Amer 12 (*) >90 mL/min   Imaging Review Dg Shoulder Right  08/29/2013   CLINICAL DATA:  Status post fall; generalized right shoulder pain.  EXAM: RIGHT SHOULDER - 2+ VIEW  COMPARISON:  Chest radiograph from 06/13/2013  FINDINGS: There is no evidence of fracture or dislocation. The right humeral head is seated within the glenoid fossa. The acromioclavicular joint is unremarkable in appearance. No significant soft tissue abnormalities are seen. The visualized portions of the right lung are clear.  IMPRESSION: No evidence of fracture or dislocation.   Electronically Signed   By: Garald Balding M.D.   On: 08/29/2013 04:51   Ct Head Wo Contrast  08/29/2013   CLINICAL DATA:  Status post fall; concern for head or cervical spine injury.  EXAM: CT HEAD WITHOUT CONTRAST  CT CERVICAL SPINE WITHOUT CONTRAST  TECHNIQUE: Multidetector CT imaging of the head and cervical spine was performed following the standard protocol without intravenous contrast. Multiplanar CT image reconstructions of the cervical spine were also generated.  COMPARISON:  CT of the head performed 11/09/2012, and CT of the cervical spine performed 10/21/2012  FINDINGS: CT HEAD FINDINGS  There is no evidence of acute infarction, mass lesion, or intra- or extra-axial hemorrhage on CT.  Stable ventriculomegaly is again noted; this may reflect mild to moderate  cortical volume loss, though normal-pressure hydrocephalus cannot be entirely excluded. Mild periventricular white matter change likely reflects small vessel ischemic microangiopathy. Cerebellar atrophy is noted.  The posterior fossa, including the cerebellum, brainstem and fourth ventricle, is within normal limits. The third and lateral ventricles, and basal ganglia are unremarkable in appearance. The cerebral hemispheres are symmetric in appearance, with normal gray-white differentiation. No mass effect or midline shift is seen.  There is no evidence of fracture; visualized osseous structures are unremarkable in appearance. The visualized portions of the orbits are within normal limits. There is complete opacification of the right mastoid air cells. The paranasal sinuses and left mastoid air cells are well-aerated. No significant soft tissue abnormalities are seen.  CT CERVICAL SPINE FINDINGS  There is no evidence of fracture or subluxation. Vertebral bodies demonstrate normal height and alignment. Prominent anterior osteophytes are seen along the cervical spine. Intervertebral disc spaces are preserved. Prevertebral soft tissues are within normal limits. The visualized neural foramina are grossly unremarkable.  Scattered calcified foci are seen within the thyroid gland. Mild emphysematous change is noted at the lung apices. No significant soft tissue abnormalities are seen.  IMPRESSION: 1. No evidence of traumatic intracranial injury or fracture. 2. No evidence of fracture or subluxation along the cervical spine. 3. Stable ventriculomegaly again seen; this may reflect mild to moderate cortical volume loss, though normal-pressure hydrocephalus cannot be entirely excluded. Would correlate for associated symptoms, as on prior studies. 4. Mild small vessel ischemic microangiopathy. 5. Complete opacification of the right mastoid air cells. 6. Mild emphysematous change at the lung apices.   Electronically Signed   By:  Garald Balding M.D.   On: 08/29/2013 04:48   Ct Cervical Spine Wo Contrast  08/29/2013   CLINICAL DATA:  Status post fall; concern for head or cervical spine injury.  EXAM: CT HEAD WITHOUT CONTRAST  CT CERVICAL SPINE WITHOUT CONTRAST  TECHNIQUE: Multidetector CT imaging of the head and cervical spine was performed following the standard protocol without intravenous contrast. Multiplanar CT image  reconstructions of the cervical spine were also generated.  COMPARISON:  CT of the head performed 11/09/2012, and CT of the cervical spine performed 10/21/2012  FINDINGS: CT HEAD FINDINGS  There is no evidence of acute infarction, mass lesion, or intra- or extra-axial hemorrhage on CT.  Stable ventriculomegaly is again noted; this may reflect mild to moderate cortical volume loss, though normal-pressure hydrocephalus cannot be entirely excluded. Mild periventricular white matter change likely reflects small vessel ischemic microangiopathy. Cerebellar atrophy is noted.  The posterior fossa, including the cerebellum, brainstem and fourth ventricle, is within normal limits. The third and lateral ventricles, and basal ganglia are unremarkable in appearance. The cerebral hemispheres are symmetric in appearance, with normal gray-white differentiation. No mass effect or midline shift is seen.  There is no evidence of fracture; visualized osseous structures are unremarkable in appearance. The visualized portions of the orbits are within normal limits. There is complete opacification of the right mastoid air cells. The paranasal sinuses and left mastoid air cells are well-aerated. No significant soft tissue abnormalities are seen.  CT CERVICAL SPINE FINDINGS  There is no evidence of fracture or subluxation. Vertebral bodies demonstrate normal height and alignment. Prominent anterior osteophytes are seen along the cervical spine. Intervertebral disc spaces are preserved. Prevertebral soft tissues are within normal limits. The  visualized neural foramina are grossly unremarkable.  Scattered calcified foci are seen within the thyroid gland. Mild emphysematous change is noted at the lung apices. No significant soft tissue abnormalities are seen.  IMPRESSION: 1. No evidence of traumatic intracranial injury or fracture. 2. No evidence of fracture or subluxation along the cervical spine. 3. Stable ventriculomegaly again seen; this may reflect mild to moderate cortical volume loss, though normal-pressure hydrocephalus cannot be entirely excluded. Would correlate for associated symptoms, as on prior studies. 4. Mild small vessel ischemic microangiopathy. 5. Complete opacification of the right mastoid air cells. 6. Mild emphysematous change at the lung apices.   Electronically Signed   By: Roanna Raider M.D.   On: 08/29/2013 04:48   Dg Hand Complete Right  08/29/2013   CLINICAL DATA:  Status post fall; bruising about the right wrist.  EXAM: RIGHT HAND - COMPLETE 3+ VIEW  COMPARISON:  None.  FINDINGS: There is no evidence of fracture or dislocation. The joint spaces are preserved; the soft tissues are unremarkable in appearance. The carpal rows are intact, and demonstrate normal alignment.  IMPRESSION: No evidence of fracture or dislocation.   Electronically Signed   By: Roanna Raider M.D.   On: 08/29/2013 04:50     EKG Interpretation   Date/Time:  Saturday August 29 2013 02:46:01 EDT Ventricular Rate:  49 PR Interval:  228 QRS Duration: 94 QT Interval:  475 QTC Calculation: 429 R Axis:   17 Text Interpretation:  Sinus bradycardia Atrial premature complex Sinus  pause Borderline prolonged PR interval LVH with secondary repolarization  abnormality When compared with ECG of 06/13/2013, Sinus bradycardia has  replaced Sinus rhythm with first degree AV block Confirmed by [facility]  MD,  Johntay Doolen ([id number]) on 08/29/2013 3:35:52 AM      MDM   Final diagnoses:  Fall  Contusion of right hand  Renal insufficiency  Sinus bradycardia     Fall with probable a minor injury. She has been sent for CT of head and cervical spine as well as plain films of her right shoulder and right hand. Bradycardia of uncertain cause. She is on labetalol which should not lower her heart rate significantly. She is  on no other medications that should affect her heart rate. Orthostatic vital signs will be obtained and electrolytes will be checked.  Electrolytes are unremarkable but she is noted to have significant renal insufficiency. Review of old records shows that the creatinine has increased from 3.392 4.27 compared with last August. However, reviewing her records further back, creatinine has been as high as 4.5 in the past. This will need to be followed as an outpatient and patient is advised to followup with her PCP or not followed just. X-rays are negative for fracture or acute injury and she is discharged. Of note, orthostatic vital signs are unremarkable, and heart rate has been consistently above 60.  Delora Fuel, MD [id number]

## 2013-08-29 NOTE — ED Notes (Signed)
Pt reports she lost her balance and fell. C/o lower back pain.

## 2013-08-29 NOTE — ED Notes (Signed)
[facility] called to transport pt 

## 2013-08-29 NOTE — ED Notes (Signed)
Present from [facility] with fall. Pt reports "I fell and lost my balance" , hematoma to right hand, admits to hitting head, denies loc. Upon EMS arrival pt was bradycardic HR between 36-50. Pt denies feeling dizzy or fatigued. States, "I feel fine" alert, oriented x3.

## 2013-08-29 NOTE — ED Notes (Signed)
[facility] at bedside 

## 2013-08-29 NOTE — ED Notes (Signed)
Patient transported to X-ray 

## 2013-08-29 NOTE — Discharge Instructions (Signed)
Your kidney function is a little worse than it was last year. Please make an appointment with your doctor to recheck the kidney tests.  Fall Prevention and Home Safety Falls cause injuries and can affect all age groups. It is possible to use preventive measures to significantly decrease the likelihood of falls. There are many simple measures which can make your home safer and prevent falls. OUTDOORS  Repair cracks and edges of walkways and driveways.  Remove high doorway thresholds.  Trim shrubbery on the main path into your home.  Have good outside lighting.  Clear walkways of tools, rocks, debris, and clutter.  Check that handrails are not broken and are securely fastened. Both sides of steps should have handrails.  Have leaves, snow, and ice cleared regularly.  Use sand or salt on walkways during winter months.  In the garage, clean up grease or oil spills. BATHROOM  Install night lights.  Install grab bars by the toilet and in the tub and shower.  Use non-skid mats or decals in the tub or shower.  Place a plastic non-slip stool in the shower to sit on, if needed.  Keep floors dry and clean up all water on the floor immediately.  Remove soap buildup in the tub or shower on a regular basis.  Secure bath mats with non-slip, double-sided rug tape.  Remove throw rugs and tripping hazards from the floors. BEDROOMS  Install night lights.  Make sure a bedside light is easy to reach.  Do not use oversized bedding.  Keep a telephone by your bedside.  Have a firm chair with side arms to use for getting dressed.  Remove throw rugs and tripping hazards from the floor. KITCHEN  Keep handles on pots and pans turned toward the center of the stove. Use back burners when possible.  Clean up spills quickly and allow time for drying.  Avoid walking on wet floors.  Avoid hot utensils and knives.  Position shelves so they are not too high or low.  Place commonly used  objects within easy reach.  If necessary, use a sturdy step stool with a grab bar when reaching.  Keep electrical cables out of the way.  Do not use floor polish or wax that makes floors slippery. If you must use wax, use non-skid floor wax.  Remove throw rugs and tripping hazards from the floor. STAIRWAYS  Never leave objects on stairs.  Place handrails on both sides of stairways and use them. Fix any loose handrails. Make sure handrails on both sides of the stairways are as long as the stairs.  Check carpeting to make sure it is firmly attached along stairs. Make repairs to worn or loose carpet promptly.  Avoid placing throw rugs at the top or bottom of stairways, or properly secure the rug with carpet tape to prevent slippage. Get rid of throw rugs, if possible.  Have an electrician put in a light switch at the top and bottom of the stairs. OTHER FALL PREVENTION TIPS  Wear low-heel or rubber-soled shoes that are supportive and fit well. Wear closed toe shoes.  When using a stepladder, make sure it is fully opened and both spreaders are firmly locked. Do not climb a closed stepladder.  Add color or contrast paint or tape to grab bars and handrails in your home. Place contrasting color strips on first and last steps.  Learn and use mobility aids as needed. Install an electrical emergency response system.  Turn on lights to avoid dark areas.  Replace light bulbs that burn out immediately. Get light switches that glow.  Arrange furniture to create clear pathways. Keep furniture in the same place.  Firmly attach carpet with non-skid or double-sided tape.  Eliminate uneven floor surfaces.  Select a carpet pattern that does not visually hide the edge of steps.  Be aware of all pets. OTHER HOME SAFETY TIPS  Set the water temperature for 120 F (48.8 C).  Keep emergency numbers on or near the telephone.  Keep smoke detectors on every level of the home and near sleeping  areas. Document Released: 04/13/2002 Document Revised: 10/23/2011 Document Reviewed: 07/13/2011 [facility] Patient Information 2014 [place].  Contusion A contusion is a deep bruise. Contusions are the result of an injury that caused bleeding under the skin. The contusion may turn blue, purple, or yellow. Minor injuries will give you a painless contusion, but more severe contusions may stay painful and swollen for a few weeks.  CAUSES  A contusion is usually caused by a blow, trauma, or direct force to an area of the body. SYMPTOMS   Swelling and redness of the injured area.  Bruising of the injured area.  Tenderness and soreness of the injured area.  Pain. DIAGNOSIS  The diagnosis can be made by taking a history and physical exam. An X-ray, CT scan, or MRI may be needed to determine if there were any associated injuries, such as fractures. TREATMENT  Specific treatment will depend on what area of the body was injured. In general, the best treatment for a contusion is resting, icing, elevating, and applying cold compresses to the injured area. Over-the-counter medicines may also be recommended for pain control. Ask your caregiver what the best treatment is for your contusion. HOME CARE INSTRUCTIONS   Put ice on the injured area.  Put ice in a plastic bag.  Place a towel between your skin and the bag.  Leave the ice on for 15-20 minutes, 03-04 times a day.  Only take over-the-counter or prescription medicines for pain, discomfort, or fever as directed by your caregiver. Your caregiver may recommend avoiding anti-inflammatory medicines (aspirin, ibuprofen, and naproxen) for 48 hours because these medicines may increase bruising.  Rest the injured area.  If possible, elevate the injured area to reduce swelling. SEEK IMMEDIATE MEDICAL CARE IF:   You have increased bruising or swelling.  You have pain that is getting worse.  Your swelling or pain is not relieved with  medicines. MAKE SURE YOU:   Understand these instructions.  Will watch your condition.  Will get help right away if you are not doing well or get worse. Document Released: 01/31/2005 Document Revised: 07/16/2011 Document Reviewed: 02/26/2011 [facility] Patient Information 2014 [place], [place].

## 2013-09-09 ENCOUNTER — Encounter (HOSPITAL_COMMUNITY): Payer: Self-pay | Admitting: Emergency Medicine

## 2013-09-09 ENCOUNTER — Inpatient Hospital Stay (HOSPITAL_COMMUNITY)
Admission: EM | Admit: 2013-09-09 | Discharge: 2013-09-14 | DRG: 689 | Disposition: A | Discharge status: Nursing Home (Includes ICF) | Payer: Medicare Other | Attending: Internal Medicine | Admitting: Internal Medicine

## 2013-09-09 ENCOUNTER — Emergency Department (HOSPITAL_COMMUNITY): Payer: Medicare Other

## 2013-09-09 ENCOUNTER — Inpatient Hospital Stay (HOSPITAL_COMMUNITY): Payer: Medicare Other

## 2013-09-09 DIAGNOSIS — R680 Hypothermia, not associated with low environmental temperature: Secondary | ICD-10-CM

## 2013-09-09 DIAGNOSIS — D509 Iron deficiency anemia, unspecified: Secondary | ICD-10-CM

## 2013-09-09 DIAGNOSIS — N039 Chronic nephritic syndrome with unspecified morphologic changes: Secondary | ICD-10-CM

## 2013-09-09 DIAGNOSIS — E039 Hypothyroidism, unspecified: Secondary | ICD-10-CM

## 2013-09-09 DIAGNOSIS — Z87891 Personal history of nicotine dependence: Secondary | ICD-10-CM

## 2013-09-09 DIAGNOSIS — K5909 Other constipation: Secondary | ICD-10-CM

## 2013-09-09 DIAGNOSIS — E870 Hyperosmolality and hypernatremia: Secondary | ICD-10-CM | POA: Diagnosis present

## 2013-09-09 DIAGNOSIS — E872 Acidosis, unspecified: Secondary | ICD-10-CM

## 2013-09-09 DIAGNOSIS — F314 Bipolar disorder, current episode depressed, severe, without psychotic features: Secondary | ICD-10-CM

## 2013-09-09 DIAGNOSIS — K219 Gastro-esophageal reflux disease without esophagitis: Secondary | ICD-10-CM

## 2013-09-09 DIAGNOSIS — E875 Hyperkalemia: Secondary | ICD-10-CM | POA: Diagnosis present

## 2013-09-09 DIAGNOSIS — F3289 Other specified depressive episodes: Secondary | ICD-10-CM

## 2013-09-09 DIAGNOSIS — Z66 Do not resuscitate: Secondary | ICD-10-CM | POA: Diagnosis present

## 2013-09-09 DIAGNOSIS — I10 Essential (primary) hypertension: Secondary | ICD-10-CM

## 2013-09-09 DIAGNOSIS — K5900 Constipation, unspecified: Secondary | ICD-10-CM | POA: Diagnosis present

## 2013-09-09 DIAGNOSIS — G9341 Metabolic encephalopathy: Secondary | ICD-10-CM | POA: Diagnosis present

## 2013-09-09 DIAGNOSIS — R001 Bradycardia, unspecified: Secondary | ICD-10-CM

## 2013-09-09 DIAGNOSIS — R112 Nausea with vomiting, unspecified: Secondary | ICD-10-CM

## 2013-09-09 DIAGNOSIS — M109 Gout, unspecified: Secondary | ICD-10-CM | POA: Diagnosis present

## 2013-09-09 DIAGNOSIS — F329 Major depressive disorder, single episode, unspecified: Secondary | ICD-10-CM

## 2013-09-09 DIAGNOSIS — N186 End stage renal disease: Secondary | ICD-10-CM

## 2013-09-09 DIAGNOSIS — N189 Chronic kidney disease, unspecified: Secondary | ICD-10-CM

## 2013-09-09 DIAGNOSIS — B952 Enterococcus as the cause of diseases classified elsewhere: Secondary | ICD-10-CM | POA: Diagnosis present

## 2013-09-09 DIAGNOSIS — I498 Other specified cardiac arrhythmias: Secondary | ICD-10-CM | POA: Diagnosis present

## 2013-09-09 DIAGNOSIS — J438 Other emphysema: Secondary | ICD-10-CM

## 2013-09-09 DIAGNOSIS — F3130 Bipolar disorder, current episode depressed, mild or moderate severity, unspecified: Secondary | ICD-10-CM | POA: Diagnosis present

## 2013-09-09 DIAGNOSIS — R42 Dizziness and giddiness: Secondary | ICD-10-CM

## 2013-09-09 DIAGNOSIS — M479 Spondylosis, unspecified: Secondary | ICD-10-CM

## 2013-09-09 DIAGNOSIS — D126 Benign neoplasm of colon, unspecified: Secondary | ICD-10-CM

## 2013-09-09 DIAGNOSIS — K859 Acute pancreatitis without necrosis or infection, unspecified: Secondary | ICD-10-CM

## 2013-09-09 DIAGNOSIS — R6521 Severe sepsis with septic shock: Secondary | ICD-10-CM

## 2013-09-09 DIAGNOSIS — F411 Generalized anxiety disorder: Secondary | ICD-10-CM

## 2013-09-09 DIAGNOSIS — I129 Hypertensive chronic kidney disease with stage 1 through stage 4 chronic kidney disease, or unspecified chronic kidney disease: Secondary | ICD-10-CM | POA: Diagnosis present

## 2013-09-09 DIAGNOSIS — H0520 Unspecified exophthalmos: Secondary | ICD-10-CM

## 2013-09-09 DIAGNOSIS — R079 Chest pain, unspecified: Secondary | ICD-10-CM

## 2013-09-09 DIAGNOSIS — D539 Nutritional anemia, unspecified: Secondary | ICD-10-CM

## 2013-09-09 DIAGNOSIS — E782 Mixed hyperlipidemia: Secondary | ICD-10-CM

## 2013-09-09 DIAGNOSIS — A419 Sepsis, unspecified organism: Secondary | ICD-10-CM

## 2013-09-09 DIAGNOSIS — G929 Unspecified toxic encephalopathy: Secondary | ICD-10-CM | POA: Diagnosis present

## 2013-09-09 DIAGNOSIS — M1990 Unspecified osteoarthritis, unspecified site: Secondary | ICD-10-CM

## 2013-09-09 DIAGNOSIS — A499 Bacterial infection, unspecified: Secondary | ICD-10-CM

## 2013-09-09 DIAGNOSIS — N179 Acute kidney failure, unspecified: Secondary | ICD-10-CM

## 2013-09-09 DIAGNOSIS — N390 Urinary tract infection, site not specified: Secondary | ICD-10-CM

## 2013-09-09 DIAGNOSIS — G92 Toxic encephalopathy: Secondary | ICD-10-CM | POA: Diagnosis present

## 2013-09-09 DIAGNOSIS — R140 Abdominal distension (gaseous): Secondary | ICD-10-CM

## 2013-09-09 DIAGNOSIS — D631 Anemia in chronic kidney disease: Secondary | ICD-10-CM | POA: Diagnosis present

## 2013-09-09 DIAGNOSIS — M1610 Unilateral primary osteoarthritis, unspecified hip: Secondary | ICD-10-CM

## 2013-09-09 DIAGNOSIS — R9431 Abnormal electrocardiogram [ECG] [EKG]: Secondary | ICD-10-CM | POA: Diagnosis present

## 2013-09-09 DIAGNOSIS — M169 Osteoarthritis of hip, unspecified: Secondary | ICD-10-CM

## 2013-09-09 DIAGNOSIS — Z1612 Extended spectrum beta lactamase (ESBL) resistance: Secondary | ICD-10-CM

## 2013-09-09 DIAGNOSIS — I4891 Unspecified atrial fibrillation: Secondary | ICD-10-CM | POA: Diagnosis present

## 2013-09-09 DIAGNOSIS — N184 Chronic kidney disease, stage 4 (severe): Secondary | ICD-10-CM

## 2013-09-09 DIAGNOSIS — K5730 Diverticulosis of large intestine without perforation or abscess without bleeding: Secondary | ICD-10-CM

## 2013-09-09 DIAGNOSIS — Z79899 Other long term (current) drug therapy: Secondary | ICD-10-CM

## 2013-09-09 DIAGNOSIS — R4182 Altered mental status, unspecified: Secondary | ICD-10-CM

## 2013-09-09 HISTORY — DX: Chronic kidney disease, stage 4 (severe): N18.4

## 2013-09-09 HISTORY — DX: Unspecified kyphosis, site unspecified: M40.209

## 2013-09-09 LAB — COMPREHENSIVE METABOLIC PANEL
ALT: 13 U/L (ref 0–35)
AST: 12 U/L (ref 0–37)
Albumin: 3.9 g/dL (ref 3.5–5.2)
Alkaline Phosphatase: 102 U/L (ref 39–117)
BUN: 96 mg/dL — AB (ref 6–23)
CALCIUM: 11 mg/dL — AB (ref 8.4–10.5)
CO2: 17 mEq/L — ABNORMAL LOW (ref 19–32)
Chloride: 103 mEq/L (ref 96–112)
Creatinine, Ser: 5.05 mg/dL — ABNORMAL HIGH (ref 0.50–1.10)
GFR calc Af Amer: 9 mL/min — ABNORMAL LOW (ref >90)
GFR calc non Af Amer: 8 mL/min — ABNORMAL LOW (ref >90)
Glucose, Bld: 89 mg/dL (ref 70–99)
Potassium: 6 mEq/L — ABNORMAL HIGH (ref 3.7–5.3)
Sodium: 142 mEq/L (ref 137–147)
TOTAL PROTEIN: 7.5 g/dL (ref 6.0–8.3)
Total Bilirubin: 0.2 mg/dL — ABNORMAL LOW (ref 0.3–1.2)

## 2013-09-09 LAB — CBC WITH DIFFERENTIAL/PLATELET
Basophils Absolute: 0 10*3/uL (ref 0.0–0.1)
Basophils Relative: 0 % (ref 0–1)
EOS ABS: 0.1 10*3/uL (ref 0.0–0.7)
EOS PCT: 4 % (ref 0–5)
HCT: 26.2 % — ABNORMAL LOW (ref 36.0–46.0)
HEMOGLOBIN: 8.7 g/dL — AB (ref 12.0–15.0)
LYMPHS ABS: 0.6 10*3/uL — AB (ref 0.7–4.0)
Lymphocytes Relative: 19 % (ref 12–46)
MCH: 33.9 pg (ref 26.0–34.0)
MCHC: 33.2 g/dL (ref 30.0–36.0)
MCV: 101.9 fL — ABNORMAL HIGH (ref 78.0–100.0)
MONOS PCT: 4 % (ref 3–12)
Monocytes Absolute: 0.1 10*3/uL (ref 0.1–1.0)
Neutro Abs: 2.3 10*3/uL (ref 1.7–7.7)
Neutrophils Relative %: 73 % (ref 43–77)
Platelets: 174 10*3/uL (ref 150–400)
RBC: 2.57 MIL/uL — AB (ref 3.87–5.11)
RDW: 16.6 % — ABNORMAL HIGH (ref 11.5–15.5)
WBC: 3.1 10*3/uL — ABNORMAL LOW (ref 4.0–10.5)

## 2013-09-09 LAB — URINALYSIS, ROUTINE W REFLEX MICROSCOPIC
Bilirubin Urine: NEGATIVE
Glucose, UA: NEGATIVE mg/dL
KETONES UR: NEGATIVE mg/dL
NITRITE: NEGATIVE
PH: 6 (ref 5.0–8.0)
Protein, ur: 30 mg/dL — AB
SPECIFIC GRAVITY, URINE: 1.013 (ref 1.005–1.030)
Urobilinogen, UA: 0.2 mg/dL (ref 0.0–1.0)

## 2013-09-09 LAB — URINE MICROSCOPIC-ADD ON

## 2013-09-09 LAB — GLUCOSE, RANDOM: Glucose, Bld: 209 mg/dL — ABNORMAL HIGH (ref 70–99)

## 2013-09-09 LAB — LIPASE, BLOOD: Lipase: 820 U/L — ABNORMAL HIGH (ref 11–59)

## 2013-09-09 LAB — CBG MONITORING, ED: Glucose-Capillary: 106 mg/dL — ABNORMAL HIGH (ref 70–99)

## 2013-09-09 LAB — TROPONIN I: Troponin I: <0.3 ng/mL (ref <0.30)

## 2013-09-09 LAB — LACTIC ACID, PLASMA: Lactic Acid, Venous: 0.9 mmol/L (ref 0.5–2.2)

## 2013-09-09 MED ORDER — SODIUM CHLORIDE 0.9 % IV SOLN: Freq: Once | INTRAVENOUS | Status: DC

## 2013-09-09 MED ORDER — CYCLOBENZAPRINE HCL 10 MG PO TABS
5.0000 mg | ORAL_TABLET | Freq: Three times a day (TID) | ORAL | Status: DC | PRN
  Administered 2013-09-10: 11:00:00 via ORAL
  Filled 2013-09-09: qty 1

## 2013-09-09 MED ORDER — HYDROMORPHONE HCL PF 1 MG/ML IJ SOLN
0.5000 mg | INTRAMUSCULAR | Status: DC | PRN
  Administered 2013-09-10: 11:00:00 via INTRAVENOUS
  Filled 2013-09-09: qty 1

## 2013-09-09 MED ORDER — ALBUTEROL (5 MG/ML) CONTINUOUS INHALATION SOLN
10.0000 mg/h | INHALATION_SOLUTION | RESPIRATORY_TRACT | Status: DC
  Administered 2013-09-09: 10 mg/h via RESPIRATORY_TRACT
  Filled 2013-09-09: qty 20

## 2013-09-09 MED ORDER — LABETALOL HCL 300 MG PO TABS
300.0000 mg | ORAL_TABLET | Freq: Two times a day (BID) | ORAL | Status: DC
  Administered 2013-09-10 (×2): 300 mg via ORAL
  Filled 2013-09-09 (×4): qty 1

## 2013-09-09 MED ORDER — LAMOTRIGINE 200 MG PO TABS
200.0000 mg | ORAL_TABLET | Freq: Two times a day (BID) | ORAL | Status: DC
  Administered 2013-09-10 – 2013-09-14 (×10): 200 mg via ORAL
  Filled 2013-09-09 (×14): qty 1

## 2013-09-09 MED ORDER — RISPERIDONE 2 MG PO TABS
4.0000 mg | ORAL_TABLET | Freq: Two times a day (BID) | ORAL | Status: DC
  Administered 2013-09-10 – 2013-09-14 (×10): 4 mg via ORAL
  Filled 2013-09-09 (×13): qty 2

## 2013-09-09 MED ORDER — DOCUSATE SODIUM 100 MG PO CAPS
200.0000 mg | ORAL_CAPSULE | Freq: Every day | ORAL | Status: DC
  Administered 2013-09-10 (×2): 200 mg via ORAL
  Filled 2013-09-09 (×7): qty 2

## 2013-09-09 MED ORDER — HYDRALAZINE HCL 20 MG/ML IJ SOLN
10.0000 mg | Freq: Once | INTRAMUSCULAR | Status: AC
  Administered 2013-09-10: 10 mg via INTRAVENOUS
  Filled 2013-09-09: qty 1

## 2013-09-09 MED ORDER — ONDANSETRON HCL 4 MG PO TABS: 4.0000 mg | ORAL_TABLET | Freq: Four times a day (QID) | ORAL | Status: DC | PRN

## 2013-09-09 MED ORDER — ALUM & MAG HYDROXIDE-SIMETH 200-200-20 MG/5ML PO SUSP: 30.0000 mL | Freq: Four times a day (QID) | ORAL | Status: DC | PRN

## 2013-09-09 MED ORDER — ALPRAZOLAM 0.5 MG PO TABS
1.0000 mg | ORAL_TABLET | Freq: Every evening | ORAL | Status: DC | PRN
  Filled 2013-09-09: qty 2

## 2013-09-09 MED ORDER — CALCIUM GLUCONATE 10 % IV SOLN
1.0000 g | INTRAVENOUS | Status: AC
  Administered 2013-09-09: 1 g via INTRAVENOUS
  Filled 2013-09-09: qty 10

## 2013-09-09 MED ORDER — CALCITRIOL 0.5 MCG PO CAPS
0.5000 ug | ORAL_CAPSULE | Freq: Every day | ORAL | Status: DC
  Administered 2013-09-10 – 2013-09-14 (×5): 0.5 ug via ORAL
  Filled 2013-09-09 (×5): qty 1

## 2013-09-09 MED ORDER — SODIUM CHLORIDE 0.9 % IV SOLN
INTRAVENOUS | Status: DC
  Administered 2013-09-10: 01:00:00 via INTRAVENOUS

## 2013-09-09 MED ORDER — DOCUSATE SODIUM 100 MG PO CAPS
300.0000 mg | ORAL_CAPSULE | Freq: Every day | ORAL | Status: DC
  Administered 2013-09-13 – 2013-09-14 (×2): 300 mg via ORAL
  Filled 2013-09-09 (×5): qty 3

## 2013-09-09 MED ORDER — ALBUTEROL SULFATE (2.5 MG/3ML) 0.083% IN NEBU
10.0000 mg | INHALATION_SOLUTION | Freq: Once | RESPIRATORY_TRACT | Status: DC
  Filled 2013-09-09: qty 12

## 2013-09-09 MED ORDER — SODIUM POLYSTYRENE SULFONATE 15 GM/60ML PO SUSP
30.0000 g | Freq: Once | ORAL | Status: AC
  Administered 2013-09-10: 30 g via ORAL
  Filled 2013-09-09: qty 120

## 2013-09-09 MED ORDER — OXCARBAZEPINE 300 MG PO TABS
900.0000 mg | ORAL_TABLET | Freq: Two times a day (BID) | ORAL | Status: DC
  Administered 2013-09-10 – 2013-09-14 (×10): 900 mg via ORAL
  Filled 2013-09-09 (×12): qty 3

## 2013-09-09 MED ORDER — HEPARIN SODIUM (PORCINE) 5000 UNIT/ML IJ SOLN
5000.0000 [IU] | Freq: Three times a day (TID) | INTRAMUSCULAR | Status: DC
  Administered 2013-09-10 – 2013-09-14 (×13): 5000 [IU] via SUBCUTANEOUS
  Filled 2013-09-09 (×17): qty 1

## 2013-09-09 MED ORDER — LUBIPROSTONE 24 MCG PO CAPS
24.0000 ug | ORAL_CAPSULE | Freq: Two times a day (BID) | ORAL | Status: DC
  Administered 2013-09-10 – 2013-09-14 (×8): 24 ug via ORAL
  Filled 2013-09-09 (×11): qty 1

## 2013-09-09 MED ORDER — GUAIFENESIN 100 MG/5ML PO SYRP
200.0000 mg | ORAL_SOLUTION | Freq: Four times a day (QID) | ORAL | Status: DC | PRN
  Filled 2013-09-09: qty 10

## 2013-09-09 MED ORDER — SODIUM CHLORIDE 0.9 % IJ SOLN
3.0000 mL | Freq: Two times a day (BID) | INTRAMUSCULAR | Status: DC
  Administered 2013-09-10 – 2013-09-13 (×6): 3 mL via INTRAVENOUS

## 2013-09-09 MED ORDER — CALCIUM GLUCONATE 10 % IV SOLN
INTRAVENOUS | Status: AC
  Filled 2013-09-09: qty 10

## 2013-09-09 MED ORDER — SEVELAMER CARBONATE 800 MG PO TABS
800.0000 mg | ORAL_TABLET | Freq: Three times a day (TID) | ORAL | Status: DC
  Administered 2013-09-10 – 2013-09-14 (×8): 800 mg via ORAL
  Filled 2013-09-09 (×17): qty 1

## 2013-09-09 MED ORDER — INSULIN ASPART 100 UNIT/ML ~~LOC~~ SOLN
5.0000 [IU] | Freq: Once | SUBCUTANEOUS | Status: AC
  Administered 2013-09-09: 5 [IU] via INTRAVENOUS
  Filled 2013-09-09: qty 1

## 2013-09-09 MED ORDER — MECLIZINE HCL 25 MG PO TABS
25.0000 mg | ORAL_TABLET | Freq: Three times a day (TID) | ORAL | Status: DC | PRN
  Filled 2013-09-09: qty 1

## 2013-09-09 MED ORDER — ONDANSETRON HCL 4 MG/2ML IJ SOLN: 4.0000 mg | Freq: Four times a day (QID) | INTRAMUSCULAR | Status: DC | PRN

## 2013-09-09 MED ORDER — SODIUM BICARBONATE 8.4 % IV SOLN
50.0000 meq | Freq: Once | INTRAVENOUS | Status: AC
  Administered 2013-09-09: 50 meq via INTRAVENOUS
  Filled 2013-09-09: qty 50

## 2013-09-09 MED ORDER — POLYETHYLENE GLYCOL 3350 17 G PO PACK
17.0000 g | PACK | Freq: Every day | ORAL | Status: DC
  Filled 2013-09-09 (×3): qty 1

## 2013-09-09 MED ORDER — ACETAMINOPHEN 500 MG PO TABS
500.0000 mg | ORAL_TABLET | ORAL | Status: DC | PRN
  Administered 2013-09-10 – 2013-09-14 (×9): 500 mg via ORAL
  Filled 2013-09-09 (×11): qty 1

## 2013-09-09 MED ORDER — LEVOTHYROXINE SODIUM 125 MCG PO TABS
125.0000 ug | ORAL_TABLET | Freq: Every day | ORAL | Status: DC
  Administered 2013-09-10 – 2013-09-13 (×4): 125 ug via ORAL
  Filled 2013-09-09 (×6): qty 1

## 2013-09-09 MED ORDER — FAMOTIDINE 20 MG PO TABS
20.0000 mg | ORAL_TABLET | Freq: Every day | ORAL | Status: DC
  Administered 2013-09-10 – 2013-09-14 (×5): 20 mg via ORAL
  Filled 2013-09-09 (×5): qty 1

## 2013-09-09 MED ORDER — ALPRAZOLAM 0.5 MG PO TABS
1.0000 mg | ORAL_TABLET | Freq: Four times a day (QID) | ORAL | Status: DC
  Administered 2013-09-10 – 2013-09-14 (×13): 1 mg via ORAL
  Filled 2013-09-09 (×13): qty 2

## 2013-09-09 MED ORDER — FUROSEMIDE 10 MG/ML IJ SOLN: 80.0000 mg | Freq: Once | INTRAMUSCULAR | Status: DC

## 2013-09-09 MED ORDER — DEXTROSE 50 % IV SOLN
1.0000 | Freq: Once | INTRAVENOUS | Status: AC
  Administered 2013-09-09: 50 mL via INTRAVENOUS
  Filled 2013-09-09: qty 50

## 2013-09-09 MED ORDER — INSULIN ASPART 100 UNIT/ML IV SOLN: 5.0000 [IU] | Freq: Once | INTRAVENOUS | Status: DC

## 2013-09-09 NOTE — H&P (Signed)
PCP:   Woody Seller, MD   Chief Complaint:  confusion  HPI: 68 yo female h/o esrd (has refused dialysis), bipolar disorder, htn, afib, lives in snf sent in for confusion.  Pt was initially confused on arrival with no focal neuro deficits, and her mental status has normalized since arrival.  No fevers.  No cough.  She has had several episodes of n/v today nonbloody.  She denies any abd pain.  She says they sent her here "to die".  No diarrhea.  No cp.  No swelling.  She feels back to normal thus far.    Review of Systems:  Positive and negative as per HPI otherwise all other systems are negative  Past Medical History: Past Medical History  Diagnosis Date  . Constipation   . Bipolar 1 disorder   . Hypertension   . Esophageal reflux   . Anxiety   . Back pain   . Osteoarthritis   . Gout   . Adenomatous polyps 03-27-07  . Diverticulosis   . Internal hemorrhoids   . Hypothyroidism   . Renal insufficiency   . Atrial fibrillation   . CKD (chronic kidney disease), stage IV   . Kyphosis    Past Surgical History  Procedure Laterality Date  . Cholecystectomy    . Appendectomy    . Tonsillectomy    . Tubal ligation      Medications: Prior to Admission medications   Medication Sig Start Date End Date Taking? Authorizing Provider  acetaminophen (TYLENOL) 500 MG tablet Take 500 mg by mouth every 4 (four) hours as needed for moderate pain.    Yes Historical Provider, MD  ALPRAZolam Duanne Moron) 1 MG tablet Take 1 mg by mouth 4 (four) times daily.   Yes Historical Provider, MD  ALPRAZolam Duanne Moron) 1 MG tablet Take 1 mg by mouth at bedtime as needed for anxiety or sleep.   Yes Historical Provider, MD  calcitRIOL (ROCALTROL) 0.25 MCG capsule Take 0.5 mcg by mouth daily.   Yes Historical Provider, MD  Cranberry 425 MG CAPS Take 1 capsule by mouth 2 (two) times daily.   Yes Historical Provider, MD  cyclobenzaprine (FLEXERIL) 5 MG tablet Take 5 mg by mouth 3 (three) times daily as needed  for muscle spasms.    Yes Historical Provider, MD  docusate sodium (COLACE) 100 MG capsule Take 200-300 mg by mouth 2 (two) times daily. 3 caps in am, 2 caps in pm   Yes Historical Provider, MD  epoetin alfa (EPOGEN,PROCRIT) 62130 UNIT/ML injection Inject 20,000 Units into the skin every 14 (fourteen) days.   Yes Historical Provider, MD  ergocalciferol (VITAMIN D2) 50000 UNITS capsule Take 50,000 Units by mouth once a week. On thursday   Yes Historical Provider, MD  famotidine (PEPCID) 20 MG tablet Take 1 tablet (20 mg total) by mouth daily. 11/17/12  Yes Modena Jansky, MD  furosemide (LASIX) 40 MG tablet Take 1 tablet (40 mg total) by mouth daily. 11/17/12  Yes Modena Jansky, MD  guaifenesin (ROBITUSSIN) 100 MG/5ML syrup Take 200 mg by mouth 4 (four) times daily as needed for cough.    Yes Historical Provider, MD  labetalol (NORMODYNE) 300 MG tablet Take 1 tablet (300 mg total) by mouth 2 (two) times daily. 11/17/12  Yes Modena Jansky, MD  lamoTRIgine (LAMICTAL) 200 MG tablet Take 200 mg by mouth 2 (two) times daily.    Yes Historical Provider, MD  levothyroxine (SYNTHROID, LEVOTHROID) 125 MCG tablet Take 125 mcg by mouth  daily before breakfast.   Yes Historical Provider, MD  lubiprostone (AMITIZA) 24 MCG capsule Take 24 mcg by mouth 2 (two) times daily with a meal.   Yes Historical Provider, MD  magnesium hydroxide (MILK OF MAGNESIA) 400 MG/5ML suspension Take 30 mLs by mouth daily as needed for constipation.    Yes Historical Provider, MD  meclizine (ANTIVERT) 25 MG tablet Take 25 mg by mouth 3 (three) times daily as needed for dizziness.   Yes Historical Provider, MD  Oxcarbazepine (TRILEPTAL) 300 MG tablet Take 900 mg by mouth 2 (two) times daily.    Yes Historical Provider, MD  polyethylene glycol (MIRALAX / GLYCOLAX) packet Take 17 g by mouth daily.    Yes Historical Provider, MD  risperiDONE (RISPERDAL) 2 MG tablet Take 4 mg by mouth 2 (two) times daily.    Yes Historical Provider, MD   sevelamer carbonate (RENVELA) 800 MG tablet Take 1 tablet (800 mg total) by mouth 3 (three) times daily with meals. 11/17/12  Yes Modena Jansky, MD  sodium phosphate (FLEET) enema Place 1 enema rectally as needed (for constipation, may self-administer.).    Yes Historical Provider, MD    Allergies:  No Known Allergies  Social History:  reports that she quit smoking about 10 years ago. She has never used smokeless tobacco. She reports that she does not drink alcohol or use illicit drugs.  Family History: Family History  Problem Relation Age of Onset  . Hypertension Mother   . Heart disease Mother     before age 13  . Hyperlipidemia Mother   . Atrial fibrillation Father   . Colon cancer Neg Hx     Physical Exam: Filed Vitals:   09/09/13 1930 09/09/13 1940 09/09/13 2030 09/09/13 2100  BP: 145/86 145/86 186/81 187/72  Pulse: 60 56 54   Resp: 19 17 16 18   SpO2: 100% 100% 100% 98%   General appearance: alert, cooperative and no distress Head: Normocephalic, without obvious abnormality, atraumatic Eyes: negative Nose: Nares normal. Septum midline. Mucosa normal. No drainage or sinus tenderness. Neck: no JVD and supple, symmetrical, trachea midline Lungs: clear to auscultation bilaterally Heart: regular rate and rhythm, S1, S2 normal, no murmur, click, rub or gallop Abdomen: soft, non-tender; bowel sounds normal; no masses,  no organomegaly Extremities: extremities normal, atraumatic, no cyanosis or edema Pulses: 2+ and symmetric Skin: Skin color, texture, turgor normal. No rashes or lesions Neurologic: Grossly normal    Labs on Admission:   Recent Labs  09/09/13 1644 09/09/13 1850  NA 142  --   K 6.0*  --   CL 103  --   CO2 17*  --   GLUCOSE 89 209*  BUN 96*  --   CREATININE 5.05*  --   CALCIUM 11.0*  --     Recent Labs  09/09/13 1644  AST 12  ALT 13  ALKPHOS 102  BILITOT 0.2*  PROT 7.5  ALBUMIN 3.9    Recent Labs  09/09/13 1644  WBC 3.1*   NEUTROABS 2.3  HGB 8.7*  HCT 26.2*  MCV 101.9*  PLT 174    Recent Labs  09/09/13 1644  TROPONINI <0.30   Radiological Exams on Admission:  Dg Chest Port 1 View  09/09/2013   CLINICAL DATA:  Confusion, history hypertension, atrial fibrillation, chronic kidney disease stage IV  EXAM: PORTABLE CHEST - 1 VIEW  COMPARISON:  Portable exam 1753 hr compared to 06/13/2013  FINDINGS: Mild rotation to the LEFT.  Enlargement of cardiac silhouette.  Atherosclerotic calcification aorta.  Mediastinal contours and pulmonary vascularity otherwise normal.  Lungs clear.  No pleural effusion or pneumothorax.  No acute osseous findings.  IMPRESSION: Enlargement of cardiac silhouette.  No acute abnormalities.   Electronically Signed   By: Lavonia Dana M.D.   On: 09/09/2013 18:01   Assessment/Plan  68 yo female with brief episode of confusion, hyperkalemia, and new wide qrs complex on ekg  Principal Problem:   Encephalopathy, metabolic-  No source of infection.  Renal function is not much worse than usual.  No fever.  ????was initally bradycardic as low as 30s which has resolved, could have been hypoperfusing.  No focal neuro deficits now.  Ct head no acute issues.  freq neuro cks overnight.  Tele monitoring.    Active Problems:   BPLR I, DPRSD, MOST RECENT EPSD, MANIC, SVR   HYPERTENSION   Bradycardia   Acute on chronic renal failure   Pancreatitis, acute- possible, lipase is over 800 but pt only having n/v and no pain.  Keep npo for now.  ivf.   Metabolic acidosis   End stage renal disease   EKG abnormalities   Nausea and vomiting in adult    Pt is DNR which has been confirmed by her dtr on phone by RN.  Nephrology and cardiology services have both been called by ED for consults.     Irja Wheless A Shanon Brow 09/09/2013, 9:17 PM

## 2013-09-09 NOTE — ED Provider Notes (Signed)
CSN: [id number]     Arrival date & time 09/09/13  1511 History   First MD Initiated Contact with Patient 09/09/13 [phone]     Chief Complaint  Patient presents with  . Altered Mental Status  . Emesis     (Consider location/radiation/quality/duration/timing/severity/associated sxs/prior Treatment) HPI Comments: 68 yo female presenting from the her living facility due to confusion.  Pt is unsure why she is here.  She denies specific complaints other than some nausea and vomiting.  Level V caveat secondary to confusion.    Patient is a 68 y.o. female presenting with altered mental status and vomiting.  Altered Mental Status Presenting symptoms: confusion   Severity:  Unable to specify Most recent episode:  More than 2 days ago Episode history:  Continuous Duration:  2 weeks Timing:  Unable to specify Progression:  Worsening Chronicity:  Recurrent Context: nursing home resident   Associated symptoms: nausea and vomiting   Associated symptoms: no abdominal pain and no fever   Associated symptoms comment:  No chest pain, no shortness of breath Emesis Associated symptoms: no abdominal pain and no diarrhea     Past Medical History  Diagnosis Date  . Constipation   . Bipolar 1 disorder   . Hypertension   . Esophageal reflux   . Anxiety   . Back pain   . Osteoarthritis   . Gout   . Adenomatous polyps 03-27-07  . Diverticulosis   . Internal hemorrhoids   . Hypothyroidism   . Renal insufficiency   . Atrial fibrillation   . CKD (chronic kidney disease), stage IV   . Kyphosis    Past Surgical History  Procedure Laterality Date  . Cholecystectomy    . Appendectomy    . Tonsillectomy    . Tubal ligation     Family History  Problem Relation Age of Onset  . Hypertension Mother   . Heart disease Mother     before age 53  . Hyperlipidemia Mother   . Atrial fibrillation Father   . Colon cancer Neg Hx    History  Substance Use Topics  . Smoking status: Former Smoker    Quit  date: 05/08/2003  . Smokeless tobacco: Never Used  . Alcohol Use: No   OB History   Grav Para Term Preterm Abortions TAB SAB Ect Mult Living                 Review of Systems  Constitutional: Negative for fever.  Respiratory: Negative for cough and shortness of breath.   Cardiovascular: Negative for chest pain.  Gastrointestinal: Positive for nausea and vomiting. Negative for abdominal pain and diarrhea.  Psychiatric/Behavioral: Positive for confusion.  All other systems reviewed and are negative.     Allergies  Review of patient's allergies indicates no known allergies.  Home Medications   Prior to Admission medications   Medication Sig Start Date End Date Taking? Authorizing Provider  acetaminophen (TYLENOL) 500 MG tablet Take 500 mg by mouth every 4 (four) hours as needed for moderate pain.    Yes Historical Provider, MD  ALPRAZolam Duanne Moron) 1 MG tablet Take 1 mg by mouth 4 (four) times daily.   Yes Historical Provider, MD  ALPRAZolam Duanne Moron) 1 MG tablet Take 1 mg by mouth at bedtime as needed for anxiety or sleep.   Yes Historical Provider, MD  calcitRIOL (ROCALTROL) 0.25 MCG capsule Take 0.5 mcg by mouth daily.   Yes Historical Provider, MD  Cranberry 425 MG CAPS Take 1 capsule by  mouth 2 (two) times daily.   Yes Historical Provider, MD  cyclobenzaprine (FLEXERIL) 5 MG tablet Take 5 mg by mouth 3 (three) times daily as needed for muscle spasms.    Yes Historical Provider, MD  docusate sodium (COLACE) 100 MG capsule Take 200-300 mg by mouth 2 (two) times daily. 3 caps in am, 2 caps in pm   Yes Historical Provider, MD  epoetin alfa (EPOGEN,PROCRIT) 75643 UNIT/ML injection Inject 20,000 Units into the skin every 14 (fourteen) days.   Yes Historical Provider, MD  ergocalciferol (VITAMIN D2) 50000 UNITS capsule Take 50,000 Units by mouth once a week. On thursday   Yes Historical Provider, MD  famotidine (PEPCID) 20 MG tablet Take 1 tablet (20 mg total) by mouth daily. 11/17/12  Yes  Modena Jansky, MD  furosemide (LASIX) 40 MG tablet Take 1 tablet (40 mg total) by mouth daily. 11/17/12  Yes Modena Jansky, MD  guaifenesin (ROBITUSSIN) 100 MG/5ML syrup Take 200 mg by mouth 4 (four) times daily as needed for cough.    Yes Historical Provider, MD  labetalol (NORMODYNE) 300 MG tablet Take 1 tablet (300 mg total) by mouth 2 (two) times daily. 11/17/12  Yes Modena Jansky, MD  lamoTRIgine (LAMICTAL) 200 MG tablet Take 200 mg by mouth 2 (two) times daily.    Yes Historical Provider, MD  levothyroxine (SYNTHROID, LEVOTHROID) 125 MCG tablet Take 125 mcg by mouth daily before breakfast.   Yes Historical Provider, MD  lubiprostone (AMITIZA) 24 MCG capsule Take 24 mcg by mouth 2 (two) times daily with a meal.   Yes Historical Provider, MD  magnesium hydroxide (MILK OF MAGNESIA) 400 MG/5ML suspension Take 30 mLs by mouth daily as needed for constipation.    Yes Historical Provider, MD  meclizine (ANTIVERT) 25 MG tablet Take 25 mg by mouth 3 (three) times daily as needed for dizziness.   Yes Historical Provider, MD  Oxcarbazepine (TRILEPTAL) 300 MG tablet Take 900 mg by mouth 2 (two) times daily.    Yes Historical Provider, MD  polyethylene glycol (MIRALAX / GLYCOLAX) packet Take 17 g by mouth daily.    Yes Historical Provider, MD  risperiDONE (RISPERDAL) 2 MG tablet Take 4 mg by mouth 2 (two) times daily.    Yes Historical Provider, MD  sevelamer carbonate (RENVELA) 800 MG tablet Take 1 tablet (800 mg total) by mouth 3 (three) times daily with meals. 11/17/12  Yes Modena Jansky, MD  sodium phosphate (FLEET) enema Place 1 enema rectally as needed (for constipation, may self-administer.).    Yes Historical Provider, MD   SpO2 97% Physical Exam  Nursing note and vitals reviewed. Constitutional: She is oriented to person, place, and time. She appears well-developed and well-nourished. No distress.  HENT:  Head: Normocephalic and atraumatic.  Mouth/Throat: Oropharynx is clear and  moist.  Eyes: Conjunctivae and EOM are normal. Pupils are equal, round, and reactive to light. No scleral icterus.  Neck: Neck supple.  Cardiovascular: Normal rate, regular rhythm, normal heart sounds and intact distal pulses.   No murmur heard. Pulmonary/Chest: Effort normal and breath sounds normal. No stridor. No respiratory distress. She has no rales.  Abdominal: Soft. Bowel sounds are normal. She exhibits no distension. There is no tenderness. There is no rebound and no guarding.  Musculoskeletal: Normal range of motion. She exhibits no edema.  Neurological: She is alert and oriented to person, place, and time. GCS eye subscore is 4. GCS verbal subscore is 5. GCS motor subscore is 6.  Skin: Skin is warm and dry. No rash noted.  Psychiatric: She has a normal mood and affect. Her behavior is normal.    ED Course  CRITICAL CARE Performed by: Serita Grit DAVID III Authorized by: Serita Grit DAVID III Total critical care time: 40 minutes Critical care time was exclusive of separately billable procedures and treating other patients. Critical care was necessary to treat or prevent imminent or life-threatening deterioration of the following conditions: metabolic crisis and renal failure. Critical care was time spent personally by me on the following activities: development of treatment plan with patient or surrogate, discussions with consultants, evaluation of patient's response to treatment, examination of patient, obtaining history from patient or surrogate, ordering and performing treatments and interventions, ordering and review of laboratory studies, ordering and review of radiographic studies, pulse oximetry, re-evaluation of patient's condition and review of old charts.   (including critical care time) Labs Review Labs Reviewed  CBC WITH DIFFERENTIAL - Abnormal; Notable for the following:    WBC 3.1 (*)    RBC 2.57 (*)    Hemoglobin 8.7 (*)    HCT 26.2 (*)    MCV 101.9 (*)     RDW 16.6 (*)    Lymphs Abs 0.6 (*)    All other components within normal limits  COMPREHENSIVE METABOLIC PANEL - Abnormal; Notable for the following:    Potassium 6.0 (*)    CO2 17 (*)    BUN 96 (*)    Creatinine, Ser 5.05 (*)    Calcium 11.0 (*)    Total Bilirubin 0.2 (*)    GFR calc non Af Amer 8 (*)    GFR calc Af Amer 9 (*)    All other components within normal limits  URINALYSIS, ROUTINE W REFLEX MICROSCOPIC - Abnormal; Notable for the following:    APPearance CLOUDY (*)    Hgb urine dipstick SMALL (*)    Protein, ur 30 (*)    Leukocytes, UA MODERATE (*)    All other components within normal limits  URINE MICROSCOPIC-ADD ON - Abnormal; Notable for the following:    Bacteria, UA MANY (*)    All other components within normal limits  GLUCOSE, RANDOM - Abnormal; Notable for the following:    Glucose, Bld 209 (*)    All other components within normal limits  LIPASE, BLOOD - Abnormal; Notable for the following:    Lipase 820 (*)    All other components within normal limits  CBG MONITORING, ED - Abnormal; Notable for the following:    Glucose-Capillary 106 (*)    All other components within normal limits  TROPONIN I  LACTIC ACID, PLASMA  CBC  CREATININE, SERUM  COMPREHENSIVE METABOLIC PANEL  CBC  LIPASE, BLOOD    Imaging Review Ct Head Wo Contrast  09/09/2013   CLINICAL DATA:  Confusion 08/29/2013  EXAM: CT HEAD WITHOUT CONTRAST  TECHNIQUE: Contiguous axial images were obtained from the base of the skull through the vertex without intravenous contrast.  COMPARISON:  None.  FINDINGS: Skull and Sinuses:Bilateral mastoid opacification, right more extensive than left. No trabecular erosion. No acute osseous findings.  Orbits: No acute abnormality.  Brain: No evidence of acute abnormality, such as acute infarction, hemorrhage, hydrocephalus, or mass lesion/mass effect. Stable ventriculomegaly, likely related to cerebral atrophy. Dilation is most notable in the atrial and occipital  regions, morphology stable from 2005.  IMPRESSION: 1. Stable exam.  No evidence of acute intracranial disease. 2. Bilateral mastoid opacification, likely effusions.   Electronically Signed  By: Jorje Guild M.D.   On: 09/09/2013 22:13   Dg Chest Port 1 View  09/09/2013   CLINICAL DATA:  Confusion, history hypertension, atrial fibrillation, chronic kidney disease stage IV  EXAM: PORTABLE CHEST - 1 VIEW  COMPARISON:  Portable exam 1753 hr compared to 06/13/2013  FINDINGS: Mild rotation to the LEFT.  Enlargement of cardiac silhouette.  Atherosclerotic calcification aorta.  Mediastinal contours and pulmonary vascularity otherwise normal.  Lungs clear.  No pleural effusion or pneumothorax.  No acute osseous findings.  IMPRESSION: Enlargement of cardiac silhouette.  No acute abnormalities.   Electronically Signed   By: Lavonia Dana M.D.   On: 09/09/2013 18:01  All radiology studies independently viewed by me.      EKG Interpretation   Date/Time:  Wednesday Sep 09 2013 17:04:18 EDT Ventricular Rate:  51 PR Interval:    QRS Duration: 138 QT Interval:  474 QTC Calculation: 437 R Axis:   26 Text Interpretation:  Age not entered, assumed to be  68 years old for  purpose of ECG interpretation Atrial fibrillation Intraventricular  conduction delay Compared to prior, QRS duration now prolonged.  Now in  atrial fibrillation.   Confirmed by [facility]  MD, TREY ([id number]) on 09/09/2013  11:37:31 PM      MDM   Final diagnoses:  EKG abnormalities  Acute on chronic renal failure  Altered mental state  Bipolar I disorder, most recent episode (or current) depressed, severe, without mention of psychotic behavior  Bradycardia  CKD (chronic kidney disease)  Encephalopathy, metabolic  End stage renal disease  Metabolic acidosis  Nausea and vomiting in adult  Pancreatitis, acute  Unspecified essential hypertension    68 yo female presenting with confusion. On initial exam, patient alert but cannot specify  exactly why she was in the ED. However, could carry a conversation otherwise. Denied specific symptoms other than some mild nausea and vomiting. Workup notable for worsening of her chronic kidney disease with mild hyperkalemia. Her EKG showed that her QRS duration was significantly prolonged compared to prior, which was done about 2 weeks ago. Unclear whether this represented a primary cardiac etiology or was secondary to her mild hyperkalemia.  She did not have any chest pain or shortness of breath, and her troponin was negative. Felt very unlikely to be a new LBBB/STEMI equivalent. Given IV calcium, insulin, D50, albuterol.   Discussed with nephrology who recommended IV Lasix, IV fluids, Kayexalate.  They will discuss with her in the morning for dialysis, which she has refused in the past.      Arbie Cookey, MD 09/09/13 [phone]

## 2013-09-09 NOTE — ED Notes (Signed)
Spoke to [employer] on phone. POA communicated with RN that patient is a DNR and that she will not be able to make it here to [facility] until morning [place].

## 2013-09-09 NOTE — ED Notes (Signed)
Spoke to CT patient had CT ordered at 1600 and has not been completed yet. CT to pick up patient now.

## 2013-09-09 NOTE — ED Notes (Signed)
Attempted report x1. 

## 2013-09-09 NOTE — ED Notes (Signed)
Pt from [facility] with staff c/o decrease in mental status over the last 2 weeks which is usually associated with a UTI for this pt.  Pt reports N/V x3 days, vomiting x 2 today.  EMS reports pt in a slow a-fib with HR from 30-56 and very mild confusion but able to answer all questions appropirately.  Pt in NAD, A&O.

## 2013-09-10 LAB — CBC
HEMATOCRIT: 25.2 % — AB (ref 36.0–46.0)
HEMOGLOBIN: 8.4 g/dL — AB (ref 12.0–15.0)
MCH: 33.9 pg (ref 26.0–34.0)
MCHC: 33.3 g/dL (ref 30.0–36.0)
MCV: 101.6 fL — ABNORMAL HIGH (ref 78.0–100.0)
Platelets: 173 10*3/uL (ref 150–400)
RBC: 2.48 MIL/uL — ABNORMAL LOW (ref 3.87–5.11)
RDW: 16.7 % — ABNORMAL HIGH (ref 11.5–15.5)
WBC: 3.8 10*3/uL — ABNORMAL LOW (ref 4.0–10.5)

## 2013-09-10 LAB — COMPREHENSIVE METABOLIC PANEL
ALT: 13 U/L (ref 0–35)
AST: 13 U/L (ref 0–37)
Albumin: 3.4 g/dL — ABNORMAL LOW (ref 3.5–5.2)
Alkaline Phosphatase: 95 U/L (ref 39–117)
BUN: 94 mg/dL — ABNORMAL HIGH (ref 6–23)
CALCIUM: 10.5 mg/dL (ref 8.4–10.5)
CO2: 19 mEq/L (ref 19–32)
Chloride: 110 mEq/L (ref 96–112)
Creatinine, Ser: 4.76 mg/dL — ABNORMAL HIGH (ref 0.50–1.10)
GFR calc non Af Amer: 9 mL/min — ABNORMAL LOW (ref >90)
GFR, EST AFRICAN AMERICAN: 10 mL/min — AB (ref >90)
GLUCOSE: 86 mg/dL (ref 70–99)
Potassium: 4.9 mEq/L (ref 3.7–5.3)
SODIUM: 147 meq/L (ref 137–147)
TOTAL PROTEIN: 6.9 g/dL (ref 6.0–8.3)
Total Bilirubin: <0.2 mg/dL — ABNORMAL LOW (ref 0.3–1.2)

## 2013-09-10 LAB — LIPASE, BLOOD: Lipase: 1732 U/L — ABNORMAL HIGH (ref 11–59)

## 2013-09-10 MED ORDER — DEXTROSE 5 % IV SOLN
1.0000 g | INTRAVENOUS | Status: DC
  Administered 2013-09-10 – 2013-09-14 (×5): 1 g via INTRAVENOUS
  Filled 2013-09-10 (×5): qty 10

## 2013-09-10 MED ORDER — BIOTENE DRY MOUTH MT LIQD
15.0000 mL | Freq: Two times a day (BID) | OROMUCOSAL | Status: DC
  Administered 2013-09-10 – 2013-09-13 (×8): 15 mL via OROMUCOSAL

## 2013-09-10 MED ORDER — LABETALOL HCL 100 MG PO TABS
100.0000 mg | ORAL_TABLET | Freq: Two times a day (BID) | ORAL | Status: DC
  Administered 2013-09-10 – 2013-09-14 (×8): 100 mg via ORAL
  Filled 2013-09-10 (×9): qty 1

## 2013-09-10 NOTE — Progress Notes (Signed)
Heart rate 56. Pt increased in alertness.. Oriented to name and place. Will continue to monitor

## 2013-09-10 NOTE — Progress Notes (Signed)
Clinical Social Work Department BRIEF PSYCHOSOCIAL ASSESSMENT 09/10/2013  Patient:  [name], [name]     Account Number:  [id number]     Admit date:  09/09/2013  Clinical Social Worker:  Megan Salon  Date/Time:  09/10/2013 03:24 PM  Referred by:  Care Management  Date Referred:  09/10/2013 Referred for  ALF Placement   Other Referral:   Interview type:  Other - See comment Other interview type:   CSW spoke to patient's sister who states she is POA    PSYCHOSOCIAL DATA Living Status:  FACILITY Admitted from facility:  [facility] [place] Level of care:  Assisted Living Primary support name:  Eliezer Mccoy Primary support relationship to patient:  SIBLING Degree of support available:   Good    CURRENT CONCERNS Current Concerns  Post-Acute Placement   Other Concerns:    SOCIAL WORK ASSESSMENT / PLAN CSW received referral that patient is from [facility]F. CSW reviewed chart and noticed patient is admitted with AMS. CSW called patient's sister over the phone and explained reason for phone call and social work role. Patient's sister states she wants patient to go back to her ALF because she lives there with her husband and states her husband is able to help care for patient and does not want to separate them at this time. CSW then called ALF and spoke with Truddie Crumble, RN who states they are able to take patient back when medically ready. CSW continues to follow.   Assessment/plan status:  Psychosocial Support/Ongoing Assessment of Needs Other assessment/ plan:   Information/referral to community resources:   CSW contact information    PATIENT'S/FAMILY'S RESPONSE TO PLAN OF CARE: Patient's sister states she would like patient to go back with her husband at the ALF.        Jeanette Caprice, MSW, [place]

## 2013-09-10 NOTE — Progress Notes (Signed)
UR Completed.  Aarion Kittrell Jane Avangeline Stockburger [phone] 09/10/2013  

## 2013-09-10 NOTE — Consult Note (Signed)
68 yo female h/o CKD stage 4, creat on 11/17/12 was 3.39 mg/dl, on 08/29/13 was 4.27 mg/dl.  She carries a diagnosis of a psychiatric illness, htn, afib, lives in snf sent in for confusion. She has frequent UTIs. And prior urinary manipulations by urology. Pt was initially confused on arrival with reportedly no focal neuro deficits.  According to note of her nephrologist Dr. Moshe Cipro on 03/13/13  "She is of the opinion that she would want to do it(Dialysis)if she needed it."  Pt found to have UTI and it is being treated.  On admission potassium was 6.0 and creat 5.50 and calcium 11.   Past Medical History  Diagnosis Date  . Constipation   . Bipolar 1 disorder   . Hypertension   . Esophageal reflux   . Anxiety   . Back pain   . Osteoarthritis   . Gout   . Adenomatous polyps 03-27-07  . Diverticulosis   . Internal hemorrhoids   . Hypothyroidism   . Renal insufficiency   . Atrial fibrillation   . CKD (chronic kidney disease), stage IV   . Kyphosis    Past Surgical History  Procedure Laterality Date  . Cholecystectomy    . Appendectomy    . Tonsillectomy    . Tubal ligation     Social History:  reports that she quit smoking about 10 years ago. She has never used smokeless tobacco. She reports that she does not drink alcohol or use illicit drugs. Allergies: No Known Allergies Family History  Problem Relation Age of Onset  . Hypertension Mother   . Heart disease Mother     before age 3  . Hyperlipidemia Mother   . Atrial fibrillation Father   . Colon cancer Neg Hx     Medications:  Scheduled: . ALPRAZolam  1 mg Oral QID  . antiseptic oral rinse  15 mL Mouth Rinse BID  . calcitRIOL  0.5 mcg Oral Daily  . cefTRIAXone (ROCEPHIN)  IV  1 g Intravenous Q24H  . docusate sodium  200 mg Oral QHS  . docusate sodium  300 mg Oral Daily  . famotidine  20 mg Oral Daily  . heparin  5,000 Units Subcutaneous 3 times per day  . labetalol  100 mg Oral BID  . lamoTRIgine  200 mg Oral BID   . levothyroxine  125 mcg Oral QAC breakfast  . lubiprostone  24 mcg Oral BID WC  . Oxcarbazepine  900 mg Oral BID  . polyethylene glycol  17 g Oral Daily  . risperiDONE  4 mg Oral BID  . sevelamer carbonate  800 mg Oral TID WC  . sodium chloride  3 mL Intravenous Q12H   ROS: Unable to obtain due to mental status  Blood pressure 165/79, pulse 68, temperature 97.7 F (36.5 C), temperature source Oral, resp. rate 18, height '5\' 6"'  (1.676 m), weight 64.8 kg (142 lb 13.7 oz), SpO2 95.00%.  General appearance: confused, agitated not answering appropriately Head: Normocephalic, without obvious abnormality, atraumatic kyphotic GI: soft abdomen ? Suprapubic tenderness Extremities: tr edema Skin: Skin color, texture, turgor normal. No rashes or lesions Neurologic: agitated and confused Results for orders placed during the hospital encounter of 09/09/13 (from the past 48 hour(s))  CBC WITH DIFFERENTIAL     Status: Abnormal   Collection Time    09/09/13  4:44 PM      Result Value Ref Range   WBC 3.1 (*) 4.0 - 10.5 K/uL   RBC 2.57 (*) 3.87 -  5.11 MIL/uL   Hemoglobin 8.7 (*) 12.0 - 15.0 g/dL   HCT 26.2 (*) 36.0 - 46.0 %   MCV 101.9 (*) 78.0 - 100.0 fL   MCH 33.9  26.0 - 34.0 pg   MCHC 33.2  30.0 - 36.0 g/dL   RDW 16.6 (*) 11.5 - 15.5 %   Platelets 174  150 - 400 K/uL   Neutrophils Relative % 73  43 - 77 %   Neutro Abs 2.3  1.7 - 7.7 K/uL   Lymphocytes Relative 19  12 - 46 %   Lymphs Abs 0.6 (*) 0.7 - 4.0 K/uL   Monocytes Relative 4  3 - 12 %   Monocytes Absolute 0.1  0.1 - 1.0 K/uL   Eosinophils Relative 4  0 - 5 %   Eosinophils Absolute 0.1  0.0 - 0.7 K/uL   Basophils Relative 0  0 - 1 %   Basophils Absolute 0.0  0.0 - 0.1 K/uL  COMPREHENSIVE METABOLIC PANEL     Status: Abnormal   Collection Time    09/09/13  4:44 PM      Result Value Ref Range   Sodium 142  137 - 147 mEq/L   Potassium 6.0 (*) 3.7 - 5.3 mEq/L   Chloride 103  96 - 112 mEq/L   CO2 17 (*) 19 - 32 mEq/L   Glucose,  Bld 89  70 - 99 mg/dL   BUN 96 (*) 6 - 23 mg/dL   Creatinine, Ser 5.05 (*) 0.50 - 1.10 mg/dL   Calcium 11.0 (*) 8.4 - 10.5 mg/dL   Total Protein 7.5  6.0 - 8.3 g/dL   Albumin 3.9  3.5 - 5.2 g/dL   AST 12  0 - 37 U/L   ALT 13  0 - 35 U/L   Alkaline Phosphatase 102  39 - 117 U/L   Total Bilirubin 0.2 (*) 0.3 - 1.2 mg/dL   GFR calc non Af Amer 8 (*) >90 mL/min   GFR calc Af Amer 9 (*) >90 mL/min   Comment: (NOTE)     The eGFR has been calculated using the CKD EPI equation.     This calculation has not been validated in all clinical situations.     eGFR's persistently <90 mL/min signify possible Chronic Kidney     Disease.  TROPONIN I     Status: None   Collection Time    09/09/13  4:44 PM      Result Value Ref Range   Troponin I <0.30  <0.30 ng/mL   Comment:            Due to the release kinetics of cTnI,     a negative result within the first hours     of the onset of symptoms does not rule out     myocardial infarction with certainty.     If myocardial infarction is still suspected,     repeat the test at appropriate intervals.  URINALYSIS, ROUTINE W REFLEX MICROSCOPIC     Status: Abnormal   Collection Time    09/09/13  4:55 PM      Result Value Ref Range   Color, Urine YELLOW  YELLOW   APPearance CLOUDY (*) CLEAR   Specific Gravity, Urine 1.013  1.005 - 1.030   pH 6.0  5.0 - 8.0   Glucose, UA NEGATIVE  NEGATIVE mg/dL   Hgb urine dipstick SMALL (*) NEGATIVE   Bilirubin Urine NEGATIVE  NEGATIVE   Ketones, ur NEGATIVE  NEGATIVE mg/dL   Protein, ur 30 (*) NEGATIVE mg/dL   Urobilinogen, UA 0.2  0.0 - 1.0 mg/dL   Nitrite NEGATIVE  NEGATIVE   Leukocytes, UA MODERATE (*) NEGATIVE  URINE MICROSCOPIC-ADD ON     Status: Abnormal   Collection Time    09/09/13  4:55 PM      Result Value Ref Range   Squamous Epithelial / LPF RARE  RARE   WBC, UA 7-10  <3 WBC/hpf   RBC / HPF 0-2  <3 RBC/hpf   Bacteria, UA MANY (*) RARE  GLUCOSE, RANDOM     Status: Abnormal   Collection Time     09/09/13  6:50 PM      Result Value Ref Range   Glucose, Bld 209 (*) 70 - 99 mg/dL  LIPASE, BLOOD     Status: Abnormal   Collection Time    09/09/13  7:56 PM      Result Value Ref Range   Lipase 820 (*) 11 - 59 U/L  LACTIC ACID, PLASMA     Status: None   Collection Time    09/09/13  7:57 PM      Result Value Ref Range   Lactic Acid, Venous 0.9  0.5 - 2.2 mmol/L  CBG MONITORING, ED     Status: Abnormal   Collection Time    09/09/13  8:28 PM      Result Value Ref Range   Glucose-Capillary 106 (*) 70 - 99 mg/dL  COMPREHENSIVE METABOLIC PANEL     Status: Abnormal   Collection Time    09/10/13  5:05 AM      Result Value Ref Range   Sodium 147  137 - 147 mEq/L   Potassium 4.9  3.7 - 5.3 mEq/L   Comment: DELTA CHECK NOTED     NO VISIBLE HEMOLYSIS   Chloride 110  96 - 112 mEq/L   CO2 19  19 - 32 mEq/L   Glucose, Bld 86  70 - 99 mg/dL   BUN 94 (*) 6 - 23 mg/dL   Creatinine, Ser 4.76 (*) 0.50 - 1.10 mg/dL   Calcium 10.5  8.4 - 10.5 mg/dL   Total Protein 6.9  6.0 - 8.3 g/dL   Albumin 3.4 (*) 3.5 - 5.2 g/dL   AST 13  0 - 37 U/L   ALT 13  0 - 35 U/L   Alkaline Phosphatase 95  39 - 117 U/L   Total Bilirubin <0.2 (*) 0.3 - 1.2 mg/dL   GFR calc non Af Amer 9 (*) >90 mL/min   GFR calc Af Amer 10 (*) >90 mL/min   Comment: (NOTE)     The eGFR has been calculated using the CKD EPI equation.     This calculation has not been validated in all clinical situations.     eGFR's persistently <90 mL/min signify possible Chronic Kidney     Disease.  CBC     Status: Abnormal   Collection Time    09/10/13  5:05 AM      Result Value Ref Range   WBC 3.8 (*) 4.0 - 10.5 K/uL   RBC 2.48 (*) 3.87 - 5.11 MIL/uL   Hemoglobin 8.4 (*) 12.0 - 15.0 g/dL   HCT 25.2 (*) 36.0 - 46.0 %   MCV 101.6 (*) 78.0 - 100.0 fL   MCH 33.9  26.0 - 34.0 pg   MCHC 33.3  30.0 - 36.0 g/dL   RDW 16.7 (*) 11.5 - 15.5 %  Platelets 173  150 - 400 K/uL  LIPASE, BLOOD     Status: Abnormal   Collection Time    09/10/13  5:05  AM      Result Value Ref Range   Lipase 1732 (*) 11 - 59 U/L   Ct Head Wo Contrast  09/09/2013   CLINICAL DATA:  Confusion 08/29/2013  EXAM: CT HEAD WITHOUT CONTRAST  TECHNIQUE: Contiguous axial images were obtained from the base of the skull through the vertex without intravenous contrast.  COMPARISON:  None.  FINDINGS: Skull and Sinuses:Bilateral mastoid opacification, right more extensive than left. No trabecular erosion. No acute osseous findings.  Orbits: No acute abnormality.  Brain: No evidence of acute abnormality, such as acute infarction, hemorrhage, hydrocephalus, or mass lesion/mass effect. Stable ventriculomegaly, likely related to cerebral atrophy. Dilation is most notable in the atrial and occipital regions, morphology stable from 2005.  IMPRESSION: 1. Stable exam.  No evidence of acute intracranial disease. 2. Bilateral mastoid opacification, likely effusions.   Electronically Signed   By: Jorje Guild M.D.   On: 09/09/2013 22:13   Dg Chest Port 1 View  09/09/2013   CLINICAL DATA:  Confusion, history hypertension, atrial fibrillation, chronic kidney disease stage IV  EXAM: PORTABLE CHEST - 1 VIEW  COMPARISON:  Portable exam 1753 hr compared to 06/13/2013  FINDINGS: Mild rotation to the LEFT.  Enlargement of cardiac silhouette.  Atherosclerotic calcification aorta.  Mediastinal contours and pulmonary vascularity otherwise normal.  Lungs clear.  No pleural effusion or pneumothorax.  No acute osseous findings.  IMPRESSION: Enlargement of cardiac silhouette.  No acute abnormalities.   Electronically Signed   By: Lavonia Dana M.D.   On: 09/09/2013 18:01   Assessment: 1 CKD with AKI 2 Hyperkalemia resolved 3 Hypertension 4 UTI 5 AMS with hx of psychiatric illness Plan: 1 supportive therapy  Estanislado Emms 09/10/2013, 12:11 PM

## 2013-09-10 NOTE — Evaluation (Signed)
Physical Therapy Evaluation Patient Details Name: [name] MRN: [id number] DOB: 1946/01/19 Today's Date: 09/10/2013   History of Present Illness  Pt adm on 09/09/13 with hyperkalemia, UTI, bradycardia.   Clinical Impression  Pt with little participation in PT eval. Per old chart pt was amb in July 2014. Unsure is pt has remained ambulatory. [facility] with [facility] at [facility]. Will follow acutely for a trial of PT to continue to assess mobility and determine prior level of function.    Follow Up Recommendations Other (comment) (TBD)    Equipment Recommendations  None recommended by PT    Recommendations for Other Services       Precautions / Restrictions Precautions Precautions: Fall      Mobility  Bed Mobility Overal bed mobility: Needs Assistance Bed Mobility: Supine to Sit;Sit to Supine     Supine to sit: +2 for physical assistance;Total assist Sit to supine: +2 for physical assistance;Total assist   General bed mobility comments: Pt didn't assist at all for mobility.  Transfers                    Ambulation/Gait                Stairs            Wheelchair Mobility    Modified Rankin (Stroke Patients Only)       Balance Overall balance assessment: Needs assistance Sitting-balance support: Bilateral upper extremity supported Sitting balance-Leahy Scale: Zero Sitting balance - Comments: Pt with severe kyphotic posture.                                     Pertinent Vitals/Pain VSS    Home Living Family/patient expects to be discharged to:: Assisted living               Home Equipment: Gilford Rile - 4 wheels Additional Comments: Pt lives at [facility] with husband.    Prior Function           Comments: Per old chart from 11/2012 pt was amb at that time with walker and assist. Unsure if pt has remained ambulatory.     Hand Dominance        Extremity/Trunk Assessment   Upper  Extremity Assessment: Difficult to assess due to impaired cognition           Lower Extremity Assessment: Difficult to assess due to impaired cognition      Cervical / Trunk Assessment: Kyphotic  Communication   Communication: Other (comment) (Pt mumbled but unable to understand.)  Cognition Arousal/Alertness: Lethargic   Overall Cognitive Status: Difficult to assess                      General Comments      Exercises        Assessment/Plan    PT Assessment Patient needs continued PT services  PT Diagnosis Generalized weakness   PT Problem List Decreased activity tolerance;Decreased balance;Decreased mobility;Decreased strength  PT Treatment Interventions DME instruction;Gait training;Functional mobility training;Therapeutic activities;Balance training;Patient/family education   PT Goals (Current goals can be found in the Care Plan section) Acute Rehab PT Goals Patient Stated Goal: Pt unable PT Goal Formulation: Patient unable to participate in goal setting Time For Goal Achievement: 09/17/13 Potential to Achieve Goals: Fair    Frequency Min 2X/week   Barriers to discharge  Co-evaluation               End of Session   Activity Tolerance: Patient limited by lethargy Patient left: in bed;with call bell/phone within reach;with bed alarm set Nurse Communication: Mobility status         Time: 1451-1459 PT Time Calculation (min): 8 min   Charges:   PT Evaluation $Initial PT Evaluation Tier I: 1 Procedure     PT G CodesShary Decamp Minetta Krisher 09/10/2013, 4:47 PM  [employer] PT [phone]

## 2013-09-10 NOTE — Care Management Note (Unsigned)
    Page 1 of 1   09/10/2013     3:52:53 PM CARE MANAGEMENT NOTE 09/10/2013  Patient:  [name], [name]   Account Number:  [id number]  Date Initiated:  09/10/2013  Documentation initiated by:  Arisa Congleton  Subjective/Objective Assessment:   Pt adm on 09/09/13 with hyperkalemia, UTI, bradycardia.  PTA, pt resides at [facility].     Action/Plan:   Will consult CSW to facilitate return to ALF when medically stable for dc.   Anticipated DC Date:  09/14/2013   Anticipated DC Plan:  ASSISTED LIVING / REST HOME  In-house referral  Clinical Social Worker      DC Planning Services  CM consult      Choice offered to / List presented to:             Status of service:  In process, will continue to follow Medicare Important Message given?   (If response is "NO", the following Medicare IM given date fields will be blank) Date Medicare IM given:   Date Additional Medicare IM given:    Discharge Disposition:    Per UR Regulation:  Reviewed for med. necessity/level of care/duration of stay  If discussed at [place] of Stay Meetings, dates discussed:    Comments:

## 2013-09-10 NOTE — Progress Notes (Signed)
Dr paged [place] heart rate. Heart rate range 30-40. Pt is arousable but words are incoherent. B/p 138/89 02 sat 96 on room air. Will continue to montior

## 2013-09-10 NOTE — Progress Notes (Addendum)
PROGRESS NOTE  [name] SWF:[id number] DOB: 1945/10/04 DOA: 09/09/2013 PCP: Woody Seller, MD  Assessment/Plan: Encephalopathy, metabolic- appears to may be have UTI- culture ordered and rocephin given  Renal function is not much worse than usual. No fever. ????was initally bradycardic as low as 30s which has resolved, could have been hyperkalemia. -tele  Elevated lipase- ? Etiology -was supposed to have MRCP last year 6 weeks after d/c- monitor -monitor -watch for pain development with initiation of food  Hyperkalemia -treated with kayexelate  BPLR I, DPRSD, MOST RECENT EPSD, MANIC, SVR   HYPERTENSION   Bradycardia   Acute on chronic renal failure - has refused dialysis in the past -nephrology consult  Metabolic acidosis   Nausea and vomiting in adult- resolved  PT/OT  Code Status: DNR Family Communication: patient- no family available Disposition Plan: admit   Consultants: nephro  Procedures:      HPI/Subjective: Having multiple BMs -no CP, no belly pain -wants to eat  Objective: Filed Vitals:   09/10/13 0704  BP: 165/79  Pulse: 68  Temp:   Resp:     Intake/Output Summary (Last 24 hours) at 09/10/13 0903 Last data filed at 09/10/13 0740  Gross per 24 hour  Intake      0 ml  Output    601 ml  Net   -601 ml   Filed Weights   09/10/13 0417  Weight: 64.8 kg (142 lb 13.7 oz)    Exam:   General:  Pleasant/ccoperative, difficult to understand  Cardiovascular: rrr  Respiratory: clear, no wheezing  Abdomen: +BS, soft, NT  Musculoskeletal: moves all 4 ext, no edmea   Data Reviewed: Basic Metabolic Panel:  Recent Labs Lab 09/09/13 1644 09/09/13 1850 09/10/13 0505  NA 142  --  147  K 6.0*  --  4.9  CL 103  --  110  CO2 17*  --  19  GLUCOSE 89 209* 86  BUN 96*  --  94*  CREATININE 5.05*  --  4.76*  CALCIUM 11.0*  --  10.5   Liver Function Tests:  Recent Labs Lab 09/09/13 1644 09/10/13 0505  AST 12 13  ALT 13 13    ALKPHOS 102 95  BILITOT 0.2* <0.2*  PROT 7.5 6.9  ALBUMIN 3.9 3.4*    Recent Labs Lab 09/09/13 1956 09/10/13 0505  LIPASE 820* 1732*   No results found for this basename: AMMONIA,  in the last 168 hours CBC:  Recent Labs Lab 09/09/13 1644 09/10/13 0505  WBC 3.1* 3.8*  NEUTROABS 2.3  --   HGB 8.7* 8.4*  HCT 26.2* 25.2*  MCV 101.9* 101.6*  PLT 174 173   Cardiac Enzymes:  Recent Labs Lab 09/09/13 1644  TROPONINI <0.30   BNP (last 3 results) No results found for this basename: PROBNP,  in the last 8760 hours CBG:  Recent Labs Lab 09/09/13 2028  GLUCAP 106*    No results found for this or any previous visit (from the past 240 hour(s)).   Studies: Ct Head Wo Contrast  09/09/2013   CLINICAL DATA:  Confusion 08/29/2013  EXAM: CT HEAD WITHOUT CONTRAST  TECHNIQUE: Contiguous axial images were obtained from the base of the skull through the vertex without intravenous contrast.  COMPARISON:  None.  FINDINGS: Skull and Sinuses:Bilateral mastoid opacification, right more extensive than left. No trabecular erosion. No acute osseous findings.  Orbits: No acute abnormality.  Brain: No evidence of acute abnormality, such as acute infarction, hemorrhage, hydrocephalus, or mass lesion/mass effect. Stable  ventriculomegaly, likely related to cerebral atrophy. Dilation is most notable in the atrial and occipital regions, morphology stable from 2005.  IMPRESSION: 1. Stable exam.  No evidence of acute intracranial disease. 2. Bilateral mastoid opacification, likely effusions.   Electronically Signed   By: Jorje Guild M.D.   On: 09/09/2013 22:13   Dg Chest Port 1 View  09/09/2013   CLINICAL DATA:  Confusion, history hypertension, atrial fibrillation, chronic kidney disease stage IV  EXAM: PORTABLE CHEST - 1 VIEW  COMPARISON:  Portable exam 1753 hr compared to 06/13/2013  FINDINGS: Mild rotation to the LEFT.  Enlargement of cardiac silhouette.  Atherosclerotic calcification aorta.   Mediastinal contours and pulmonary vascularity otherwise normal.  Lungs clear.  No pleural effusion or pneumothorax.  No acute osseous findings.  IMPRESSION: Enlargement of cardiac silhouette.  No acute abnormalities.   Electronically Signed   By: Lavonia Dana M.D.   On: 09/09/2013 18:01    Scheduled Meds: . ALPRAZolam  1 mg Oral QID  . antiseptic oral rinse  15 mL Mouth Rinse BID  . calcitRIOL  0.5 mcg Oral Daily  . docusate sodium  200 mg Oral QHS  . docusate sodium  300 mg Oral Daily  . famotidine  20 mg Oral Daily  . heparin  5,000 Units Subcutaneous 3 times per day  . labetalol  300 mg Oral BID  . lamoTRIgine  200 mg Oral BID  . levothyroxine  125 mcg Oral QAC breakfast  . lubiprostone  24 mcg Oral BID WC  . Oxcarbazepine  900 mg Oral BID  . polyethylene glycol  17 g Oral Daily  . risperiDONE  4 mg Oral BID  . sevelamer carbonate  800 mg Oral TID WC  . sodium chloride  3 mL Intravenous Q12H   Continuous Infusions: . sodium chloride 75 mL/hr at 09/10/13 0112   Antibiotics Given (last 72 hours)   None      Principal Problem:   Encephalopathy, metabolic Active Problems:   BPLR I, DPRSD, MOST RECENT EPSD, MANIC, SVR   HYPERTENSION   Bradycardia   Acute on chronic renal failure   Pancreatitis, acute   Metabolic acidosis   End stage renal disease   EKG abnormalities   Nausea and vomiting in adult   Altered mental state    Time spent: 35 min    [facility] Pager [phone]. If 7PM-7AM, please contact night-coverage at [email or web address], password [facility] 09/10/2013, 9:03 AM  LOS: 1 day

## 2013-09-11 LAB — CBC
HCT: 25.4 % — ABNORMAL LOW (ref 36.0–46.0)
Hemoglobin: 8.2 g/dL — ABNORMAL LOW (ref 12.0–15.0)
MCH: 33.2 pg (ref 26.0–34.0)
MCHC: 32.3 g/dL (ref 30.0–36.0)
MCV: 102.8 fL — AB (ref 78.0–100.0)
PLATELETS: 153 10*3/uL (ref 150–400)
RBC: 2.47 MIL/uL — ABNORMAL LOW (ref 3.87–5.11)
RDW: 16.8 % — AB (ref 11.5–15.5)
WBC: 4 10*3/uL (ref 4.0–10.5)

## 2013-09-11 LAB — BASIC METABOLIC PANEL
BUN: 83 mg/dL — ABNORMAL HIGH (ref 6–23)
CO2: 20 mEq/L (ref 19–32)
CREATININE: 4.37 mg/dL — AB (ref 0.50–1.10)
Calcium: 9.9 mg/dL (ref 8.4–10.5)
Chloride: 112 mEq/L (ref 96–112)
GFR, EST AFRICAN AMERICAN: 11 mL/min — AB (ref >90)
GFR, EST NON AFRICAN AMERICAN: 10 mL/min — AB (ref >90)
Glucose, Bld: 84 mg/dL (ref 70–99)
Potassium: 4.6 mEq/L (ref 3.7–5.3)
Sodium: 151 mEq/L — ABNORMAL HIGH (ref 137–147)

## 2013-09-11 MED ORDER — SODIUM CHLORIDE 0.45 % IV SOLN
INTRAVENOUS | Status: DC
  Administered 2013-09-11: 75 mL/h via INTRAVENOUS
  Administered 2013-09-12: 01:00:00 via INTRAVENOUS

## 2013-09-11 NOTE — Evaluation (Signed)
Occupational Therapy Evaluation Patient Details Name: [name] MRN: [id number] DOB: August 14, 1945 Today's Date: 09/11/2013    History of Present Illness 68 yo female admitted with hyperkalemia, UTI , bradycardia. PTA lives at [place] with husband Shanon Brow. Pt with recent fall . hx of psychiatric illness (bipolar and schizophrenia)     Clinical Impression   Patient evaluated by Occupational Therapy with no further acute OT needs identified. All education has been completed and the patient has no further questions. See below for any follow-up Occupational Therapy or equipment needs. OT to sign off. Thank you for referral.      Follow Up Recommendations  Home health OT    Equipment Recommendations  3 in 1 bedside comode;Wheelchair cushion (measurements OT);Wheelchair (measurements OT)    Recommendations for Other Services       Precautions / Restrictions Precautions Precautions: Fall      Mobility Bed Mobility Overal bed mobility: Modified Independent             General bed mobility comments: incr time but able to progress to EOB  Transfers Overall transfer level: Needs assistance   Transfers: Sit to/from Stand Sit to Stand: Min assist         General transfer comment: pt completed sit<>stand MIN (A) in stedy. pt could complete same transfer with RW but stedy used to help with transfer to bathroom.    Balance Overall balance assessment: History of Falls Sitting-balance support: Bilateral upper extremity supported;Feet supported Sitting balance-Leahy Scale: Good                                      ADL Overall ADL's : Needs assistance/impaired Eating/Feeding: Set up;Sitting   Grooming: Oral care;Moderate assistance;Standing Grooming Details (indicate cue type and reason): pt standing in stedy for oral care. Pt needed backward chaining to complete task         Upper Body Dressing : Minimal assistance;Bed level Upper Body Dressing Details  (indicate cue type and reason): change gown     Toilet Transfer: Moderate assistance;Regular Toilet (use of stedy to transfer into bathroom )             General ADL Comments: Pt supine on arrival upset because pt dropped the phone. Pt very apprehensive about therapist believing the information being provided and yelling call "call my sister you think I am crazy. she will tell you." OT reassuring patient that she believe the information to be true and we share the same goal of returning to ALF with Shanon Brow. pt much more calm and motivated. Ot did speak with sister via phone. Pt motivated to get OOB into chair to prove ability to return to [facility]. David huband calling patient once pt was in the chair and pt was very excited to be able to answer the phone in a better position     Vision                     Perception Perception Perception Tested?: No   Praxis Praxis Praxis tested?: Not tested    Pertinent Vitals/Pain VSS     Hand Dominance Right   Extremity/Trunk Assessment Upper Extremity Assessment Upper Extremity Assessment: Difficult to assess due to impaired cognition   Lower Extremity Assessment Lower Extremity Assessment: Defer to PT evaluation   Cervical / Trunk Assessment Cervical / Trunk Assessment: Kyphotic   Communication Communication Communication: No difficulties  Cognition Arousal/Alertness: Awake/alert Behavior During Therapy: Restless;Impulsive Overall Cognitive Status: History of cognitive impairments - at baseline       Memory:  (all information reported was accurate this session)             General Comments       Exercises       Shoulder Instructions      Home Living Family/patient expects to be discharged to:: Assisted living                             Home Equipment: Walker - 4 wheels;Wheelchair - manual   Additional Comments: lives at [facility] with husband. Pt has been using a w/c for x1 week per  sister due to s/p fall and regression with mental illness. Sister [name] states "when she has trouble with her schizophrenia or bipolar she wont be able to walk for a few weeks then returns to normal as it gets better. She did fall but there is not physical reason she can't walk at this time its only mental."      Prior Functioning/Environment Level of Independence: Independent with assistive device(s)             OT Diagnosis:     OT Problem List:     OT Treatment/Interventions:      OT Goals(Current goals can be found in the care plan section)    OT Frequency:     Barriers to D/C:            Co-evaluation              End of Session Nurse Communication: Mobility status;Precautions;Other (comment) (use Stedy)  Activity Tolerance: Patient tolerated treatment well Patient left: in chair;with call bell/phone within reach;with chair alarm set;Other (comment) (Rn notified of transfer and positioning)   Time: [phone] OT Time Calculation (min): 29 min Charges:  OT General Charges $OT Visit: 1 Procedure OT Evaluation $Initial OT Evaluation Tier I: 1 Procedure OT Treatments $Self Care/Home Management : 23-37 mins G-Codes:    Peri Maris 19-Sep-2013, 12:06 PM Pager: [phone]

## 2013-09-11 NOTE — Progress Notes (Signed)
PROGRESS NOTE  [name]:[id number] DOB: 1945/11/24 DOA: 09/09/2013 PCP: Woody Seller, MD  Assessment/Plan: Encephalopathy, metabolic- appears to may be have UTI- culture ordered and rocephin given  Renal function is not much worse than usual.   Bradycardia -decreased BB -holding parameters -K improved  Hypernatremia -1/2 NS -encourage free water intake  Elevated lipase- ? Etiology -was supposed to have MRCP last year 6 weeks after d/c last year- monitor as not sure this was done -monitor -watch for pain development with initiation of food  Hyperkalemia -treated with kayexelate  BPLR I, DPRSD, MOST RECENT EPSD, MANIC, SVR   HYPERTENSION    Acute on chronic renal failure -  -nephrology consult  Metabolic acidosis   Nausea and vomiting in adult- resolved  [facility]- from [facility] with husband  Code Status: DNR Family Communication: patient- no family available Disposition Plan: admit   Consultants: nephro  Procedures:      HPI/Subjective: Feeling better this AM Very thirsty  Objective: Filed Vitals:   09/11/13 0703  BP: 154/86  Pulse: 67  Temp: 97.3 F (36.3 C)  Resp: 16    Intake/Output Summary (Last 24 hours) at 09/11/13 0833 Last data filed at 09/10/13 1833  Gross per 24 hour  Intake      0 ml  Output    302 ml  Net   -302 ml   Filed Weights   09/10/13 0417 09/11/13 0703  Weight: 64.8 kg (142 lb 13.7 oz) 65.7 kg (144 lb 13.5 oz)    Exam:   General:  Pleasant/ccoperative, difficult to understand  Cardiovascular: rrr  Respiratory: clear, no wheezing  Abdomen: +BS, soft, tender in suprapubic region  Musculoskeletal: moves all 4 ext, no edmea   Data Reviewed: Basic Metabolic Panel:  Recent Labs Lab 09/09/13 1644 09/09/13 1850 09/10/13 0505 09/11/13 0500  NA 142  --  147 151*  K 6.0*  --  4.9 4.6  CL 103  --  110 112  CO2 17*  --  19 20  GLUCOSE 89 209* 86 84  BUN 96*  --  94* 83*  CREATININE 5.05*   --  4.76* 4.37*  CALCIUM 11.0*  --  10.5 9.9   Liver Function Tests:  Recent Labs Lab 09/09/13 1644 09/10/13 0505  AST 12 13  ALT 13 13  ALKPHOS 102 95  BILITOT 0.2* <0.2*  PROT 7.5 6.9  ALBUMIN 3.9 3.4*    Recent Labs Lab 09/09/13 1956 09/10/13 0505  LIPASE 820* 1732*   No results found for this basename: AMMONIA,  in the last 168 hours CBC:  Recent Labs Lab 09/09/13 1644 09/10/13 0505 09/11/13 0500  WBC 3.1* 3.8* 4.0  NEUTROABS 2.3  --   --   HGB 8.7* 8.4* 8.2*  HCT 26.2* 25.2* 25.4*  MCV 101.9* 101.6* 102.8*  PLT 174 173 153   Cardiac Enzymes:  Recent Labs Lab 09/09/13 1644  TROPONINI <0.30   BNP (last 3 results) No results found for this basename: PROBNP,  in the last 8760 hours CBG:  Recent Labs Lab 09/09/13 2028  GLUCAP 106*    No results found for this or any previous visit (from the past 240 hour(s)).   Studies: Ct Head Wo Contrast  09/09/2013   CLINICAL DATA:  Confusion 08/29/2013  EXAM: CT HEAD WITHOUT CONTRAST  TECHNIQUE: Contiguous axial images were obtained from the base of the skull through the vertex without intravenous contrast.  COMPARISON:  None.  FINDINGS: Skull and Sinuses:Bilateral mastoid opacification, right  more extensive than left. No trabecular erosion. No acute osseous findings.  Orbits: No acute abnormality.  Brain: No evidence of acute abnormality, such as acute infarction, hemorrhage, hydrocephalus, or mass lesion/mass effect. Stable ventriculomegaly, likely related to cerebral atrophy. Dilation is most notable in the atrial and occipital regions, morphology stable from 2005.  IMPRESSION: 1. Stable exam.  No evidence of acute intracranial disease. 2. Bilateral mastoid opacification, likely effusions.   Electronically Signed   By: Jorje Guild M.D.   On: 09/09/2013 22:13   Dg Chest Port 1 View  09/09/2013   CLINICAL DATA:  Confusion, history hypertension, atrial fibrillation, chronic kidney disease stage IV  EXAM: PORTABLE  CHEST - 1 VIEW  COMPARISON:  Portable exam 1753 hr compared to 06/13/2013  FINDINGS: Mild rotation to the LEFT.  Enlargement of cardiac silhouette.  Atherosclerotic calcification aorta.  Mediastinal contours and pulmonary vascularity otherwise normal.  Lungs clear.  No pleural effusion or pneumothorax.  No acute osseous findings.  IMPRESSION: Enlargement of cardiac silhouette.  No acute abnormalities.   Electronically Signed   By: Lavonia Dana M.D.   On: 09/09/2013 18:01    Scheduled Meds: . ALPRAZolam  1 mg Oral QID  . antiseptic oral rinse  15 mL Mouth Rinse BID  . calcitRIOL  0.5 mcg Oral Daily  . cefTRIAXone (ROCEPHIN)  IV  1 g Intravenous Q24H  . docusate sodium  200 mg Oral QHS  . docusate sodium  300 mg Oral Daily  . famotidine  20 mg Oral Daily  . heparin  5,000 Units Subcutaneous 3 times per day  . labetalol  100 mg Oral BID  . lamoTRIgine  200 mg Oral BID  . levothyroxine  125 mcg Oral QAC breakfast  . lubiprostone  24 mcg Oral BID WC  . Oxcarbazepine  900 mg Oral BID  . polyethylene glycol  17 g Oral Daily  . risperiDONE  4 mg Oral BID  . sevelamer carbonate  800 mg Oral TID WC  . sodium chloride  3 mL Intravenous Q12H   Continuous Infusions: . sodium chloride     Antibiotics Given (last 72 hours)   Date/Time Action Medication Dose Rate   09/10/13 1422 Given   cefTRIAXone (ROCEPHIN) 1 g in dextrose 5 % 50 mL IVPB 1 g 100 mL/hr      Principal Problem:   Encephalopathy, metabolic Active Problems:   BPLR I, DPRSD, MOST RECENT EPSD, MANIC, SVR   HYPERTENSION   Bradycardia   Acute on chronic renal failure   Pancreatitis, acute   Metabolic acidosis   End stage renal disease   EKG abnormalities   Nausea and vomiting in adult   Altered mental state    Time spent: 35 min    [facility] Pager [phone]. If 7PM-7AM, please contact night-coverage at [email or web address], password [facility] 09/11/2013, 8:33 AM  LOS: 2 days

## 2013-09-11 NOTE — Progress Notes (Signed)
Assessment:  1 CKD with AKI, improved- near baseline  2 Hyperkalemia resolved  3 Hypertension  4 UTI  5 AMS with hx of psychiatric illness 6 Hypernatremia  Plan:  Agree with hypotonic fluids for hypernatremia.  We will sign off. Thank  you.  Subjective: Interval History: Has awakened mentally  Objective: Vital signs in last 24 hours: Temp:  [97.3 F (36.3 C)-98 F (36.7 C)] 97.3 F (36.3 C) (05/08 0703) Pulse Rate:  [55-67] 67 (05/08 0703) Resp:  [16-18] 16 (05/08 0703) BP: (121-164)/(48-86) 154/86 mmHg (05/08 0703) SpO2:  [94 %-95 %] 95 % (05/08 0703) Weight:  [65.7 kg (144 lb 13.5 oz)] 65.7 kg (144 lb 13.5 oz) (05/08 0703) Weight change:   Intake/Output from previous day: 05/07 0701 - 05/08 0700 In: -  Out: 303 [Urine:300; Stool:3] Intake/Output this shift:    General appearance: alert and cooperative GI: soft, non-tender; bowel sounds normal; no masses,  no organomegaly Extremities: extremities normal, atraumatic, no cyanosis or edema  Lab Results:  Recent Labs  09/10/13 0505 09/11/13 0500  WBC 3.8* 4.0  HGB 8.4* 8.2*  HCT 25.2* 25.4*  PLT 173 153   BMET:  Recent Labs  09/10/13 0505 09/11/13 0500  NA 147 151*  K 4.9 4.6  CL 110 112  CO2 19 20  GLUCOSE 86 84  BUN 94* 83*  CREATININE 4.76* 4.37*  CALCIUM 10.5 9.9   No results found for this basename: PTH,  in the last 72 hours Iron Studies: No results found for this basename: IRON, TIBC, TRANSFERRIN, FERRITIN,  in the last 72 hours Studies/Results: Ct Head Wo Contrast  09/09/2013   CLINICAL DATA:  Confusion 08/29/2013  EXAM: CT HEAD WITHOUT CONTRAST  TECHNIQUE: Contiguous axial images were obtained from the base of the skull through the vertex without intravenous contrast.  COMPARISON:  None.  FINDINGS: Skull and Sinuses:Bilateral mastoid opacification, right more extensive than left. No trabecular erosion. No acute osseous findings.  Orbits: No acute abnormality.  Brain: No evidence of acute  abnormality, such as acute infarction, hemorrhage, hydrocephalus, or mass lesion/mass effect. Stable ventriculomegaly, likely related to cerebral atrophy. Dilation is most notable in the atrial and occipital regions, morphology stable from 2005.  IMPRESSION: 1. Stable exam.  No evidence of acute intracranial disease. 2. Bilateral mastoid opacification, likely effusions.   Electronically Signed   By: Jorje Guild M.D.   On: 09/09/2013 22:13   Dg Chest Port 1 View  09/09/2013   CLINICAL DATA:  Confusion, history hypertension, atrial fibrillation, chronic kidney disease stage IV  EXAM: PORTABLE CHEST - 1 VIEW  COMPARISON:  Portable exam 1753 hr compared to 06/13/2013  FINDINGS: Mild rotation to the LEFT.  Enlargement of cardiac silhouette.  Atherosclerotic calcification aorta.  Mediastinal contours and pulmonary vascularity otherwise normal.  Lungs clear.  No pleural effusion or pneumothorax.  No acute osseous findings.  IMPRESSION: Enlargement of cardiac silhouette.  No acute abnormalities.   Electronically Signed   By: Lavonia Dana M.D.   On: 09/09/2013 18:01   Scheduled: . ALPRAZolam  1 mg Oral QID  . antiseptic oral rinse  15 mL Mouth Rinse BID  . calcitRIOL  0.5 mcg Oral Daily  . cefTRIAXone (ROCEPHIN)  IV  1 g Intravenous Q24H  . docusate sodium  200 mg Oral QHS  . docusate sodium  300 mg Oral Daily  . famotidine  20 mg Oral Daily  . heparin  5,000 Units Subcutaneous 3 times per day  . labetalol  100  mg Oral BID  . lamoTRIgine  200 mg Oral BID  . levothyroxine  125 mcg Oral QAC breakfast  . lubiprostone  24 mcg Oral BID WC  . Oxcarbazepine  900 mg Oral BID  . polyethylene glycol  17 g Oral Daily  . risperiDONE  4 mg Oral BID  . sevelamer carbonate  800 mg Oral TID WC  . sodium chloride  3 mL Intravenous Q12H      LOS: 2 days   [name] 09/11/2013,11:17 AM

## 2013-09-12 DIAGNOSIS — D539 Nutritional anemia, unspecified: Secondary | ICD-10-CM | POA: Diagnosis present

## 2013-09-12 DIAGNOSIS — N390 Urinary tract infection, site not specified: Principal | ICD-10-CM

## 2013-09-12 LAB — CBC
HCT: 24.9 % — ABNORMAL LOW (ref 36.0–46.0)
Hemoglobin: 8.1 g/dL — ABNORMAL LOW (ref 12.0–15.0)
MCH: 33.9 pg (ref 26.0–34.0)
MCHC: 32.5 g/dL (ref 30.0–36.0)
MCV: 104.2 fL — ABNORMAL HIGH (ref 78.0–100.0)
Platelets: 138 10*3/uL — ABNORMAL LOW (ref 150–400)
RBC: 2.39 MIL/uL — ABNORMAL LOW (ref 3.87–5.11)
RDW: 16.7 % — ABNORMAL HIGH (ref 11.5–15.5)
WBC: 5.1 10*3/uL (ref 4.0–10.5)

## 2013-09-12 LAB — URINALYSIS, ROUTINE W REFLEX MICROSCOPIC
Bilirubin Urine: NEGATIVE
Glucose, UA: NEGATIVE mg/dL
Ketones, ur: NEGATIVE mg/dL
Nitrite: NEGATIVE
Protein, ur: 100 mg/dL — AB
Specific Gravity, Urine: 1.011 (ref 1.005–1.030)
Urobilinogen, UA: 0.2 mg/dL (ref 0.0–1.0)
pH: 6.5 (ref 5.0–8.0)

## 2013-09-12 LAB — BASIC METABOLIC PANEL
BUN: 73 mg/dL — ABNORMAL HIGH (ref 6–23)
CHLORIDE: 109 meq/L (ref 96–112)
CO2: 19 mEq/L (ref 19–32)
CREATININE: 3.99 mg/dL — AB (ref 0.50–1.10)
Calcium: 8.9 mg/dL (ref 8.4–10.5)
GFR, EST AFRICAN AMERICAN: 13 mL/min — AB (ref >90)
GFR, EST NON AFRICAN AMERICAN: 11 mL/min — AB (ref >90)
Glucose, Bld: 69 mg/dL — ABNORMAL LOW (ref 70–99)
Potassium: 4.7 mEq/L (ref 3.7–5.3)
Sodium: 144 mEq/L (ref 137–147)

## 2013-09-12 LAB — URINE MICROSCOPIC-ADD ON

## 2013-09-12 LAB — VITAMIN B12: Vitamin B-12: 494 pg/mL (ref 211–911)

## 2013-09-12 LAB — TSH: TSH: 0.049 u[IU]/mL — ABNORMAL LOW (ref 0.350–4.500)

## 2013-09-12 LAB — LIPASE, BLOOD: Lipase: 876 U/L — ABNORMAL HIGH (ref 11–59)

## 2013-09-12 MED ORDER — POLYETHYLENE GLYCOL 3350 17 G PO PACK
17.0000 g | PACK | Freq: Every day | ORAL | Status: DC | PRN
  Filled 2013-09-12: qty 1

## 2013-09-12 MED ORDER — CEFUROXIME AXETIL 500 MG PO TABS: 500.0000 mg | ORAL_TABLET | Freq: Two times a day (BID) | ORAL | Status: DC

## 2013-09-12 NOTE — Progress Notes (Signed)
CSW (Clinical Education officer, museum) received call from MD asking if pt was able to dc today. CSW called facility and spoke with [facility]. Per facility, their pharmacy is not open on weekends so they are only able to accept pt back if there are no medication changes. CSW notified MD of this. Plan will be for pt to dc Monday as pt will need new antibiotic at dc.  [place], [place]

## 2013-09-12 NOTE — Progress Notes (Addendum)
Current and old chart reviewed.    PROGRESS NOTE  [name] GHW:[id number] DOB: 02/22/1946 DOA: 09/09/2013 PCP: Woody Seller, MD  HPI/Subjective: Wants to go home. No nausea vomiting or pain.  Objective: Filed Vitals:   09/11/13 2041  BP: 150/70  Pulse: 82  Temp: 98 F (36.7 C)  Resp: 16    Intake/Output Summary (Last 24 hours) at 09/12/13 0900 Last data filed at 09/12/13 0713  Gross per 24 hour  Intake      0 ml  Output   1201 ml  Net  -1201 ml   Filed Weights   09/10/13 0417 09/11/13 0703 09/11/13 2041  Weight: 64.8 kg (142 lb 13.7 oz) 65.7 kg (144 lb 13.5 oz) 64.5 kg (142 lb 3.2 oz)    Exam:   General:  Talking on the phone. Alert oriented and appropriate  Back: Severe kyphosis and deformity  Cardiovascular: rrr without murmurs gallops rubs  Respiratory: clear, no wheezing  Abdomen: +BS, soft, nontender  Musculoskeletal: moves all 4 ext, no edmea   Data Reviewed: Basic Metabolic Panel:  Recent Labs Lab 09/09/13 1644 09/09/13 1850 09/10/13 0505 09/11/13 0500 09/12/13 0428  NA 142  --  147 151* 144  K 6.0*  --  4.9 4.6 4.7  CL 103  --  110 112 109  CO2 17*  --  19 20 19   GLUCOSE 89 209* 86 84 69*  BUN 96*  --  94* 83* 73*  CREATININE 5.05*  --  4.76* 4.37* 3.99*  CALCIUM 11.0*  --  10.5 9.9 8.9   Liver Function Tests:  Recent Labs Lab 09/09/13 1644 09/10/13 0505  AST 12 13  ALT 13 13  ALKPHOS 102 95  BILITOT 0.2* <0.2*  PROT 7.5 6.9  ALBUMIN 3.9 3.4*    Recent Labs Lab 09/09/13 1956 09/10/13 0505  LIPASE 820* 1732*   No results found for this basename: AMMONIA,  in the last 168 hours CBC:  Recent Labs Lab 09/09/13 1644 09/10/13 0505 09/11/13 0500 09/12/13 0428  WBC 3.1* 3.8* 4.0 5.1  NEUTROABS 2.3  --   --   --   HGB 8.7* 8.4* 8.2* 8.1*  HCT 26.2* 25.2* 25.4* 24.9*  MCV 101.9* 101.6* 102.8* 104.2*  PLT 174 173 153 138*   Cardiac Enzymes:  Recent Labs Lab 09/09/13 1644  TROPONINI <0.30   BNP (last 3  results) No results found for this basename: PROBNP,  in the last 8760 hours CBG:  Recent Labs Lab 09/09/13 2028  GLUCAP 106*    No results found for this or any previous visit (from the past 240 hour(s)).   Studies: No results found.  Scheduled Meds: . ALPRAZolam  1 mg Oral QID  . antiseptic oral rinse  15 mL Mouth Rinse BID  . calcitRIOL  0.5 mcg Oral Daily  . cefTRIAXone (ROCEPHIN)  IV  1 g Intravenous Q24H  . docusate sodium  200 mg Oral QHS  . docusate sodium  300 mg Oral Daily  . famotidine  20 mg Oral Daily  . heparin  5,000 Units Subcutaneous 3 times per day  . labetalol  100 mg Oral BID  . lamoTRIgine  200 mg Oral BID  . levothyroxine  125 mcg Oral QAC breakfast  . lubiprostone  24 mcg Oral BID WC  . Oxcarbazepine  900 mg Oral BID  . polyethylene glycol  17 g Oral Daily  . risperiDONE  4 mg Oral BID  . sevelamer carbonate  800 mg Oral  TID WC  . sodium chloride  3 mL Intravenous Q12H   Continuous Infusions: . sodium chloride 75 mL/hr at 09/12/13 0039   Antibiotics Given (last 72 hours)   Date/Time Action Medication Dose Rate   09/10/13 1422 Given   cefTRIAXone (ROCEPHIN) 1 g in dextrose 5 % 50 mL IVPB 1 g 100 mL/hr   09/11/13 1126 Given   cefTRIAXone (ROCEPHIN) 1 g in dextrose 5 % 50 mL IVPB 1 g 100 mL/hr     Patient's mental status, and creatinine back to baseline.  Sodium normal. Denies abdominal pain nausea vomiting dysuria. She wants to go home. I have a call to the social worker to see whether the facility will accept her back on a Saturday. Patient has chronically elevated lipases, but not to this extent. Clinically no evidence of pancreatitis currently. It does not appear that she ever had an MRCP. If she stays over the weekend, will order. If not, may do as an outpatient. Would treat UTI for 2 more days.    Doree Barthel, M.D.  Addendum: Discussed with social worker: As patient has need for 2 more days of antibiotic, facility is unable to take  patient back. never had a culture drawn on admission.  Will order repeat UA, C&S.  Recommend MRCP. Patient refuses at this time.  Saline lock. D/c telemetry. Macrocytic anemia: check B12 and folate. Check TSH  Doree Barthel, M.D.

## 2013-09-13 DIAGNOSIS — D539 Nutritional anemia, unspecified: Secondary | ICD-10-CM

## 2013-09-13 DIAGNOSIS — E039 Hypothyroidism, unspecified: Secondary | ICD-10-CM

## 2013-09-13 LAB — FOLATE RBC: RBC Folate: 824 ng/mL — ABNORMAL HIGH (ref >280)

## 2013-09-13 LAB — BASIC METABOLIC PANEL
BUN: 74 mg/dL — ABNORMAL HIGH (ref 6–23)
CALCIUM: 9.5 mg/dL (ref 8.4–10.5)
CO2: 18 mEq/L — ABNORMAL LOW (ref 19–32)
Chloride: 108 mEq/L (ref 96–112)
Creatinine, Ser: 4.05 mg/dL — ABNORMAL HIGH (ref 0.50–1.10)
GFR calc Af Amer: 12 mL/min — ABNORMAL LOW (ref >90)
GFR, EST NON AFRICAN AMERICAN: 11 mL/min — AB (ref >90)
Glucose, Bld: 74 mg/dL (ref 70–99)
Potassium: 4.6 mEq/L (ref 3.7–5.3)
SODIUM: 144 meq/L (ref 137–147)

## 2013-09-13 MED ORDER — LEVOTHYROXINE SODIUM 100 MCG PO TABS
100.0000 ug | ORAL_TABLET | Freq: Every day | ORAL | Status: DC
  Administered 2013-09-14: 100 ug via ORAL
  Filled 2013-09-13 (×2): qty 1

## 2013-09-13 NOTE — Progress Notes (Signed)
Current and old chart reviewed.    PROGRESS NOTE  DELESIA MARTINEK ZOX:[id number] DOB: [birth year] DOA: 09/09/2013 PCP: Woody Seller, MD  A/P Principal Problem:   Encephalopathy, metabolic Active Problems:   HYPOTHYROIDISM   BPLR I, DPRSD, MOST RECENT EPSD, MANIC, SVR   HYPERTENSION   CONSTIPATION, CHRONIC   Bradycardia   UTI (urinary tract infection)   Acute on chronic renal failure   Pancreatitis, acute   Metabolic acidosis   End stage renal disease   EKG abnormalities   Nausea and vomiting in adult   Macrocytic anemia  Repeat UA and retrieved on Rocephin. Will continue until discharge. Urine culture not drawn on admission.  Drawn yesterday, but has been on Rocephin. TSH low. We will decrease Synthroid 200 mcg a day. B12 normal. RBC folate pending. No clinical evidence of pancreatitis. Patient continues to refuse MRCP. Back to assisted living tomorrow, as they are unable to take her back on the weekend.  HPI/Subjective: No new complaints. Denies nausea vomiting or abdominal pain.  Objective: Filed Vitals:   09/13/13 0316  BP: 149/68  Pulse: 65  Temp: 98.9 F (37.2 C)  Resp: 18    Intake/Output Summary (Last 24 hours) at 09/13/13 1155 Last data filed at 09/13/13 1022  Gross per 24 hour  Intake    360 ml  Output   1001 ml  Net   -641 ml   Filed Weights   09/11/13 0703 09/11/13 2041 09/13/13 0316  Weight: 65.7 kg (144 lb 13.5 oz) 64.5 kg (142 lb 3.2 oz) 67.2 kg (148 lb 2.4 oz)    Exam:   General:  Comfortable in bed. Calm and cooperative.    Cardiovascular: rrr without murmurs gallops rubs  Respiratory: clear, no wheezing  Abdomen: +BS, soft, nontender  Musculoskeletal: moves all 4 ext, no edmea   Data Reviewed: Basic Metabolic Panel:  Recent Labs Lab 09/09/13 1644 09/09/13 1850 09/10/13 0505 09/11/13 0500 09/12/13 0428 09/13/13 0341  NA 142  --  147 151* 144 144  K 6.0*  --  4.9 4.6 4.7 4.6  CL 103  --  110 112 109 108  CO2 17*  --  19  20 19  18*  GLUCOSE 89 209* 86 84 69* 74  BUN 96*  --  94* 83* 73* 74*  CREATININE 5.05*  --  4.76* 4.37* 3.99* 4.05*  CALCIUM 11.0*  --  10.5 9.9 8.9 9.5   Liver Function Tests:  Recent Labs Lab 09/09/13 1644 09/10/13 0505  AST 12 13  ALT 13 13  ALKPHOS 102 95  BILITOT 0.2* <0.2*  PROT 7.5 6.9  ALBUMIN 3.9 3.4*    Recent Labs Lab 09/09/13 1956 09/10/13 0505 09/12/13 1027  LIPASE 820* 1732* 876*   No results found for this basename: AMMONIA,  in the last 168 hours CBC:  Recent Labs Lab 09/09/13 1644 09/10/13 0505 09/11/13 0500 09/12/13 0428  WBC 3.1* 3.8* 4.0 5.1  NEUTROABS 2.3  --   --   --   HGB 8.7* 8.4* 8.2* 8.1*  HCT 26.2* 25.2* 25.4* 24.9*  MCV 101.9* 101.6* 102.8* 104.2*  PLT 174 173 153 138*   Cardiac Enzymes:  Recent Labs Lab 09/09/13 1644  TROPONINI <0.30   BNP (last 3 results) No results found for this basename: PROBNP,  in the last 8760 hours CBG:  Recent Labs Lab 09/09/13 2028  GLUCAP 106*    No results found for this or any previous visit (from the past 240 hour(s)).   Studies:  No results found.  Scheduled Meds: . ALPRAZolam  1 mg Oral QID  . antiseptic oral rinse  15 mL Mouth Rinse BID  . calcitRIOL  0.5 mcg Oral Daily  . cefTRIAXone (ROCEPHIN)  IV  1 g Intravenous Q24H  . docusate sodium  200 mg Oral QHS  . docusate sodium  300 mg Oral Daily  . famotidine  20 mg Oral Daily  . heparin  5,000 Units Subcutaneous 3 times per day  . labetalol  100 mg Oral BID  . lamoTRIgine  200 mg Oral BID  . [START ON 09/14/2013] levothyroxine  100 mcg Oral QAC breakfast  . lubiprostone  24 mcg Oral BID WC  . Oxcarbazepine  900 mg Oral BID  . risperiDONE  4 mg Oral BID  . sevelamer carbonate  800 mg Oral TID WC  . sodium chloride  3 mL Intravenous Q12H   Continuous Infusions:   Antibiotics Given (last 72 hours)   Date/Time Action Medication Dose Rate   09/10/13 1422 Given   cefTRIAXone (ROCEPHIN) 1 g in dextrose 5 % 50 mL IVPB 1 g 100  mL/hr   09/11/13 1126 Given   cefTRIAXone (ROCEPHIN) 1 g in dextrose 5 % 50 mL IVPB 1 g 100 mL/hr   09/12/13 0943 Given   cefTRIAXone (ROCEPHIN) 1 g in dextrose 5 % 50 mL IVPB 1 g 100 mL/hr   09/13/13 1005 Given   cefTRIAXone (ROCEPHIN) 1 g in dextrose 5 % 50 mL IVPB 1 g 100 mL/hr     Doree Barthel, M.D.

## 2013-09-14 DIAGNOSIS — N184 Chronic kidney disease, stage 4 (severe): Secondary | ICD-10-CM

## 2013-09-14 LAB — CBC
HCT: 24.7 % — ABNORMAL LOW (ref 36.0–46.0)
HEMOGLOBIN: 7.8 g/dL — AB (ref 12.0–15.0)
MCH: 33.2 pg (ref 26.0–34.0)
MCHC: 31.6 g/dL (ref 30.0–36.0)
MCV: 105.1 fL — ABNORMAL HIGH (ref 78.0–100.0)
PLATELETS: 133 10*3/uL — AB (ref 150–400)
RBC: 2.35 MIL/uL — AB (ref 3.87–5.11)
RDW: 16.9 % — ABNORMAL HIGH (ref 11.5–15.5)
WBC: 5 10*3/uL (ref 4.0–10.5)

## 2013-09-14 LAB — BASIC METABOLIC PANEL
BUN: 73 mg/dL — ABNORMAL HIGH (ref 6–23)
CHLORIDE: 108 meq/L (ref 96–112)
CO2: 17 meq/L — AB (ref 19–32)
Calcium: 9.7 mg/dL (ref 8.4–10.5)
Creatinine, Ser: 4.22 mg/dL — ABNORMAL HIGH (ref 0.50–1.10)
GFR calc Af Amer: 12 mL/min — ABNORMAL LOW (ref >90)
GFR calc non Af Amer: 10 mL/min — ABNORMAL LOW (ref >90)
GLUCOSE: 84 mg/dL (ref 70–99)
POTASSIUM: 4.9 meq/L (ref 3.7–5.3)
SODIUM: 143 meq/L (ref 137–147)

## 2013-09-14 LAB — LIPASE, BLOOD: LIPASE: 779 U/L — AB (ref 11–59)

## 2013-09-14 MED ORDER — LEVOTHYROXINE SODIUM 100 MCG PO TABS: 100.0000 ug | ORAL_TABLET | Freq: Every day | ORAL | Status: DC

## 2013-09-14 MED ORDER — DARBEPOETIN ALFA-POLYSORBATE 40 MCG/0.4ML IJ SOLN
40.0000 ug | Freq: Once | INTRAMUSCULAR | Status: AC
  Administered 2013-09-14: 40 ug via SUBCUTANEOUS
  Filled 2013-09-14: qty 0.4

## 2013-09-14 MED ORDER — CEFUROXIME AXETIL 500 MG PO TABS: 500.0000 mg | ORAL_TABLET | Freq: Two times a day (BID) | ORAL | Status: DC

## 2013-09-14 MED ORDER — LABETALOL HCL 100 MG PO TABS: 100.0000 mg | ORAL_TABLET | Freq: Two times a day (BID) | ORAL | Status: AC

## 2013-09-14 NOTE — Discharge Summary (Signed)
Physician Discharge Summary  [name] [id number] DOB: 1946-01-19 DOA: 09/09/2013  PCP: Woody Seller, MD  Admit date: 09/09/2013 Discharge date: 09/14/2013  Time spent: greater than 30 minutes  Recommendations for Outpatient Follow-up:  1. Monitor BMET, H/H 2. MRCP if patient will consent 3. TSH in 2 months  Discharge Diagnoses:  Principal Problem:   Encephalopathy, toxic Active Problems:   HYPOTHYROIDISM, with low TSH, syntroid dose decreased   Bipolar disorder   HYPERTENSION   CONSTIPATION, CHRONIC   Bradycardia   Chronic kidney disease (CKD), stage IV (severe)   UTI (urinary tract infection)   Acute on chronic renal failure   Pancreatitis, acute   Metabolic acidosis   EKG abnormalities   Nausea and vomiting in adult   Macrocytic anemia  Discharge Condition: stable  Code status: DNR  Filed Weights   09/11/13 2041 09/13/13 0316 09/14/13 0540  Weight: 64.5 kg (142 lb 3.2 oz) 67.2 kg (148 lb 2.4 oz) 66.8 kg (147 lb 4.3 oz)    History of present illness:  68 yo female h/o esrd (has refused dialysis), bipolar disorder, htn, afib, lives in rest home sent in for confusion. Pt was initially confused on arrival with no focal neuro deficits, and her mental status has normalized since arrival. No fevers. No cough. She has had several episodes of n/v today nonbloody. She denies any abd pain. She says they sent her here "to die". No diarrhea. No cp. No swelling. She feels back to normal.  Workup showed UTI, potassium of 6.0, creatinine of 5.5.  Hospital Course:  Patient was admitted to hospital list. Started on IV fluid, Rocephin. Nephrology consulted. Mental status at baseline.  Principal Problem:   Encephalopathy, toxic. Likely related to UTI, resolved. Active Problems:   HYPOTHYROIDISM:  TSH was low at 0.049, so Synthroid dose decreased from 125 mcg a day 200 mcg a day.   BPLR I, DPRSD, MOST RECENT EPSD, MANIC, SVR: Remained stable. Patient has refused MRCP, but  otherwise largely cooperative and compliant.   HYPERTENSION   CONSTIPATION, CHRONIC   Bradycardia:  On admission, heart rate dropped into the 30s and 40s. Labetalol decreased from 300 mg twice daily 200 mg twice daily with improvement in her heart rate. She is currently at about 70 beats per minute.   Chronic kidney disease (CKD), stage IV (severe): Patient has a history of chronic kidney disease ranging about 3-1/2-4-1/2. She was given IV fluids and medical therapy for hyperkalemia. Nephrology consulted. In the past she has declined dialysis. She had no indication for dialysis during this hospitalization and nephrology has signed off.  Creatinine ranging about 4-4.5 with normal potassium.   UTI (urinary tract infection): Started on Rocephin on admission. Urine culture ordered but not done on admission. Repeat UA shows improvement in white cells and bacteria. Urine culture while on Rocephin is growing 75,000 colonies of enterococcus. This is likely colonization.   Acute on chronic renal failure   Pancreatitis, acute: Patient had no abdominal pain, but lipase was checked and noted to be high.. In the past she has had chronically elevated lipases, but not to this extent, above 1000. She has had no further vomiting and clinical significance is uncertain. She does not drink alcohol and is status post cholecystectomy. Triglyceride level was not high in the past. She was to have an MRCP as an outpatient. It is unclear whether this was done.  MRCP was ordered but patient refused. Should she reconsider, this can be done as an outpatient. Patient  remains asymptomatic despite continued elevation in lipase.   Metabolic acidosis, mild, secondary to renal failure   EKG abnormalities, no evidence of ACS.   Nausea and vomiting in adult has resolved. Tolerating solid food.   Macrocytic anemia: B12 and folate normal. No evidence of bleeding. Likely a component of renal anemia. Given Aranesp. Did not require  transfusion. Hyperkalemia resolved with medical therapy. CODE STATUS was DO NOT RESUSCITATE on admission and this will be continued Paroxysmal atrial fibrillation:  Patient's initial EKG showed atrial fibrillation which is apparently a new problem for her. On telemetry she converted to sinus rhythm. Not a candidate for anticoagulation due to fall risk. Echocardiogram in 2014 showed preserved ejection fraction.   Procedures:  None   Consultations:  Nephrology   Discharge Exam: Filed Vitals:   09/14/13 0500  BP: 144/75  Pulse: 75  Temp: 98.6 F (37 C)  Resp: 18    General: alert, oriented  HEENT: Moist mucous membranes  Cardiovascular: regular rate rhythm without murmurs gallops rubs  Respiratory: clear to auscultation bilaterally without wheezes rhonchi or rales   abdomen: Soft nontender nondistended Extremities: No clubbing cyanosis or edema  Discharge Orders   Future Orders Complete By Expires   Diet - low sodium heart healthy  As directed    Walk with assistance  As directed        Medication List    STOP taking these medications       furosemide 40 MG tablet  Commonly known as:  LASIX      TAKE these medications       acetaminophen 500 MG tablet  Commonly known as:  TYLENOL  Take 500 mg by mouth every 4 (four) hours as needed for moderate pain.     ALPRAZolam 1 MG tablet  Commonly known as:  XANAX  Take 1 mg by mouth 4 (four) times daily.     ALPRAZolam 1 MG tablet  Commonly known as:  XANAX  Take 1 mg by mouth at bedtime as needed for anxiety or sleep.     calcitRIOL 0.25 MCG capsule  Commonly known as:  ROCALTROL  Take 0.5 mcg by mouth daily.     cefUROXime 500 MG tablet  Commonly known as:  CEFTIN  Take 1 tablet (500 mg total) by mouth 2 (two) times daily with a meal.     Cranberry 425 MG Caps  Take 1 capsule by mouth 2 (two) times daily.     cyclobenzaprine 5 MG tablet  Commonly known as:  FLEXERIL  Take 5 mg by mouth 3 (three) times  daily as needed for muscle spasms.     docusate sodium 100 MG capsule  Commonly known as:  COLACE  Take 200-300 mg by mouth 2 (two) times daily. 3 caps in am, 2 caps in pm     epoetin alfa 20000 UNIT/ML injection  Commonly known as:  EPOGEN,PROCRIT  Inject 20,000 Units into the skin every 14 (fourteen) days.     ergocalciferol 50000 UNITS capsule  Commonly known as:  VITAMIN D2  Take 50,000 Units by mouth once a week. On thursday     famotidine 20 MG tablet  Commonly known as:  PEPCID  Take 1 tablet (20 mg total) by mouth daily.     guaifenesin 100 MG/5ML syrup  Commonly known as:  ROBITUSSIN  Take 200 mg by mouth 4 (four) times daily as needed for cough.     labetalol 300 MG tablet  Commonly known as:  NORMODYNE  Take 1 tablet (300 mg total) by mouth 2 (two) times daily.     lamoTRIgine 200 MG tablet  Commonly known as:  LAMICTAL  Take 200 mg by mouth 2 (two) times daily.     levothyroxine 100 MCG tablet  Commonly known as:  SYNTHROID, LEVOTHROID  Take 1 tablet (100 mcg total) by mouth daily before breakfast.     lubiprostone 24 MCG capsule  Commonly known as:  AMITIZA  Take 24 mcg by mouth 2 (two) times daily with a meal.     magnesium hydroxide 400 MG/5ML suspension  Commonly known as:  MILK OF MAGNESIA  Take 30 mLs by mouth daily as needed for constipation.     meclizine 25 MG tablet  Commonly known as:  ANTIVERT  Take 25 mg by mouth 3 (three) times daily as needed for dizziness.     Oxcarbazepine 300 MG tablet  Commonly known as:  TRILEPTAL  Take 900 mg by mouth 2 (two) times daily.     polyethylene glycol packet  Commonly known as:  MIRALAX / GLYCOLAX  Take 17 g by mouth daily.     risperiDONE 2 MG tablet  Commonly known as:  RISPERDAL  Take 4 mg by mouth 2 (two) times daily.     sevelamer carbonate 800 MG tablet  Commonly known as:  RENVELA  Take 1 tablet (800 mg total) by mouth 3 (three) times daily with meals.     sodium phosphate enema   Commonly known as:  FLEET  Place 1 enema rectally as needed (for constipation, may self-administer.).       No Known Allergies     Follow-up Information   Follow up with Woody Seller, MD In 1 week.   Specialty:  Family Medicine   Contact information:   [address] [place] [address] [place] [address] [phone]        The results of significant diagnostics from this hospitalization (including imaging, microbiology, ancillary and laboratory) are listed below for reference.    Significant Diagnostic Studies: Dg Shoulder Right  08/29/2013   CLINICAL DATA:  Status post fall; generalized right shoulder pain.  EXAM: RIGHT SHOULDER - 2+ VIEW  COMPARISON:  Chest radiograph from 06/13/2013  FINDINGS: There is no evidence of fracture or dislocation. The right humeral head is seated within the glenoid fossa. The acromioclavicular joint is unremarkable in appearance. No significant soft tissue abnormalities are seen. The visualized portions of the right lung are clear.  IMPRESSION: No evidence of fracture or dislocation.   Electronically Signed   By: Garald Balding M.D.   On: 08/29/2013 04:51   Ct Head Wo Contrast  09/09/2013   CLINICAL DATA:  Confusion 08/29/2013  EXAM: CT HEAD WITHOUT CONTRAST  TECHNIQUE: Contiguous axial images were obtained from the base of the skull through the vertex without intravenous contrast.  COMPARISON:  None.  FINDINGS: Skull and Sinuses:Bilateral mastoid opacification, right more extensive than left. No trabecular erosion. No acute osseous findings.  Orbits: No acute abnormality.  Brain: No evidence of acute abnormality, such as acute infarction, hemorrhage, hydrocephalus, or mass lesion/mass effect. Stable ventriculomegaly, likely related to cerebral atrophy. Dilation is most notable in the atrial and occipital regions, morphology stable from 2005.  IMPRESSION: 1. Stable exam.  No evidence of acute intracranial disease. 2. Bilateral mastoid opacification, likely  effusions.   Electronically Signed   By: Jorje Guild M.D.   On: 09/09/2013 22:13   Ct Head Wo Contrast  08/29/2013  CLINICAL DATA:  Status post fall; concern for head or cervical spine injury.  EXAM: CT HEAD WITHOUT CONTRAST  CT CERVICAL SPINE WITHOUT CONTRAST  TECHNIQUE: Multidetector CT imaging of the head and cervical spine was performed following the standard protocol without intravenous contrast. Multiplanar CT image reconstructions of the cervical spine were also generated.  COMPARISON:  CT of the head performed 11/09/2012, and CT of the cervical spine performed 10/21/2012  FINDINGS: CT HEAD FINDINGS  There is no evidence of acute infarction, mass lesion, or intra- or extra-axial hemorrhage on CT.  Stable ventriculomegaly is again noted; this may reflect mild to moderate cortical volume loss, though normal-pressure hydrocephalus cannot be entirely excluded. Mild periventricular white matter change likely reflects small vessel ischemic microangiopathy. Cerebellar atrophy is noted.  The posterior fossa, including the cerebellum, brainstem and fourth ventricle, is within normal limits. The third and lateral ventricles, and basal ganglia are unremarkable in appearance. The cerebral hemispheres are symmetric in appearance, with normal gray-white differentiation. No mass effect or midline shift is seen.  There is no evidence of fracture; visualized osseous structures are unremarkable in appearance. The visualized portions of the orbits are within normal limits. There is complete opacification of the right mastoid air cells. The paranasal sinuses and left mastoid air cells are well-aerated. No significant soft tissue abnormalities are seen.  CT CERVICAL SPINE FINDINGS  There is no evidence of fracture or subluxation. Vertebral bodies demonstrate normal height and alignment. Prominent anterior osteophytes are seen along the cervical spine. Intervertebral disc spaces are preserved. Prevertebral soft tissues are  within normal limits. The visualized neural foramina are grossly unremarkable.  Scattered calcified foci are seen within the thyroid gland. Mild emphysematous change is noted at the lung apices. No significant soft tissue abnormalities are seen.  IMPRESSION: 1. No evidence of traumatic intracranial injury or fracture. 2. No evidence of fracture or subluxation along the cervical spine. 3. Stable ventriculomegaly again seen; this may reflect mild to moderate cortical volume loss, though normal-pressure hydrocephalus cannot be entirely excluded. Would correlate for associated symptoms, as on prior studies. 4. Mild small vessel ischemic microangiopathy. 5. Complete opacification of the right mastoid air cells. 6. Mild emphysematous change at the lung apices.   Electronically Signed   By: Garald Balding M.D.   On: 08/29/2013 04:48   Ct Cervical Spine Wo Contrast  08/29/2013   CLINICAL DATA:  Status post fall; concern for head or cervical spine injury.  EXAM: CT HEAD WITHOUT CONTRAST  CT CERVICAL SPINE WITHOUT CONTRAST  TECHNIQUE: Multidetector CT imaging of the head and cervical spine was performed following the standard protocol without intravenous contrast. Multiplanar CT image reconstructions of the cervical spine were also generated.  COMPARISON:  CT of the head performed 11/09/2012, and CT of the cervical spine performed 10/21/2012  FINDINGS: CT HEAD FINDINGS  There is no evidence of acute infarction, mass lesion, or intra- or extra-axial hemorrhage on CT.  Stable ventriculomegaly is again noted; this may reflect mild to moderate cortical volume loss, though normal-pressure hydrocephalus cannot be entirely excluded. Mild periventricular white matter change likely reflects small vessel ischemic microangiopathy. Cerebellar atrophy is noted.  The posterior fossa, including the cerebellum, brainstem and fourth ventricle, is within normal limits. The third and lateral ventricles, and basal ganglia are unremarkable in  appearance. The cerebral hemispheres are symmetric in appearance, with normal gray-white differentiation. No mass effect or midline shift is seen.  There is no evidence of fracture; visualized osseous structures are unremarkable in appearance.  The visualized portions of the orbits are within normal limits. There is complete opacification of the right mastoid air cells. The paranasal sinuses and left mastoid air cells are well-aerated. No significant soft tissue abnormalities are seen.  CT CERVICAL SPINE FINDINGS  There is no evidence of fracture or subluxation. Vertebral bodies demonstrate normal height and alignment. Prominent anterior osteophytes are seen along the cervical spine. Intervertebral disc spaces are preserved. Prevertebral soft tissues are within normal limits. The visualized neural foramina are grossly unremarkable.  Scattered calcified foci are seen within the thyroid gland. Mild emphysematous change is noted at the lung apices. No significant soft tissue abnormalities are seen.  IMPRESSION: 1. No evidence of traumatic intracranial injury or fracture. 2. No evidence of fracture or subluxation along the cervical spine. 3. Stable ventriculomegaly again seen; this may reflect mild to moderate cortical volume loss, though normal-pressure hydrocephalus cannot be entirely excluded. Would correlate for associated symptoms, as on prior studies. 4. Mild small vessel ischemic microangiopathy. 5. Complete opacification of the right mastoid air cells. 6. Mild emphysematous change at the lung apices.   Electronically Signed   By: Garald Balding M.D.   On: 08/29/2013 04:48   Dg Chest Port 1 View  09/09/2013   CLINICAL DATA:  Confusion, history hypertension, atrial fibrillation, chronic kidney disease stage IV  EXAM: PORTABLE CHEST - 1 VIEW  COMPARISON:  Portable exam 1753 hr compared to 06/13/2013  FINDINGS: Mild rotation to the LEFT.  Enlargement of cardiac silhouette.  Atherosclerotic calcification aorta.   Mediastinal contours and pulmonary vascularity otherwise normal.  Lungs clear.  No pleural effusion or pneumothorax.  No acute osseous findings.  IMPRESSION: Enlargement of cardiac silhouette.  No acute abnormalities.   Electronically Signed   By: Lavonia Dana M.D.   On: 09/09/2013 18:01   Dg Hand Complete Right  08/29/2013   CLINICAL DATA:  Status post fall; bruising about the right wrist.  EXAM: RIGHT HAND - COMPLETE 3+ VIEW  COMPARISON:  None.  FINDINGS: There is no evidence of fracture or dislocation. The joint spaces are preserved; the soft tissues are unremarkable in appearance. The carpal rows are intact, and demonstrate normal alignment.  IMPRESSION: No evidence of fracture or dislocation.   Electronically Signed   By: Garald Balding M.D.   On: 08/29/2013 04:50   EKG  atrial fibrillation with left bundle branch block   Microbiology: Recent Results (from the past 240 hour(s))  URINE CULTURE     Status: None   Collection Time    09/12/13 10:50 AM      Result Value Ref Range Status   Specimen Description URINE, RANDOM   Final   Special Requests NONE   Final   Culture  Setup Time     Final   Value: 09/12/2013 17:54     Performed at [place]     Final   Value: 65,000 COLONIES/ML     Performed at [facility]   Culture     Final   Value: ENTEROCOCCUS SPECIES     Performed at [facility]   Report Status PENDING   Incomplete     Labs: Basic Metabolic Panel:  Recent Labs Lab 09/10/13 0505 09/11/13 0500 09/12/13 0428 09/13/13 0341 09/14/13 0719  NA 147 151* 144 144 143  K 4.9 4.6 4.7 4.6 4.9  CL 110 112 109 108 108  CO2 19 20 19  18* 17*  GLUCOSE 86 84 69* 74 84  BUN 94* 83* 73* 74* 73*  CREATININE 4.76* 4.37* 3.99* 4.05* 4.22*  CALCIUM 10.5 9.9 8.9 9.5 9.7   Liver Function Tests:  Recent Labs Lab 09/09/13 1644 09/10/13 0505  AST 12 13  ALT 13 13  ALKPHOS 102 95  BILITOT 0.2* <0.2*  PROT 7.5 6.9  ALBUMIN 3.9 3.4*     Recent Labs Lab 09/09/13 1956 09/10/13 0505 09/12/13 1027 09/14/13 0719  LIPASE 820* 1732* 876* 779*   No results found for this basename: AMMONIA,  in the last 168 hours CBC:  Recent Labs Lab 09/09/13 1644 09/10/13 0505 09/11/13 0500 09/12/13 0428 09/14/13 0719  WBC 3.1* 3.8* 4.0 5.1 5.0  NEUTROABS 2.3  --   --   --   --   HGB 8.7* 8.4* 8.2* 8.1* 7.8*  HCT 26.2* 25.2* 25.4* 24.9* 24.7*  MCV 101.9* 101.6* 102.8* 104.2* 105.1*  PLT 174 173 153 138* 133*   Cardiac Enzymes:  Recent Labs Lab 09/09/13 1644  TROPONINI <0.30   BNP: BNP (last 3 results) No results found for this basename: PROBNP,  in the last 8760 hours CBG:  Recent Labs Lab 09/09/13 2028  GLUCAP 106*   Signed:  Delfina Redwood, MD  [facility] 09/14/2013, 10:08 AM

## 2013-09-14 NOTE — Clinical Social Work Note (Signed)
Clinical Social Worker facilitated patient discharge including contacting patient family and facility to confirm patient discharge plans.  Clinical information faxed to facility and family agreeable with plan.  CSW arranged transport via patient sister to [facility].    Clinical Social Worker will sign off for now as social work intervention is no longer needed. Please consult [place] again if new need arises.  Barbette Or, [place]

## 2013-09-15 LAB — URINE CULTURE

## 2013-10-30 ENCOUNTER — Encounter (HOSPITAL_COMMUNITY): Payer: Self-pay | Admitting: Emergency Medicine

## 2013-10-30 ENCOUNTER — Emergency Department (HOSPITAL_COMMUNITY): Payer: Medicare Other

## 2013-10-30 ENCOUNTER — Inpatient Hospital Stay (HOSPITAL_COMMUNITY)
Admission: EM | Admit: 2013-10-30 | Discharge: 2013-11-04 | DRG: 309 | Disposition: A | Discharge status: Skilled Nursing Facility | Payer: Medicare Other | Attending: Pulmonary Disease | Admitting: Pulmonary Disease

## 2013-10-30 DIAGNOSIS — Z8249 Family history of ischemic heart disease and other diseases of the circulatory system: Secondary | ICD-10-CM

## 2013-10-30 DIAGNOSIS — D638 Anemia in other chronic diseases classified elsewhere: Secondary | ICD-10-CM | POA: Diagnosis present

## 2013-10-30 DIAGNOSIS — E875 Hyperkalemia: Secondary | ICD-10-CM | POA: Diagnosis present

## 2013-10-30 DIAGNOSIS — N179 Acute kidney failure, unspecified: Secondary | ICD-10-CM

## 2013-10-30 DIAGNOSIS — R4182 Altered mental status, unspecified: Secondary | ICD-10-CM

## 2013-10-30 DIAGNOSIS — Z87891 Personal history of nicotine dependence: Secondary | ICD-10-CM

## 2013-10-30 DIAGNOSIS — M4000 Postural kyphosis, site unspecified: Secondary | ICD-10-CM | POA: Diagnosis present

## 2013-10-30 DIAGNOSIS — N184 Chronic kidney disease, stage 4 (severe): Secondary | ICD-10-CM

## 2013-10-30 DIAGNOSIS — E785 Hyperlipidemia, unspecified: Secondary | ICD-10-CM | POA: Diagnosis present

## 2013-10-30 DIAGNOSIS — F329 Major depressive disorder, single episode, unspecified: Secondary | ICD-10-CM | POA: Diagnosis present

## 2013-10-30 DIAGNOSIS — I952 Hypotension due to drugs: Secondary | ICD-10-CM

## 2013-10-30 DIAGNOSIS — R001 Bradycardia, unspecified: Secondary | ICD-10-CM

## 2013-10-30 DIAGNOSIS — I9589 Other hypotension: Secondary | ICD-10-CM

## 2013-10-30 DIAGNOSIS — IMO0001 Reserved for inherently not codable concepts without codable children: Secondary | ICD-10-CM

## 2013-10-30 DIAGNOSIS — I959 Hypotension, unspecified: Secondary | ICD-10-CM | POA: Diagnosis present

## 2013-10-30 DIAGNOSIS — N189 Chronic kidney disease, unspecified: Secondary | ICD-10-CM | POA: Diagnosis present

## 2013-10-30 DIAGNOSIS — K219 Gastro-esophageal reflux disease without esophagitis: Secondary | ICD-10-CM | POA: Diagnosis present

## 2013-10-30 DIAGNOSIS — E039 Hypothyroidism, unspecified: Secondary | ICD-10-CM

## 2013-10-30 DIAGNOSIS — E872 Acidosis, unspecified: Secondary | ICD-10-CM | POA: Diagnosis present

## 2013-10-30 DIAGNOSIS — N186 End stage renal disease: Secondary | ICD-10-CM

## 2013-10-30 DIAGNOSIS — R140 Abdominal distension (gaseous): Secondary | ICD-10-CM | POA: Diagnosis present

## 2013-10-30 DIAGNOSIS — G9340 Encephalopathy, unspecified: Secondary | ICD-10-CM

## 2013-10-30 DIAGNOSIS — F319 Bipolar disorder, unspecified: Secondary | ICD-10-CM | POA: Diagnosis present

## 2013-10-30 DIAGNOSIS — I10 Essential (primary) hypertension: Secondary | ICD-10-CM

## 2013-10-30 DIAGNOSIS — R42 Dizziness and giddiness: Secondary | ICD-10-CM | POA: Diagnosis present

## 2013-10-30 DIAGNOSIS — I4891 Unspecified atrial fibrillation: Secondary | ICD-10-CM | POA: Diagnosis present

## 2013-10-30 DIAGNOSIS — D509 Iron deficiency anemia, unspecified: Secondary | ICD-10-CM | POA: Diagnosis present

## 2013-10-30 DIAGNOSIS — I129 Hypertensive chronic kidney disease with stage 1 through stage 4 chronic kidney disease, or unspecified chronic kidney disease: Secondary | ICD-10-CM | POA: Diagnosis present

## 2013-10-30 DIAGNOSIS — T448X5A Adverse effect of centrally-acting and adrenergic-neuron-blocking agents, initial encounter: Secondary | ICD-10-CM | POA: Diagnosis present

## 2013-10-30 DIAGNOSIS — I498 Other specified cardiac arrhythmias: Principal | ICD-10-CM | POA: Diagnosis present

## 2013-10-30 DIAGNOSIS — F3289 Other specified depressive episodes: Secondary | ICD-10-CM | POA: Diagnosis present

## 2013-10-30 DIAGNOSIS — F314 Bipolar disorder, current episode depressed, severe, without psychotic features: Secondary | ICD-10-CM

## 2013-10-30 DIAGNOSIS — F411 Generalized anxiety disorder: Secondary | ICD-10-CM

## 2013-10-30 DIAGNOSIS — K5909 Other constipation: Secondary | ICD-10-CM | POA: Diagnosis present

## 2013-10-30 DIAGNOSIS — M1990 Unspecified osteoarthritis, unspecified site: Secondary | ICD-10-CM | POA: Diagnosis present

## 2013-10-30 DIAGNOSIS — M479 Spondylosis, unspecified: Secondary | ICD-10-CM | POA: Diagnosis present

## 2013-10-30 LAB — BASIC METABOLIC PANEL
BUN: 88 mg/dL — AB (ref 6–23)
CALCIUM: 10.5 mg/dL (ref 8.4–10.5)
CO2: 15 mEq/L — ABNORMAL LOW (ref 19–32)
CREATININE: 4.73 mg/dL — AB (ref 0.50–1.10)
Chloride: 105 mEq/L (ref 96–112)
GFR calc non Af Amer: 9 mL/min — ABNORMAL LOW (ref >90)
GFR, EST AFRICAN AMERICAN: 10 mL/min — AB (ref >90)
Glucose, Bld: 100 mg/dL — ABNORMAL HIGH (ref 70–99)
Potassium: 5.5 mEq/L — ABNORMAL HIGH (ref 3.7–5.3)
Sodium: 138 mEq/L (ref 137–147)

## 2013-10-30 LAB — URINALYSIS, ROUTINE W REFLEX MICROSCOPIC
BILIRUBIN URINE: NEGATIVE
Glucose, UA: NEGATIVE mg/dL
KETONES UR: NEGATIVE mg/dL
NITRITE: NEGATIVE
Protein, ur: 100 mg/dL — AB
Specific Gravity, Urine: 1.013 (ref 1.005–1.030)
UROBILINOGEN UA: 0.2 mg/dL (ref 0.0–1.0)
pH: 6 (ref 5.0–8.0)

## 2013-10-30 LAB — I-STAT CHEM 8, ED
BUN: 99 mg/dL — ABNORMAL HIGH (ref 6–23)
Calcium, Ion: 1.56 mmol/L — ABNORMAL HIGH (ref 1.13–1.30)
Chloride: 113 mEq/L — ABNORMAL HIGH (ref 96–112)
Creatinine, Ser: 5.4 mg/dL — ABNORMAL HIGH (ref 0.50–1.10)
GLUCOSE: 84 mg/dL (ref 70–99)
HEMATOCRIT: 22 % — AB (ref 36.0–46.0)
HEMOGLOBIN: 7.5 g/dL — AB (ref 12.0–15.0)
Potassium: 5.2 mEq/L (ref 3.7–5.3)
SODIUM: 134 meq/L — AB (ref 137–147)
TCO2: 19 mmol/L (ref 0–100)

## 2013-10-30 LAB — MRSA PCR SCREENING: MRSA BY PCR: NEGATIVE

## 2013-10-30 LAB — PREPARE RBC (CROSSMATCH)

## 2013-10-30 LAB — GLUCOSE, CAPILLARY
GLUCOSE-CAPILLARY: 51 mg/dL — AB (ref 70–99)
GLUCOSE-CAPILLARY: 73 mg/dL (ref 70–99)
Glucose-Capillary: 119 mg/dL — ABNORMAL HIGH (ref 70–99)
Glucose-Capillary: 69 mg/dL — ABNORMAL LOW (ref 70–99)
Glucose-Capillary: 82 mg/dL (ref 70–99)

## 2013-10-30 LAB — URINE MICROSCOPIC-ADD ON

## 2013-10-30 LAB — CBC
HEMATOCRIT: 19.5 % — AB (ref 36.0–46.0)
Hemoglobin: 6.4 g/dL — CL (ref 12.0–15.0)
MCH: 33.7 pg (ref 26.0–34.0)
MCHC: 32.8 g/dL (ref 30.0–36.0)
MCV: 102.6 fL — AB (ref 78.0–100.0)
Platelets: 125 10*3/uL — ABNORMAL LOW (ref 150–400)
RBC: 1.9 MIL/uL — ABNORMAL LOW (ref 3.87–5.11)
RDW: 16 % — ABNORMAL HIGH (ref 11.5–15.5)
WBC: 4.8 10*3/uL (ref 4.0–10.5)

## 2013-10-30 LAB — COMPREHENSIVE METABOLIC PANEL
ALT: 12 U/L (ref 0–35)
AST: 10 U/L (ref 0–37)
Albumin: 3.7 g/dL (ref 3.5–5.2)
Alkaline Phosphatase: 90 U/L (ref 39–117)
BUN: 88 mg/dL — AB (ref 6–23)
CALCIUM: 11.4 mg/dL — AB (ref 8.4–10.5)
CO2: 18 mEq/L — ABNORMAL LOW (ref 19–32)
Chloride: 102 mEq/L (ref 96–112)
Creatinine, Ser: 5.03 mg/dL — ABNORMAL HIGH (ref 0.50–1.10)
GFR calc Af Amer: 9 mL/min — ABNORMAL LOW (ref >90)
GFR calc non Af Amer: 8 mL/min — ABNORMAL LOW (ref >90)
Glucose, Bld: 90 mg/dL (ref 70–99)
POTASSIUM: 5.5 meq/L — AB (ref 3.7–5.3)
Sodium: 137 mEq/L (ref 137–147)
TOTAL PROTEIN: 6.7 g/dL (ref 6.0–8.3)
Total Bilirubin: <0.2 mg/dL — ABNORMAL LOW (ref 0.3–1.2)

## 2013-10-30 LAB — MAGNESIUM: Magnesium: 2.5 mg/dL (ref 1.5–2.5)

## 2013-10-30 LAB — CBC WITH DIFFERENTIAL/PLATELET
BASOS ABS: 0 10*3/uL (ref 0.0–0.1)
Basophils Relative: 1 % (ref 0–1)
EOS PCT: 2 % (ref 0–5)
Eosinophils Absolute: 0.1 10*3/uL (ref 0.0–0.7)
HEMATOCRIT: 21.2 % — AB (ref 36.0–46.0)
Hemoglobin: 7.1 g/dL — ABNORMAL LOW (ref 12.0–15.0)
LYMPHS ABS: 0.5 10*3/uL — AB (ref 0.7–4.0)
Lymphocytes Relative: 16 % (ref 12–46)
MCH: 33.6 pg (ref 26.0–34.0)
MCHC: 33.5 g/dL (ref 30.0–36.0)
MCV: 100.5 fL — AB (ref 78.0–100.0)
MONO ABS: 0.2 10*3/uL (ref 0.1–1.0)
Monocytes Relative: 7 % (ref 3–12)
Neutro Abs: 2.2 10*3/uL (ref 1.7–7.7)
Neutrophils Relative %: 74 % (ref 43–77)
Platelets: 144 10*3/uL — ABNORMAL LOW (ref 150–400)
RBC: 2.11 MIL/uL — AB (ref 3.87–5.11)
RDW: 15.6 % — AB (ref 11.5–15.5)
WBC: 3 10*3/uL — ABNORMAL LOW (ref 4.0–10.5)

## 2013-10-30 LAB — TROPONIN I

## 2013-10-30 LAB — TSH: TSH: 0.034 u[IU]/mL — ABNORMAL LOW (ref 0.350–4.500)

## 2013-10-30 LAB — I-STAT CG4 LACTIC ACID, ED: Lactic Acid, Venous: <0.3 mmol/L — ABNORMAL LOW (ref 0.5–2.2)

## 2013-10-30 LAB — PHOSPHORUS: PHOSPHORUS: 5.8 mg/dL — AB (ref 2.3–4.6)

## 2013-10-30 MED ORDER — HEPARIN SODIUM (PORCINE) 5000 UNIT/ML IJ SOLN
5000.0000 [IU] | Freq: Three times a day (TID) | INTRAMUSCULAR | Status: DC
  Administered 2013-10-30 – 2013-11-04 (×16): 5000 [IU] via SUBCUTANEOUS
  Filled 2013-10-30 (×19): qty 1

## 2013-10-30 MED ORDER — SODIUM CHLORIDE 0.9 % IV BOLUS (SEPSIS)
1000.0000 mL | Freq: Once | INTRAVENOUS | Status: AC
Start: 2013-10-30 — End: 2013-10-30
  Administered 2013-10-30: 1000 mL via INTRAVENOUS

## 2013-10-30 MED ORDER — CALCITRIOL 0.5 MCG PO CAPS
0.5000 ug | ORAL_CAPSULE | Freq: Every day | ORAL | Status: DC
Start: 2013-10-30 — End: 2013-11-04
  Administered 2013-10-30 – 2013-11-04 (×6): 0.5 ug via ORAL
  Filled 2013-10-30 (×6): qty 1

## 2013-10-30 MED ORDER — ALPRAZOLAM 0.25 MG PO TABS
0.2500 mg | ORAL_TABLET | Freq: Three times a day (TID) | ORAL | Status: DC
  Administered 2013-10-30 – 2013-11-04 (×23): 0.25 mg via ORAL
  Filled 2013-10-30 (×24): qty 1

## 2013-10-30 MED ORDER — SEVELAMER CARBONATE 800 MG PO TABS
800.0000 mg | ORAL_TABLET | Freq: Three times a day (TID) | ORAL | Status: DC
  Administered 2013-10-30 – 2013-11-04 (×18): 800 mg via ORAL
  Filled 2013-10-30 (×23): qty 1

## 2013-10-30 MED ORDER — DOCUSATE SODIUM 100 MG PO CAPS
200.0000 mg | ORAL_CAPSULE | Freq: Every day | ORAL | Status: DC
  Administered 2013-10-30 – 2013-11-03 (×5): 200 mg via ORAL
  Filled 2013-10-30 (×7): qty 2

## 2013-10-30 MED ORDER — LEVOTHYROXINE SODIUM 100 MCG PO TABS
100.0000 ug | ORAL_TABLET | Freq: Every day | ORAL | Status: DC
  Administered 2013-10-30 – 2013-11-03 (×5): 100 ug via ORAL
  Filled 2013-10-30 (×8): qty 1

## 2013-10-30 MED ORDER — DOCUSATE SODIUM 100 MG PO CAPS
300.0000 mg | ORAL_CAPSULE | Freq: Every day | ORAL | Status: DC
  Administered 2013-10-30 – 2013-11-04 (×4): 300 mg via ORAL
  Filled 2013-10-30 (×7): qty 3

## 2013-10-30 MED ORDER — DEXTROSE 50 % IV SOLN
INTRAVENOUS | Status: AC
  Filled 2013-10-30: qty 50

## 2013-10-30 MED ORDER — GLUCAGON HCL RDNA (DIAGNOSTIC) 1 MG IJ SOLR
1.0000 mg | Freq: Once | INTRAMUSCULAR | Status: AC
  Administered 2013-10-30: 1 mg via INTRAVENOUS
  Filled 2013-10-30: qty 1

## 2013-10-30 MED ORDER — GLUCAGON HCL RDNA (DIAGNOSTIC) 1 MG IJ SOLR
4.0000 mg | Freq: Once | INTRAMUSCULAR | Status: AC
  Administered 2013-10-30: 4 mg via INTRAVENOUS
  Filled 2013-10-30: qty 4

## 2013-10-30 MED ORDER — ATROPINE SULFATE 1 MG/ML IJ SOLN
0.5000 mg | Freq: Once | INTRAMUSCULAR | Status: AC
  Administered 2013-10-30: 0.5 mg via INTRAVENOUS
  Filled 2013-10-30: qty 1

## 2013-10-30 MED ORDER — ATROPINE SULFATE 0.1 MG/ML IJ SOLN
INTRAMUSCULAR | Status: AC
  Administered 2013-10-30: 1 mg
  Filled 2013-10-30: qty 10

## 2013-10-30 MED ORDER — SODIUM BICARBONATE 650 MG PO TABS
650.0000 mg | ORAL_TABLET | Freq: Two times a day (BID) | ORAL | Status: DC
  Administered 2013-10-30 – 2013-11-04 (×11): 650 mg via ORAL
  Filled 2013-10-30 (×12): qty 1

## 2013-10-30 MED ORDER — SODIUM CHLORIDE 0.9 % IV SOLN: 250.0000 mL | INTRAVENOUS | Status: DC | PRN

## 2013-10-30 MED ORDER — SODIUM CHLORIDE 0.9 % IV BOLUS (SEPSIS)
1000.0000 mL | Freq: Once | INTRAVENOUS | Status: AC
  Administered 2013-10-30: 1000 mL via INTRAVENOUS

## 2013-10-30 MED ORDER — SODIUM CHLORIDE 0.9 % IV SOLN
INTRAVENOUS | Status: DC
  Administered 2013-10-30 – 2013-11-04 (×7): via INTRAVENOUS

## 2013-10-30 MED ORDER — DEXTROSE 50 % IV SOLN
25.0000 mL | Freq: Once | INTRAVENOUS | Status: AC | PRN
  Administered 2013-10-30: 25 mL via INTRAVENOUS

## 2013-10-30 MED ORDER — FAMOTIDINE 20 MG PO TABS
20.0000 mg | ORAL_TABLET | Freq: Every day | ORAL | Status: DC
  Administered 2013-10-30 – 2013-11-04 (×6): 20 mg via ORAL
  Filled 2013-10-30 (×7): qty 1

## 2013-10-30 NOTE — ED Provider Notes (Signed)
CSN: [id number]     Arrival date & time 10/30/13  0018 History   First MD Initiated Contact with Patient 10/30/13 0032     Chief Complaint  Patient presents with  . Altered Mental Status     (Consider location/radiation/quality/duration/timing/severity/associated sxs/prior Treatment) HPI 68 year old female presents to emergency room from her nursing facility with complaint of altered mental status.  Limited history available, per EMS patient was not at her baseline.  No other history given.  EMS reports patient was lethargic but would converse with them.  Patient noted to be bradycardic upon arrival with a pulse into the 30s.  Patient reports no chest pain no shortness of breath.  She asks if she is due to have surgery today.  She does not know why she is here in the hospital. Past Medical History  Diagnosis Date  . Constipation   . Bipolar 1 disorder   . Hypertension   . Esophageal reflux   . Anxiety   . Back pain   . Osteoarthritis   . Gout   . Adenomatous polyps 03-27-07  . Diverticulosis   . Internal hemorrhoids   . Hypothyroidism   . Renal insufficiency   . Atrial fibrillation   . CKD (chronic kidney disease), stage IV   . Kyphosis    Past Surgical History  Procedure Laterality Date  . Cholecystectomy    . Appendectomy    . Tonsillectomy    . Tubal ligation     Family History  Problem Relation Age of Onset  . Hypertension Mother   . Heart disease Mother     before age 49  . Hyperlipidemia Mother   . Atrial fibrillation Father   . Colon cancer Neg Hx    History  Substance Use Topics  . Smoking status: Former Smoker    Quit date: 05/08/2003  . Smokeless tobacco: Never Used  . Alcohol Use: No   OB History   Grav Para Term Preterm Abortions TAB SAB Ect Mult Living                 Review of Systems  Level V caveat secondary to altered mental status, confusion  Allergies  Review of patient's allergies indicates no known allergies.  Home Medications    Prior to Admission medications   Medication Sig Start Date End Date Taking? Authorizing Provider  acetaminophen (TYLENOL) 500 MG tablet Take 500 mg by mouth every 4 (four) hours as needed for moderate pain.     Historical Provider, MD  ALPRAZolam Duanne Moron) 1 MG tablet Take 1 mg by mouth 4 (four) times daily.    Historical Provider, MD  ALPRAZolam Duanne Moron) 1 MG tablet Take 1 mg by mouth at bedtime as needed for anxiety or sleep.    Historical Provider, MD  calcitRIOL (ROCALTROL) 0.25 MCG capsule Take 0.5 mcg by mouth daily.    Historical Provider, MD  cefUROXime (CEFTIN) 500 MG tablet Take 1 tablet (500 mg total) by mouth 2 (two) times daily with a meal. 09/14/13   Delfina Redwood, MD  Cranberry 425 MG CAPS Take 1 capsule by mouth 2 (two) times daily.    Historical Provider, MD  cyclobenzaprine (FLEXERIL) 5 MG tablet Take 5 mg by mouth 3 (three) times daily as needed for muscle spasms.     Historical Provider, MD  docusate sodium (COLACE) 100 MG capsule Take 200-300 mg by mouth 2 (two) times daily. 3 caps in am, 2 caps in pm    Historical Provider, MD  epoetin alfa (EPOGEN,PROCRIT) 69450 UNIT/ML injection Inject 20,000 Units into the skin every 14 (fourteen) days.    Historical Provider, MD  ergocalciferol (VITAMIN D2) 50000 UNITS capsule Take 50,000 Units by mouth once a week. On thursday    Historical Provider, MD  famotidine (PEPCID) 20 MG tablet Take 1 tablet (20 mg total) by mouth daily. 11/17/12   Modena Jansky, MD  guaifenesin (ROBITUSSIN) 100 MG/5ML syrup Take 200 mg by mouth 4 (four) times daily as needed for cough.     Historical Provider, MD  labetalol (NORMODYNE) 100 MG tablet Take 1 tablet (100 mg total) by mouth 2 (two) times daily. 09/14/13   Delfina Redwood, MD  lamoTRIgine (LAMICTAL) 200 MG tablet Take 200 mg by mouth 2 (two) times daily.     Historical Provider, MD  levothyroxine (SYNTHROID, LEVOTHROID) 100 MCG tablet Take 1 tablet (100 mcg total) by mouth daily before  breakfast. 09/14/13   Delfina Redwood, MD  lubiprostone (AMITIZA) 24 MCG capsule Take 24 mcg by mouth 2 (two) times daily with a meal.    Historical Provider, MD  magnesium hydroxide (MILK OF MAGNESIA) 400 MG/5ML suspension Take 30 mLs by mouth daily as needed for constipation.     Historical Provider, MD  meclizine (ANTIVERT) 25 MG tablet Take 25 mg by mouth 3 (three) times daily as needed for dizziness.    Historical Provider, MD  Oxcarbazepine (TRILEPTAL) 300 MG tablet Take 900 mg by mouth 2 (two) times daily.     Historical Provider, MD  polyethylene glycol (MIRALAX / GLYCOLAX) packet Take 17 g by mouth daily.     Historical Provider, MD  risperiDONE (RISPERDAL) 2 MG tablet Take 4 mg by mouth 2 (two) times daily.     Historical Provider, MD  sevelamer carbonate (RENVELA) 800 MG tablet Take 1 tablet (800 mg total) by mouth 3 (three) times daily with meals. 11/17/12   Modena Jansky, MD  sodium phosphate (FLEET) enema Place 1 enema rectally as needed (for constipation, may self-administer.).     Historical Provider, MD   BP 101/44  Pulse 31  Temp(Src) 94.4 F (34.7 C) (Rectal)  Resp 20  SpO2 95% Physical Exam  Nursing note and vitals reviewed. Constitutional: No distress.  Chronically ill-appearing female with head hyperflexed  HENT:  Head: Normocephalic and atraumatic.  Nose: Nose normal.  Mouth/Throat: Oropharynx is clear and moist.  Neck: No JVD present. No tracheal deviation present.  Cardiovascular: Normal heart sounds and intact distal pulses.  Exam reveals no gallop and no friction rub.   No murmur heard. Bradycardia noted  Pulmonary/Chest: Effort normal and breath sounds normal. No respiratory distress. She has no wheezes. She has no rales. She exhibits no tenderness.  Abdominal: Soft. Bowel sounds are normal. She exhibits no distension and no mass. There is no tenderness. There is no rebound and no guarding.  Neurological: She is alert.  Patient is somnolent, but will  arouse easily and answer questions.  Skin: There is pallor.    ED Course  Procedures (including critical care time)  CRITICAL CARE Performed by: Kalman Drape Total critical care time: 90 min Critical care time was exclusive of separately billable procedures and treating other patients. Critical care was necessary to treat or prevent imminent or life-threatening deterioration. Critical care was time spent personally by me on the following activities: development of treatment plan with patient and/or surrogate as well as nursing, discussions with consultants, evaluation of patient's response to treatment, examination of patient,  obtaining history from patient or surrogate, ordering and performing treatments and interventions, ordering and review of laboratory studies, ordering and review of radiographic studies, pulse oximetry and re-evaluation of patient's condition. Labs Review Labs Reviewed  CBC WITH DIFFERENTIAL - Abnormal; Notable for the following:    WBC 3.0 (*)    RBC 2.11 (*)    Hemoglobin 7.1 (*)    HCT 21.2 (*)    MCV 100.5 (*)    RDW 15.6 (*)    Platelets 144 (*)    Lymphs Abs 0.5 (*)    All other components within normal limits  TSH - Abnormal; Notable for the following:    TSH 0.034 (*)    All other components within normal limits  COMPREHENSIVE METABOLIC PANEL - Abnormal; Notable for the following:    Potassium 5.5 (*)    CO2 18 (*)    BUN 88 (*)    Creatinine, Ser 5.03 (*)    Calcium 11.4 (*)    Total Bilirubin <0.2 (*)    GFR calc non Af Amer 8 (*)    GFR calc Af Amer 9 (*)    All other components within normal limits  URINALYSIS, ROUTINE W REFLEX MICROSCOPIC - Abnormal; Notable for the following:    APPearance CLOUDY (*)    Hgb urine dipstick TRACE (*)    Protein, ur 100 (*)    Leukocytes, UA LARGE (*)    All other components within normal limits  CBC - Abnormal; Notable for the following:    RBC 1.90 (*)    Hemoglobin 6.4 (*)    HCT 19.5 (*)    MCV  102.6 (*)    RDW 16.0 (*)    Platelets 125 (*)    All other components within normal limits  BASIC METABOLIC PANEL - Abnormal; Notable for the following:    Potassium 5.5 (*)    CO2 15 (*)    Glucose, Bld 100 (*)    BUN 88 (*)    Creatinine, Ser 4.73 (*)    GFR calc non Af Amer 9 (*)    GFR calc Af Amer 10 (*)    All other components within normal limits  PHOSPHORUS - Abnormal; Notable for the following:    Phosphorus 5.8 (*)    All other components within normal limits  URINE MICROSCOPIC-ADD ON - Abnormal; Notable for the following:    Bacteria, UA MANY (*)    All other components within normal limits  GLUCOSE, CAPILLARY - Abnormal; Notable for the following:    Glucose-Capillary 51 (*)    All other components within normal limits  GLUCOSE, CAPILLARY - Abnormal; Notable for the following:    Glucose-Capillary 119 (*)    All other components within normal limits  GLUCOSE, CAPILLARY - Abnormal; Notable for the following:    Glucose-Capillary 69 (*)    All other components within normal limits  I-STAT CHEM 8, ED - Abnormal; Notable for the following:    Sodium 134 (*)    Chloride 113 (*)    BUN 99 (*)    Creatinine, Ser 5.40 (*)    Calcium, Ion 1.56 (*)    Hemoglobin 7.5 (*)    HCT 22.0 (*)    All other components within normal limits  I-STAT CG4 LACTIC ACID, ED - Abnormal; Notable for the following:    Lactic Acid, Venous <0.30 (*)    All other components within normal limits  MRSA PCR SCREENING  CULTURE, BLOOD (ROUTINE X 2)  CULTURE, BLOOD (ROUTINE X 2)  URINE CULTURE  TROPONIN I  MAGNESIUM  GLUCOSE, CAPILLARY  GLUCOSE, CAPILLARY  TYPE AND SCREEN  PREPARE RBC (CROSSMATCH)    Imaging Review No results found.   EKG Interpretation   Date/Time:  Friday October 30 2013 00:24:30 EDT Ventricular Rate:  32 PR Interval:  292 QRS Duration: 114 QT Interval:  515 QTC Calculation: 376 R Axis:   17 Text Interpretation:  Sinus bradycardia Prolonged PR interval  Incomplete  left bundle branch block Anterior Q waves, possibly due to ILBBB Baseline  wander in lead(s) V2 Marked sinus bradycardia new from prior Confirmed by  OTTER  MD, OLGA ([id number]) on 10/30/2013 1:10:23 AM      MDM   Final diagnoses:  Acute on chronic renal failure  Bradycardia  Other specified hypotension  Beta blocker toxicity    67 year old female who presents with reported altered mental status.  Patient is hypothermic and bradycardic.  Patient has history of hypothyroidism, chronic kidney disease, has refused dialysis in the past.  Patient has similar presentation last month, had a bradycardia at that time thought to be secondary to labetalol use.  Patient also had acute on chronic renal failure at that time with hyperkalemia.  Patient with only slight improvement with atropine.  We'll plan for glucagon concern for labetalol overdose in light of kidney disease.  Per notes, patient was to be dropped to 200 labetalol twice a day, but nursing home records show that she is back on 300 mg twice a day.  Also concern for possible sepsis, blood cultures and serum lactate.  Expect patient will need to be admitted to the hospital.  1:45 AM Discussed with patient regarding her DNR status in light of severe bradycardia and hypotension and refusal of dialysis in the past.  Pt now states she wishes everything to be done and will do dialysis.  Pt states she wants to be able to refuse dialysis in the future, however  2:12 AM Case d/w Dr Terrence Dupont.  During our conversation, pt's HR has improved to 50s, bp still low.  He requests ccm admit and will see if needed for intervention, but feels once pt is off labetalol she should improve on her own.  Case d/w critical care who will see.  Kalman Drape, MD 10/30/13 [phone]

## 2013-10-30 NOTE — Progress Notes (Signed)
Report received from RN at [facility] for patient to be transferred into 5w07

## 2013-10-30 NOTE — Progress Notes (Signed)
NURSING PROGRESS NOTE  CODY OLIGER [id number] Transfer Data: 10/30/2013 4:49 PM Attending Provider: Doree Fudge* HWT:UUEKCM,KLKJ Mallie Mussel, MD Code Status: DNR  MICAILA ZIEMBA is a 68 y.o. female patient transferred from 6M -No acute distress noted.  -No complaints of shortness of breath.  -No complaints of chest pain.   Cardiac Monitoring: Box # 09 in place. Cardiac monitor yields:normal sinus rhythm.  Blood pressure 164/51, pulse 70, temperature 98.5 F (36.9 C), temperature source Oral, resp. rate 20, height 5\' 5"  (1.651 m), weight 66.4 kg (146 lb 6.2 oz), SpO2 95.00%.   IV Fluids:  IV in place, occlusive dsg intact without redness, IV cath forearm NS IV fluids running  Allergies:  Review of patient's allergies indicates no known allergies.  Past Medical History:   has a past medical history of Constipation; Bipolar 1 disorder; Hypertension; Esophageal reflux; Anxiety; Back pain; Osteoarthritis; Gout; Adenomatous polyps (03-27-07); Diverticulosis; Internal hemorrhoids; Hypothyroidism; Renal insufficiency; Atrial fibrillation; CKD (chronic kidney disease), stage IV; and Kyphosis.  Past Surgical History:   has past surgical history that includes Cholecystectomy; Appendectomy; Tonsillectomy; and Tubal ligation.  Social History:   reports that she quit smoking about 10 years ago. She has never used smokeless tobacco. She reports that she does not drink alcohol or use illicit drugs.  Skin: intact  Patient/Family orientated to room. Information packet given to patient/family. Admission inpatient armband information verified with patient/family to include name and date of birth and placed on patient arm. Side rails up x 2, fall assessment and education completed with patient/family. Patient/family able to verbalize understanding of risk associated with falls and verbalized understanding to call for assistance before getting out of bed. Call light within reach. Patient/family  able to voice and demonstrate understanding of unit orientation instructions.    Will continue to evaluate and treat per MD orders.

## 2013-10-30 NOTE — Progress Notes (Signed)
PCCM Interval Note  Reviewed the chart and discussed the case with Dr Moshe Cipro. Greatly appreciate her care for Ms. Latini whom she has known as an outpt. The pt's has been clear in the past that she would not want HD, would not want ACLS, but changed her mind last pm when she was scared by her acute illness. She now confirms with Dr Moshe Cipro that she would NOT want ACLS or HD. I agree with this stance. Her admission for bradycardia, hypotension and altered MS likely relates to her meds. We will need to optimize and possibly further decrease her labetalol dosing. She is stable to be transferred out of ICU 6/25.   Baltazar Apo, MD, PhD 10/30/2013, 2:14 PM [facility] [phone] or if no answer [phone]

## 2013-10-30 NOTE — Progress Notes (Signed)
eLink Physician-Brief Progress Note Patient Name: [name] DOB: 1945-12-23 MRN: [id number]  Date of Service  10/30/2013   HPI/Events of Note   Lab Results  Component Value Date   WBC 4.8 10/30/2013   HGB 6.4* 10/30/2013   HCT 19.5* 10/30/2013   MCV 102.6* 10/30/2013   PLT 125* 10/30/2013     eICU Interventions  Will transfuse 1 unit PRBC   Intervention Category Major Interventions: Other:  SOOD,VINEET 10/30/2013, 6:01 AM

## 2013-10-30 NOTE — Progress Notes (Signed)
Foley seems to be leaking, as patient was wet. She was cleaned and linens changed. Balloon was deflated, and found to have 59mL. 10 mL was reinserted into the foley balloon.  Olevia Perches, RN

## 2013-10-30 NOTE — Consult Note (Signed)
[name] Renal Consultation Note  Requesting MD: Zubelevitskiy Indication for Consultation:  Advanced CKD  HPI:  [name] is a 68 y.o. female with past medical history significant for hypertension, heart disease and hyperlipidemia. 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 She had been followed in the outpatient setting by myself at [facility]. As her GFR decreased we initiated discussions regarding dialysis.  She and her sister then educated regarding dialysis. Plans were being made for her to see the [facility] for a fistula placement.  At that time, which received notification from the sister that she and [name]  had extensive discussions regarding this. It was determined that dialysis would not be pursued due to the fact that it was so anxiety provoking for [name] and she did not think that she could "handle it"   She had continued in this point of view. She tells me that for whatever reason she got scared last night and change her decision at dialysis was something she might be interested in. However, she is since restart this up to and tells me today that she wants to refer to her previous stance of not pursuing dialysis understanding that it would make her too nervous and to anxiety ridden and that she would not be able to handle it. She understands and her life expectancy is not long without dialysis. She tells me that she just wants to "live her life". Apparently also patient was interested in full code. I talked to her about this. I told her that under the circumstances and what she has hold me it would not make sense for her to be full code as it increases the likelihood that she would have to live on "machines" and that is something she does not want.  She was admitted for bradycardia which seems to be improved. She also may have a urinary tract infection. She is very anemic and required transfusion.     Creatinine  Date/Time Value Ref Range Status   12/31/2008 12:03 PM 1.52* 0.40 - 1.20 mg/dL Final     Creatinine, Ser  Date/Time Value Ref Range Status  10/30/2013  5:25 AM 4.73* 0.50 - 1.10 mg/dL Final  10/30/2013 12:47 AM 5.40* 0.50 - 1.10 mg/dL Final  10/30/2013 12:43 AM 5.03* 0.50 - 1.10 mg/dL Final  09/14/2013  7:19 AM 4.22* 0.50 - 1.10 mg/dL Final  09/13/2013  3:41 AM 4.05* 0.50 - 1.10 mg/dL Final  09/12/2013  4:28 AM 3.99* 0.50 - 1.10 mg/dL Final  09/11/2013  5:00 AM 4.37* 0.50 - 1.10 mg/dL Final  09/10/2013  5:05 AM 4.76* 0.50 - 1.10 mg/dL Final  09/09/2013  4:44 PM 5.05* 0.50 - 1.10 mg/dL Final  08/29/2013  3:51 AM 4.27* 0.50 - 1.10 mg/dL Final  11/17/2012  4:50 AM 3.39* 0.50 - 1.10 mg/dL Final  11/16/2012  4:28 AM 3.29* 0.50 - 1.10 mg/dL Final  11/15/2012  5:23 AM 3.65* 0.50 - 1.10 mg/dL Final  11/14/2012  4:45 AM 3.62* 0.50 - 1.10 mg/dL Final  11/13/2012  5:09 AM 3.57* 0.50 - 1.10 mg/dL Final  11/12/2012  6:00 AM 3.84* 0.50 - 1.10 mg/dL Final  11/11/2012  5:35 PM 4.08* 0.50 - 1.10 mg/dL Final  11/11/2012  4:10 AM 4.34* 0.50 - 1.10 mg/dL Final  11/10/2012  8:00 PM 4.32* 0.50 - 1.10 mg/dL Final  11/10/2012 10:00 AM 4.47* 0.50 - 1.10 mg/dL Final  11/09/2012  4:08 PM 4.42* 0.50 - 1.10 mg/dL Final  11/02/2012  4:48 AM 2.84* 0.50 - 1.10 mg/dL Final  11/01/2012  8:42 AM 2.91* 0.50 - 1.10 mg/dL Final  10/31/2012  4:25 AM 2.80* 0.50 - 1.10 mg/dL Final  10/30/2012  4:36 AM 3.30* 0.50 - 1.10 mg/dL Final  10/29/2012  6:50 PM 3.50* 0.50 - 1.10 mg/dL Final  12/31/2011 11:06 AM 2.7* 0.4 - 1.2 mg/dL Final  05/17/2011 12:01 AM 2.04* 0.50 - 1.10 mg/dL Final  04/23/2011 12:42 PM 2.50* 0.50 - 1.10 mg/dL Final  04/12/2011  7:30 PM 2.21* 0.50 - 1.10 mg/dL Final  02/23/2011  4:13 AM 2.60* 0.50 - 1.10 mg/dL Final  11/09/2010  5:27 AM 1.76* 0.50 - 1.10 mg/dL Final     **Please note change in reference range.**  11/08/2010  5:40 AM 1.91* 0.50 - 1.10 mg/dL Final     **Please note change in reference range.**  11/07/2010  6:10 PM 2.16* 0.50 - 1.10 mg/dL Final     **Please note  change in reference range.**  12/02/2009  4:20 AM 1.82* 0.4 - 1.2 mg/dL Final  12/01/2009  4:00 AM 1.67* 0.4 - 1.2 mg/dL Final  11/30/2009  5:00 AM 1.92* 0.4 - 1.2 mg/dL Final  11/29/2009  3:35 AM 2.30* 0.4 - 1.2 mg/dL Final  11/28/2009  4:30 AM 2.72* 0.4 - 1.2 mg/dL Final  11/27/2009  6:00 AM 3.09* 0.4 - 1.2 mg/dL Final  11/26/2009  5:30 AM 3.34* 0.4 - 1.2 mg/dL Final  11/25/2009  9:16 AM 3.42* 0.4 - 1.2 mg/dL Final  11/24/2009  9:18 PM 4.1* 0.4 - 1.2 mg/dL Final  11/23/2009  9:14 AM 2.6* 0.4 - 1.2 mg/dL Final  01/31/2009  2:07 PM 1.44* 0.40 - 1.20 mg/dL Final  01/17/2009  2:09 PM 1.58* 0.40 - 1.20 mg/dL Final  12/23/2008  3:03 PM 1.60* 0.40 - 1.20 mg/dL Final  11/23/2008  1:21 PM 1.64* 0.40 - 1.20 mg/dL Final  07/25/2007 10:38 PM 1.44* 0.40-1.20 mg/dL Final  07/06/2007 10:47 AM 1.32*  Final  04/21/2007  2:35 AM 1.21*  Final  04/20/2007  2:07 PM 1.4*  Final     PMHx:   Past Medical History  Diagnosis Date  . Constipation   . Bipolar 1 disorder   . Hypertension   . Esophageal reflux   . Anxiety   . Back pain   . Osteoarthritis   . Gout   . Adenomatous polyps 03-27-07  . Diverticulosis   . Internal hemorrhoids   . Hypothyroidism   . Renal insufficiency   . Atrial fibrillation   . CKD (chronic kidney disease), stage IV   . Kyphosis     Past Surgical History  Procedure Laterality Date  . Cholecystectomy    . Appendectomy    . Tonsillectomy    . Tubal ligation      Family Hx:  Family History  Problem Relation Age of Onset  . Hypertension Mother   . Heart disease Mother     before age 68  . Hyperlipidemia Mother   . Atrial fibrillation Father   . Colon cancer Neg Hx     Social History:  reports that she quit smoking about 10 years ago. She has never used smokeless tobacco. She reports that she does not drink alcohol or use illicit drugs.  Allergies: No Known Allergies  Medications: Prior to Admission medications   Medication Sig Start Date End Date Taking? Authorizing  [name]  acetaminophen (TYLENOL) 500 MG tablet Take 500 mg by mouth every 4 (four) hours as needed for moderate pain.  Yes Historical Anna Arango, MD  ALPRAZolam [name]) 1 MG tablet Take 1 mg by mouth 4 (four) times daily.   Yes Historical Denaisha Swango, MD  ALPRAZolam [name]) 1 MG tablet Take 1 mg by mouth at bedtime as needed for anxiety or sleep.   Yes Historical Madalynne Gutmann, MD  amLODipine (NORVASC) 10 MG tablet Take 10 mg by mouth daily.   Yes Historical Yuleimy Kretz, MD  calcitRIOL (ROCALTROL) 0.25 MCG capsule Take 0.5 mcg by mouth daily.   Yes Historical Lorilynn Lehr, MD  Cranberry 425 MG CAPS Take 1 capsule by mouth 2 (two) times daily.   Yes Historical Okla Qazi, MD  docusate sodium (COLACE) 100 MG capsule Take 200-300 mg by mouth 2 (two) times daily. 3 caps in am, 2 caps in pm   Yes Historical Leota Maka, MD  ergocalciferol (VITAMIN D2) 50000 UNITS capsule Take 50,000 Units by mouth once a week. On thursday   Yes Historical Justis Closser, MD  famotidine (PEPCID) 20 MG tablet Take 1 tablet (20 mg total) by mouth daily. 11/17/12  Yes Modena Jansky, MD  guaifenesin (ROBITUSSIN) 100 MG/5ML syrup Take 200 mg by mouth 4 (four) times daily as needed for cough.    Yes Historical Aphrodite Harpenau, MD  labetalol (NORMODYNE) 100 MG tablet Take 1 tablet (100 mg total) by mouth 2 (two) times daily. 09/14/13  Yes Delfina Redwood, MD  lamoTRIgine (LAMICTAL) 200 MG tablet Take 200 mg by mouth 2 (two) times daily.    Yes Historical Aariana Shankland, MD  levothyroxine (SYNTHROID, LEVOTHROID) 100 MCG tablet Take 1 tablet (100 mcg total) by mouth daily before breakfast. 09/14/13  Yes Delfina Redwood, MD  Linaclotide (LINZESS) 290 MCG CAPS capsule Take 290 mcg by mouth daily.   Yes Historical Jeris Easterly, MD  magnesium hydroxide (MILK OF MAGNESIA) 400 MG/5ML suspension Take 30 mLs by mouth daily as needed for constipation.    Yes Historical Skylier Kretschmer, MD  meclizine (ANTIVERT) 25 MG tablet Take 25 mg by mouth 3 (three) times daily as needed for  dizziness.   Yes Historical Linetta Regner, MD  Oxcarbazepine (TRILEPTAL) 300 MG tablet Take 900 mg by mouth 2 (two) times daily.    Yes Historical Carlye Panameno, MD  polyethylene glycol (MIRALAX / GLYCOLAX) packet Take 17 g by mouth daily.    Yes Historical Lamondre Wesche, MD  risperiDONE (RISPERDAL) 2 MG tablet Take 4 mg by mouth 2 (two) times daily.    Yes Historical Carnisha Feltz, MD  sevelamer carbonate (RENVELA) 800 MG tablet Take 1 tablet (800 mg total) by mouth 3 (three) times daily with meals. 11/17/12  Yes Modena Jansky, MD  sodium phosphate (FLEET) enema Place 1 enema rectally as needed (for constipation, may self-administer.).    Yes Historical Porche Steinberger, MD    I have reviewed the patient's current medications.  Labs:  Results for orders placed during the hospital encounter of 10/30/13 (from the past 48 hour(s))  CBC WITH DIFFERENTIAL     Status: Abnormal   Collection Time    10/30/13 12:43 AM      Result Value Ref Range   WBC 3.0 (*) 4.0 - 10.5 K/uL   RBC 2.11 (*) 3.87 - 5.11 MIL/uL   Hemoglobin 7.1 (*) 12.0 - 15.0 g/dL   HCT 21.2 (*) 36.0 - 46.0 %   MCV 100.5 (*) 78.0 - 100.0 fL   MCH 33.6  26.0 - 34.0 pg   MCHC 33.5  30.0 - 36.0 g/dL   RDW 15.6 (*) 11.5 - 15.5 %   Platelets 144 (*) 150 -  400 K/uL   Neutrophils Relative % 74  43 - 77 %   Neutro Abs 2.2  1.7 - 7.7 K/uL   Lymphocytes Relative 16  12 - 46 %   Lymphs Abs 0.5 (*) 0.7 - 4.0 K/uL   Monocytes Relative 7  3 - 12 %   Monocytes Absolute 0.2  0.1 - 1.0 K/uL   Eosinophils Relative 2  0 - 5 %   Eosinophils Absolute 0.1  0.0 - 0.7 K/uL   Basophils Relative 1  0 - 1 %   Basophils Absolute 0.0  0.0 - 0.1 K/uL  TROPONIN I     Status: None   Collection Time    10/30/13 12:43 AM      Result Value Ref Range   Troponin I <0.30  <0.30 ng/mL   Comment:            Due to the release kinetics of cTnI,     a negative result within the first hours     of the onset of symptoms does not rule out     myocardial infarction with certainty.      If myocardial infarction is still suspected,     repeat the test at appropriate intervals.  TSH     Status: Abnormal   Collection Time    10/30/13 12:43 AM      Result Value Ref Range   TSH 0.034 (*) 0.350 - 4.500 uIU/mL  COMPREHENSIVE METABOLIC PANEL     Status: Abnormal   Collection Time    10/30/13 12:43 AM      Result Value Ref Range   Sodium 137  137 - 147 mEq/L   Potassium 5.5 (*) 3.7 - 5.3 mEq/L   Chloride 102  96 - 112 mEq/L   CO2 18 (*) 19 - 32 mEq/L   Glucose, Bld 90  70 - 99 mg/dL   BUN 88 (*) 6 - 23 mg/dL   Creatinine, Ser 5.03 (*) 0.50 - 1.10 mg/dL   Calcium 11.4 (*) 8.4 - 10.5 mg/dL   Total Protein 6.7  6.0 - 8.3 g/dL   Albumin 3.7  3.5 - 5.2 g/dL   AST 10  0 - 37 U/L   ALT 12  0 - 35 U/L   Alkaline Phosphatase 90  39 - 117 U/L   Total Bilirubin <0.2 (*) 0.3 - 1.2 mg/dL   GFR calc non Af Amer 8 (*) >90 mL/min   GFR calc Af Amer 9 (*) >90 mL/min   Comment: (NOTE)     The eGFR has been calculated using the CKD EPI equation.     This calculation has not been validated in all clinical situations.     eGFR's persistently <90 mL/min signify possible Chronic Kidney     Disease.  I-STAT CHEM 8, ED     Status: Abnormal   Collection Time    10/30/13 12:47 AM      Result Value Ref Range   Sodium 134 (*) 137 - 147 mEq/L   Potassium 5.2  3.7 - 5.3 mEq/L   Chloride 113 (*) 96 - 112 mEq/L   BUN 99 (*) 6 - 23 mg/dL   Creatinine, Ser 5.40 (*) 0.50 - 1.10 mg/dL   Glucose, Bld 84  70 - 99 mg/dL   Calcium, Ion 1.56 (*) 1.13 - 1.30 mmol/L   TCO2 19  0 - 100 mmol/L   Hemoglobin 7.5 (*) 12.0 - 15.0 g/dL   HCT 22.0 (*) 36.0 -  46.0 %  I-STAT CG4 LACTIC ACID, ED     Status: Abnormal   Collection Time    10/30/13 12:48 AM      Result Value Ref Range   Lactic Acid, Venous <0.30 (*) 0.5 - 2.2 mmol/L  URINALYSIS, ROUTINE W REFLEX MICROSCOPIC     Status: Abnormal   Collection Time    10/30/13  2:50 AM      Result Value Ref Range   Color, Urine YELLOW  YELLOW   APPearance CLOUDY  (*) CLEAR   Specific Gravity, Urine 1.013  1.005 - 1.030   pH 6.0  5.0 - 8.0   Glucose, UA NEGATIVE  NEGATIVE mg/dL   Hgb urine dipstick TRACE (*) NEGATIVE   Bilirubin Urine NEGATIVE  NEGATIVE   Ketones, ur NEGATIVE  NEGATIVE mg/dL   Protein, ur 100 (*) NEGATIVE mg/dL   Urobilinogen, UA 0.2  0.0 - 1.0 mg/dL   Nitrite NEGATIVE  NEGATIVE   Leukocytes, UA LARGE (*) NEGATIVE  URINE MICROSCOPIC-ADD ON     Status: Abnormal   Collection Time    10/30/13  2:50 AM      Result Value Ref Range   Squamous Epithelial / LPF RARE  RARE   WBC, UA 11-20  <3 WBC/hpf   RBC / HPF 0-2  <3 RBC/hpf   Bacteria, UA MANY (*) RARE  GLUCOSE, CAPILLARY     Status: Abnormal   Collection Time    10/30/13  4:33 AM      Result Value Ref Range   Glucose-Capillary 51 (*) 70 - 99 mg/dL  MRSA PCR SCREENING     Status: None   Collection Time    10/30/13  4:39 AM      Result Value Ref Range   MRSA by PCR NEGATIVE  NEGATIVE   Comment:            The GeneXpert MRSA Assay (FDA     approved for NASAL specimens     only), is one component of a     comprehensive MRSA colonization     surveillance program. It is not     intended to diagnose MRSA     infection nor to guide or     monitor treatment for     MRSA infections.  GLUCOSE, CAPILLARY     Status: Abnormal   Collection Time    10/30/13  5:09 AM      Result Value Ref Range   Glucose-Capillary 119 (*) 70 - 99 mg/dL  CBC     Status: Abnormal   Collection Time    10/30/13  5:25 AM      Result Value Ref Range   WBC 4.8  4.0 - 10.5 K/uL   RBC 1.90 (*) 3.87 - 5.11 MIL/uL   Hemoglobin 6.4 (*) 12.0 - 15.0 g/dL   Comment: DELTA CHECK NOTED     REPEATED TO VERIFY     CRITICAL RESULT CALLED TO, READ BACK BY AND VERIFIED WITH:     ROYAL C,RN 10/30/13 0558 WAYK   HCT 19.5 (*) 36.0 - 46.0 %   MCV 102.6 (*) 78.0 - 100.0 fL   MCH 33.7  26.0 - 34.0 pg   MCHC 32.8  30.0 - 36.0 g/dL   RDW 16.0 (*) 11.5 - 15.5 %   Platelets 125 (*) 150 - 400 K/uL  BASIC METABOLIC  PANEL     Status: Abnormal   Collection Time    10/30/13  5:25 AM  Result Value Ref Range   Sodium 138  137 - 147 mEq/L   Potassium 5.5 (*) 3.7 - 5.3 mEq/L   Chloride 105  96 - 112 mEq/L   CO2 15 (*) 19 - 32 mEq/L   Glucose, Bld 100 (*) 70 - 99 mg/dL   BUN 88 (*) 6 - 23 mg/dL   Creatinine, Ser 4.73 (*) 0.50 - 1.10 mg/dL   Calcium 10.5  8.4 - 10.5 mg/dL   GFR calc non Af Amer 9 (*) >90 mL/min   GFR calc Af Amer 10 (*) >90 mL/min   Comment: (NOTE)     The eGFR has been calculated using the CKD EPI equation.     This calculation has not been validated in all clinical situations.     eGFR's persistently <90 mL/min signify possible Chronic Kidney     Disease.  PHOSPHORUS     Status: Abnormal   Collection Time    10/30/13  5:25 AM      Result Value Ref Range   Phosphorus 5.8 (*) 2.3 - 4.6 mg/dL  MAGNESIUM     Status: None   Collection Time    10/30/13  5:25 AM      Result Value Ref Range   Magnesium 2.5  1.5 - 2.5 mg/dL  PREPARE RBC (CROSSMATCH)     Status: None   Collection Time    10/30/13  6:30 AM      Result Value Ref Range   Order Confirmation ORDER PROCESSED BY BLOOD BANK    TYPE AND SCREEN     Status: None   Collection Time    10/30/13  7:00 AM      Result Value Ref Range   ABO/RH(D) O POS     Antibody Screen NEG     Sample Expiration 11/02/2013     Unit Number [id number]     Blood Component Type RBC LR PHER1     Unit division 00     Status of Unit ISSUED     Transfusion Status OK TO TRANSFUSE     Crossmatch Result Compatible    GLUCOSE, CAPILLARY     Status: None   Collection Time    10/30/13  8:07 AM      Result Value Ref Range   Glucose-Capillary 73  70 - 99 mg/dL     ROS:  A comprehensive review of systems was negative except for: Gastrointestinal: positive for nausea Behavioral/Psych: positive for anxiety and school phobia  Physical Exam: Filed Vitals:   10/30/13 1130  BP:   Pulse: 70  Temp: 97.1 F (36.2 C)  Resp: 21     General: A  very nervous appearing white female. She has scoliosis kyphotic deformity.  Eyes: pupils are equal round reactive to light symmetrical motions are intact, mucous membranes are moist  Neck: There is no jugular venous distention Heart: Regular rate and rhythm. Heart rate is improved since admission  Lungs: Mostly clear Abdomen: soft, nontender, nondistended  Extremities: no significant peripheral edema  Skin: warm and dry Neuro: talkative, nervous   Assessment/Plan: 68 year old white female with bipolar disorder and anxiety. She also has advanced CKD. She is currently in the hospital secondary to bradycardia due to beta blocker. 1.Renal- advanced CKD at baseline. Discussions have been held as an outpatient regarding dialysis therapy. Patient previously had come to what I feel is an appropriate decision for her deciding not per to dialysis because it was simply to anxiety provoking. She said she changed her  mind briefly last night and he moment but is now back to her previous stance of not wanting to pursue dialysis. I believe this is an informed decision and an appropriate decision for her.  2. Hypertension/volume  -  is reasonable at this time. Bradycardia is improved off of beta blocker. Perhaps an alternative agent should be considered.  3. Anemia  -  is severe and likely related to her CKD. she status post a transfusion today. She may be a candidate for ESA therapy in as an outpatient. However, patient has not come back to see me in the office after decision above have been made.  4. bacteruria-    is a chronic issue for her.  I agree with no antibiotics 5. metabolic acidosis and hyperkalemia- I will start oral sodium bicarbonate 6. Bones- continue home calcitriol and Renvela  7. Pt tells me that she desires a no code status- I will change  Thank you for this consultation. Decision made regarding dialysis as above. I will sign off and make an appointment for her to see me in the outpatient  setting. Call with any further questions    GOLDSBOROUGH,KELLIE A 10/30/2013, 11:38 AM

## 2013-10-30 NOTE — H&P (Signed)
PULMONARY / CRITICAL CARE MEDICINE   Name: [name] MRN: [id number] DOB: 1946-02-14    ADMISSION DATE:  10/30/2013 CONSULTATION DATE:  10/30/2013  REFERRING MD :  EDP PRIMARY SERVICE: PCCM  CHIEF COMPLAINT:  AMS  BRIEF PATIENT DESCRIPTION: 68 year old female with CKD ( refused HD in the past ), HTN, and psych history presented from [facility] 6/26 for AMS. She was bradycardic and hypotensive thought to be related to scheduled beta-blocker use. PCCM asked to consult for admission.  SIGNIFICANT EVENTS / STUDIES:  5/6-5/03/2014 > admission to [facility] for AMS thought secondary to UTI, was bradycardic on admit, labetolol was decreased.   LINES / TUBES:  CULTURES:  ANTIBIOTICS: Blood 6/26 >>> Urine 6/26 >>>  HISTORY OF PRESENT ILLNESS:  68 year old female with history as below, which includes CKD (has refused dialysis in past), HTN, A-fib, Bipolar, and anxiety. Recent admission for AMS in May. This was thought to be secondary to UTI. She was treated with ABX and discharged to [facility] 5/11 as DNR status. Of note, during that admission she was found to be bradycardic while on labetolol 300mg  BID. This dose was reduced to 200mg  BID. 6/26 she presented to [facility] ED again for AMS. In ED she was noted to be mildly lethargic, hypotensive, and bradycardic (30's). Her CKD is also worse than prior admission. PCCM asked to see for admission.   PAST MEDICAL HISTORY :  Past Medical History  Diagnosis Date  . Constipation   . Bipolar 1 disorder   . Hypertension   . Esophageal reflux   . Anxiety   . Back pain   . Osteoarthritis   . Gout   . Adenomatous polyps 03-27-07  . Diverticulosis   . Internal hemorrhoids   . Hypothyroidism   . Renal insufficiency   . Atrial fibrillation   . CKD (chronic kidney disease), stage IV   . Kyphosis    Past Surgical History  Procedure Laterality Date  . Cholecystectomy    . Appendectomy    . Tonsillectomy    . Tubal ligation     Prior to Admission medications    Medication Sig Start Date End Date Taking? Authorizing Provider  acetaminophen (TYLENOL) 500 MG tablet Take 500 mg by mouth every 4 (four) hours as needed for moderate pain.     Historical Provider, MD  ALPRAZolam Duanne Moron) 1 MG tablet Take 1 mg by mouth 4 (four) times daily.    Historical Provider, MD  ALPRAZolam Duanne Moron) 1 MG tablet Take 1 mg by mouth at bedtime as needed for anxiety or sleep.    Historical Provider, MD  calcitRIOL (ROCALTROL) 0.25 MCG capsule Take 0.5 mcg by mouth daily.    Historical Provider, MD  cefUROXime (CEFTIN) 500 MG tablet Take 1 tablet (500 mg total) by mouth 2 (two) times daily with a meal. 09/14/13   Delfina Redwood, MD  Cranberry 425 MG CAPS Take 1 capsule by mouth 2 (two) times daily.    Historical Provider, MD  cyclobenzaprine (FLEXERIL) 5 MG tablet Take 5 mg by mouth 3 (three) times daily as needed for muscle spasms.     Historical Provider, MD  docusate sodium (COLACE) 100 MG capsule Take 200-300 mg by mouth 2 (two) times daily. 3 caps in am, 2 caps in pm    Historical Provider, MD  epoetin alfa (EPOGEN,PROCRIT) 06301 UNIT/ML injection Inject 20,000 Units into the skin every 14 (fourteen) days.    Historical Provider, MD  ergocalciferol (VITAMIN D2) 50000  UNITS capsule Take 50,000 Units by mouth once a week. On thursday    Historical Provider, MD  famotidine (PEPCID) 20 MG tablet Take 1 tablet (20 mg total) by mouth daily. 11/17/12   Modena Jansky, MD  guaifenesin (ROBITUSSIN) 100 MG/5ML syrup Take 200 mg by mouth 4 (four) times daily as needed for cough.     Historical Provider, MD  labetalol (NORMODYNE) 100 MG tablet Take 1 tablet (100 mg total) by mouth 2 (two) times daily. 09/14/13   Delfina Redwood, MD  lamoTRIgine (LAMICTAL) 200 MG tablet Take 200 mg by mouth 2 (two) times daily.     Historical Provider, MD  levothyroxine (SYNTHROID, LEVOTHROID) 100 MCG tablet Take 1 tablet (100 mcg total) by mouth daily before breakfast. 09/14/13   Delfina Redwood, MD   lubiprostone (AMITIZA) 24 MCG capsule Take 24 mcg by mouth 2 (two) times daily with a meal.    Historical Provider, MD  magnesium hydroxide (MILK OF MAGNESIA) 400 MG/5ML suspension Take 30 mLs by mouth daily as needed for constipation.     Historical Provider, MD  meclizine (ANTIVERT) 25 MG tablet Take 25 mg by mouth 3 (three) times daily as needed for dizziness.    Historical Provider, MD  Oxcarbazepine (TRILEPTAL) 300 MG tablet Take 900 mg by mouth 2 (two) times daily.     Historical Provider, MD  polyethylene glycol (MIRALAX / GLYCOLAX) packet Take 17 g by mouth daily.     Historical Provider, MD  risperiDONE (RISPERDAL) 2 MG tablet Take 4 mg by mouth 2 (two) times daily.     Historical Provider, MD  sevelamer carbonate (RENVELA) 800 MG tablet Take 1 tablet (800 mg total) by mouth 3 (three) times daily with meals. 11/17/12   Modena Jansky, MD  sodium phosphate (FLEET) enema Place 1 enema rectally as needed (for constipation, may self-administer.).     Historical Provider, MD   No Known Allergies  FAMILY HISTORY:  Family History  Problem Relation Age of Onset  . Hypertension Mother   . Heart disease Mother     before age 68  . Hyperlipidemia Mother   . Atrial fibrillation Father   . Colon cancer Neg Hx    SOCIAL HISTORY:  reports that she quit smoking about 10 years ago. She has never used smokeless tobacco. She reports that she does not drink alcohol or use illicit drugs.  REVIEW OF SYSTEMS:   Bolds are positive  Constitutional: weight loss, gain, night sweats, Fevers, chills, fatigue .  HEENT: headaches, Sore throat, sneezing, nasal congestion, post nasal drip, Difficulty swallowing, Tooth/dental problems, visual complaints visual changes, ear ache CV:  chest pain, radiates: ,Orthopnea, PND, swelling in lower extremities, dizziness, palpitations, syncope.  GI  heartburn, indigestion, abdominal pain, nausea, vomiting, diarrhea, change in bowel habits, loss of appetite, bloody  stools.  Resp: cough, productive: , hemoptysis, dyspnea, chest pain, pleuritic.  Skin: rash or itching or icterus GU: dysuria, change in color of urine, urgency or frequency. flank pain, hematuria  MS: joint pain or swelling. decreased range of motion  Psych: change in mood or affect. depression or anxiety.  Neuro: difficulty with speech, weakness, numbness, ataxia   INTERVAL HISTORY:   VITAL SIGNS: Temp:  [94.4 F (34.7 C)] 94.4 F (34.7 C) (06/26 0045) Pulse Rate:  [31-46] 39 (06/26 0200) Resp:  [6-25] 14 (06/26 0200) BP: (81-162)/(36-59) 88/36 mmHg (06/26 0200) SpO2:  [94 %-100 %] 96 % (06/26 0200) HEMODYNAMICS:   VENTILATOR SETTINGS:  INTAKE / OUTPUT: Intake/Output   None     PHYSICAL EXAMINATION: General:  Chronically ill appearing female in NAD Neuro:  Spontaneously awake, alert. Mild confusion. Unclear how far this is from baseline.  HEENT:  [place]/AT, No JVD noted Cardiovascular:  Loletha Grayer, regular. No MRG Lungs:  Even, unlabored, Clear anteriorly Abdomen:  Soft, non-tender.  Musculoskeletal: Kyphosis, no acute deformity  Skin:  Several ecchymotic areas to extremities.   LABS:  CBC  Recent Labs Lab 10/30/13 0043 10/30/13 0047  WBC 3.0*  --   HGB 7.1* 7.5*  HCT 21.2* 22.0*  PLT 144*  --    Coag's No results found for this basename: APTT, INR,  in the last 168 hours BMET  Recent Labs Lab 10/30/13 0043 10/30/13 0047  NA 137 134*  K 5.5* 5.2  CL 102 113*  CO2 18*  --   BUN 88* 99*  CREATININE 5.03* 5.40*  GLUCOSE 90 84   Electrolytes  Recent Labs Lab 10/30/13 0043  CALCIUM 11.4*   Sepsis Markers  Recent Labs Lab 10/30/13 0048  LATICACIDVEN <0.30*   ABG No results found for this basename: PHART, PCO2ART, PO2ART,  in the last 168 hours Liver Enzymes  Recent Labs Lab 10/30/13 0043  AST 10  ALT 12  ALKPHOS 90  BILITOT <0.2*  ALBUMIN 3.7   Cardiac Enzymes  Recent Labs Lab 10/30/13 0043  TROPONINI <0.30   Glucose No results  found for this basename: GLUCAP,  in the last 168 hours  IMAGING:   Dg Chest Port 1 View  10/30/2013   CLINICAL DATA:  Bradycardia, hypothermia.  EXAM: PORTABLE CHEST - 1 VIEW  COMPARISON:  Prior radiograph from 09/09/2013  FINDINGS: Patient is rotated to the right. Defibrillator pad overlies left hemi thorax. Cardiomegaly is stable. Mediastinal silhouette grossly within normal limits.  Lungs are mildly hypoinflated. No focal infiltrate, pulmonary edema, or pleural effusion. No pneumothorax.  No acute osseous abnormality.  IMPRESSION: Shallow lung inflation with no acute cardiopulmonary abnormality identified.   Electronically Signed   By: Jeannine Boga M.D.   On: 10/30/2013 01:22    ASSESSMENT / PLAN:  PULMONARY A: No active issues P:   No intervention required  CARDIOVASCULAR A:  Sinus bradycardia -  Currently high 50's, when rests drops to 30's. It appears as though her labetolol dose was not changed at SNF after recent admission. Although unclear, she may have still been receiving 300 mg BID per transfer documentation.   Hypotension H/o HTN, AF P:  Hold labetolol - expect brady  /hypotension to resolve spontaneously Atropine at bedside   RENAL A:   CKD - baseline creat 4.3 Hyperkalemia P:   Gentle IVF hydration  Has previously refused dialysis, now unsure Consult renal 6/27 AM Continue home calcitriol, sevelamer  GASTROINTESTINAL A:   Hx GERD P:   Continue home pepcid Hold home lubiprostone  HEMATOLOGIC A:   Anemia of chronic illness secondary to CKD P:  Follow CBC Transfuse per normal ICU protocol  INFECTIOUS A:   No overt infection P:   Monitor off ABX for now Follow UA results, Cultures Trend WBC and fever curve  ENDOCRINE A:   Hypothyroid P:   Check TSH Continue synthroid  NEUROLOGIC A:   Bipolar disorder Anxiety  P:   Holding sedating medications for now, restart when able ( flexaril, lamictal, oxcarbazepime, risperidone) Continue  Xanax to avoid withdrawal  GLOBAL Long conversation with Mrs Kreischer, who would like to change her code status to Full Code  at this time. In the past she stated that she would not want dialysis, but she is re-thinking this decision as well. If tomorrow 6/27 she is still interested, consult renal. Could possibly benefit from palliative care consult at some point during admission.   Georgann Housekeeper, ACNP [place] Pulmonology/Critical Care Pager [phone] or [phone]  I have personally obtained history, examined patient, evaluated and interpreted laboratory and imaging results, reviewed medical records, formulated assessment / plan and placed orders.  CRITICAL CARE:  The patient is critically ill with multiple organ systems failure and requires high complexity decision making for assessment and support, frequent evaluation and titration of therapies, application of advanced monitoring technologies and extensive interpretation of multiple databases. Critical Care Time devoted to patient care services described in this note is 35 minutes.   Doree Fudge, MD Pulmonary and [place] Pager: [phone]  10/30/2013, 7:27 AM

## 2013-10-30 NOTE — Progress Notes (Signed)
CRITICAL VALUE ALERT  Critical value received: hgb 6.4  Date of notification:  10-30-13  Time of notification:  0600  Critical value read back:Yes.    Nurse who received alert:  Barrie Lyme RN  MD notified (1st page):  Dr Halford Chessman  Time of first page:  0601  Responding MD: Dr Halford Chessman  Time MD responded:  [phone]

## 2013-10-30 NOTE — ED Notes (Signed)
Per EMS, pt from [facility]. Ambulance was called because "she was not acting like herself". EMS states pt was lethargic but alert and oriented. Pulse in 30-45 range but vitals otherwise stable.

## 2013-10-30 NOTE — ED Notes (Signed)
i-stat CG4 result given to Dr. Sharol Given

## 2013-10-31 DIAGNOSIS — N184 Chronic kidney disease, stage 4 (severe): Secondary | ICD-10-CM

## 2013-10-31 DIAGNOSIS — I10 Essential (primary) hypertension: Secondary | ICD-10-CM

## 2013-10-31 DIAGNOSIS — F314 Bipolar disorder, current episode depressed, severe, without psychotic features: Secondary | ICD-10-CM

## 2013-10-31 DIAGNOSIS — R4182 Altered mental status, unspecified: Secondary | ICD-10-CM

## 2013-10-31 DIAGNOSIS — I498 Other specified cardiac arrhythmias: Principal | ICD-10-CM

## 2013-10-31 LAB — TYPE AND SCREEN
ABO/RH(D): O POS
Antibody Screen: NEGATIVE
UNIT DIVISION: 0

## 2013-10-31 LAB — URINE CULTURE: Colony Count: >=100000

## 2013-10-31 MED ORDER — ACETAMINOPHEN 325 MG PO TABS
650.0000 mg | ORAL_TABLET | Freq: Four times a day (QID) | ORAL | Status: DC | PRN
  Administered 2013-10-31 – 2013-11-04 (×9): 650 mg via ORAL
  Filled 2013-10-31 (×8): qty 2

## 2013-10-31 MED ORDER — LABETALOL HCL 100 MG PO TABS
100.0000 mg | ORAL_TABLET | Freq: Two times a day (BID) | ORAL | Status: DC
  Administered 2013-10-31 – 2013-11-04 (×9): 100 mg via ORAL
  Filled 2013-10-31 (×10): qty 1

## 2013-10-31 MED ORDER — HYDRALAZINE HCL 10 MG PO TABS
10.0000 mg | ORAL_TABLET | Freq: Four times a day (QID) | ORAL | Status: DC | PRN
  Administered 2013-10-31: 10 mg via ORAL
  Filled 2013-10-31: qty 1

## 2013-10-31 NOTE — Progress Notes (Signed)
Patient asked for something for a HA- Doctor Z has been notified. He is putting something in now. Awaiting new order.

## 2013-10-31 NOTE — Progress Notes (Signed)
Patient has continuous fluids- RN will attempt to try IV if patient will approve.

## 2013-10-31 NOTE — Progress Notes (Signed)
PULMONARY / CRITICAL CARE MEDICINE   Name: [name] MRN: [id number] DOB: 01/15/1946    ADMISSION DATE:  10/30/2013 CONSULTATION DATE:  10/30/2013  REFERRING MD :  EDP PRIMARY SERVICE: PCCM  CHIEF COMPLAINT:  AMS  BRIEF PATIENT DESCRIPTION: 68 year old female with CKD ( refused HD in the past ), HTN, and psych history presented from [facility] 6/26 for AMS. She was bradycardic and hypotensive thought to be related to scheduled beta-blocker use. PCCM asked to consult for admission.  SIGNIFICANT EVENTS / STUDIES:  5/6-5/03/2014 > admission to [facility] for AMS thought secondary to UTI, was bradycardic on admit, labetolol was decreased.   LINES / TUBES:  CULTURES:  ANTIBIOTICS: Blood 6/26 >>> Urine 6/26 >>>   INTERVAL HISTORY: As of Dr Agustina Caroli note 6/26- no HD, no ACLS. She is asking foley be removed. Denies acute issues.  VITAL SIGNS: Temp:  [96.5 F (35.8 C)-99.3 F (37.4 C)] 98.4 F (36.9 C) (06/27 0404) Pulse Rate:  [58-70] 69 (06/27 0404) Resp:  [13-23] 14 (06/27 0404) BP: (136-195)/(49-73) 169/73 mmHg (06/27 0523) SpO2:  [94 %-100 %] 96 % (06/27 0404) Weight:  [65.4 kg (144 lb 2.9 oz)] 65.4 kg (144 lb 2.9 oz) (06/27 0404) HEMODYNAMICS:   VENTILATOR SETTINGS:   INTAKE / OUTPUT: Intake/Output     06/26 0701 - 06/27 0700 06/27 0701 - 06/28 0700   P.O. 444    I.V. (mL/kg) 684.2 (10.5)    Blood 335    Total Intake(mL/kg) 1463.2 (22.4)    Urine (mL/kg/hr) 3175 (2) 500 (3.3)   Total Output 3175 500   Net -1711.8 -500          PHYSICAL EXAMINATION: General:  Chronically ill appearing female in NAD, feeding herself Neuro:  Spontaneously awake, alert. Continues mild confusion. Unclear how far this is from baseline.  HEENT:  [place]/AT, No JVD noted Cardiovascular:  Loletha Grayer, regular. 8-1/4 systolic M LUSB, no edema Lungs:  Even, unlabored, Clear anteriorly Abdomen:  Soft, non-tender. Clear urine in foley bag Musculoskeletal: Kyphosis, no acute deformity  Skin:  Several  ecchymotic areas to extremities.   LABS:  CBC  Recent Labs Lab 10/30/13 0043 10/30/13 0047 10/30/13 0525  WBC 3.0*  --  4.8  HGB 7.1* 7.5* 6.4*  HCT 21.2* 22.0* 19.5*  PLT 144*  --  125*   Coag's No results found for this basename: APTT, INR,  in the last 168 hours BMET  Recent Labs Lab 10/30/13 0043 10/30/13 0047 10/30/13 0525  NA 137 134* 138  K 5.5* 5.2 5.5*  CL 102 113* 105  CO2 18*  --  15*  BUN 88* 99* 88*  CREATININE 5.03* 5.40* 4.73*  GLUCOSE 90 84 100*   Electrolytes  Recent Labs Lab 10/30/13 0043 10/30/13 0525  CALCIUM 11.4* 10.5  MG  --  2.5  PHOS  --  5.8*   Sepsis Markers  Recent Labs Lab 10/30/13 0048  LATICACIDVEN <0.30*   ABG No results found for this basename: PHART, PCO2ART, PO2ART,  in the last 168 hours Liver Enzymes  Recent Labs Lab 10/30/13 0043  AST 10  ALT 12  ALKPHOS 90  BILITOT <0.2*  ALBUMIN 3.7   Cardiac Enzymes  Recent Labs Lab 10/30/13 0043  TROPONINI <0.30   Glucose  Recent Labs Lab 10/30/13 0433 10/30/13 0509 10/30/13 0807 10/30/13 1241 10/30/13 1403  GLUCAP 51* 119* 73 69* 82    IMAGING:   Dg Chest Port 1 View  10/30/2013   CLINICAL DATA:  Bradycardia, hypothermia.  EXAM: PORTABLE CHEST - 1 VIEW  COMPARISON:  Prior radiograph from 09/09/2013  FINDINGS: Patient is rotated to the right. Defibrillator pad overlies left hemi thorax. Cardiomegaly is stable. Mediastinal silhouette grossly within normal limits.  Lungs are mildly hypoinflated. No focal infiltrate, pulmonary edema, or pleural effusion. No pneumothorax.  No acute osseous abnormality.  IMPRESSION: Shallow lung inflation with no acute cardiopulmonary abnormality identified.   Electronically Signed   By: Jeannine Boga M.D.   On: 10/30/2013 01:22    ASSESSMENT / PLAN:  PULMONARY A: No active issues P:   No intervention required  CARDIOVASCULAR A:  Sinus bradycardia -  Currently 69 off labetalol Hypotension-resolved H/o HTN,  AF P:  Hold labetolol - expect brady  /hypotension to resolve spontaneously Atropine at bedside Watch BP trend- labile   RENAL A:   CKD - baseline creat 4.3 Hyperkalemia P:   Gentle IVF hydration  Has previously refused dialysis- confirmed Consulted renal 6/27 AM Continue home calcitriol, sevelamer  GASTROINTESTINAL A:   Hx GERD P:   Continue home pepcid Hold home lubiprostone  HEMATOLOGIC A:   Anemia of chronic illness secondary to CKD P:  Follow CBC Transfuse per normal ICU protocol  INFECTIOUS A:   No overt infection P:   Monitor off ABX for now Follow UA results, Cultures Trend WBC and fever curve  ENDOCRINE A:   Hypothyroid P:   Check TSH Continue synthroid  NEUROLOGIC A:   Bipolar disorder Anxiety  P:   Holding sedating medications for now, restart when able ( flexaril, lamictal, oxcarbazepime, risperidone) Continue Xanax to avoid withdrawal    Deneise Lever, MD Pulmonary and [place] Pager: [phone]  10/31/2013, 9:18 AM

## 2013-10-31 NOTE — Progress Notes (Signed)
RN noticed that patient's IV was leaking blood and occluded- IV has been d/c'ed. Patient stated that since she does not have anything intervenously tonight, can she have IV replaced tomr. MD Z has been notified to place a care order containing this request. Awaiting call return.

## 2013-10-31 NOTE — Progress Notes (Signed)
Dr. Annamaria Boots, [facility] paged and made aware of Pt BP progressively increasing. Received new orders.

## 2013-10-31 NOTE — Progress Notes (Signed)
On call physician paged for pt's complaint of headache - no prn meds ordered.

## 2013-11-01 DIAGNOSIS — I10 Essential (primary) hypertension: Secondary | ICD-10-CM

## 2013-11-01 DIAGNOSIS — I9589 Other hypotension: Secondary | ICD-10-CM

## 2013-11-01 DIAGNOSIS — T50995A Adverse effect of other drugs, medicaments and biological substances, initial encounter: Secondary | ICD-10-CM

## 2013-11-01 DIAGNOSIS — E039 Hypothyroidism, unspecified: Secondary | ICD-10-CM

## 2013-11-01 DIAGNOSIS — F411 Generalized anxiety disorder: Secondary | ICD-10-CM

## 2013-11-01 DIAGNOSIS — T50904A Poisoning by unspecified drugs, medicaments and biological substances, undetermined, initial encounter: Secondary | ICD-10-CM

## 2013-11-01 LAB — RENAL FUNCTION PANEL
Albumin: 3.2 g/dL — ABNORMAL LOW (ref 3.5–5.2)
BUN: 81 mg/dL — AB (ref 6–23)
CHLORIDE: 112 meq/L (ref 96–112)
CO2: 13 meq/L — AB (ref 19–32)
Calcium: 9.6 mg/dL (ref 8.4–10.5)
Creatinine, Ser: 4.31 mg/dL — ABNORMAL HIGH (ref 0.50–1.10)
GFR calc non Af Amer: 10 mL/min — ABNORMAL LOW (ref >90)
GFR, EST AFRICAN AMERICAN: 11 mL/min — AB (ref >90)
GLUCOSE: 83 mg/dL (ref 70–99)
Phosphorus: 4.1 mg/dL (ref 2.3–4.6)
Potassium: 5.5 mEq/L — ABNORMAL HIGH (ref 3.7–5.3)
Sodium: 144 mEq/L (ref 137–147)

## 2013-11-01 LAB — CBC
HEMATOCRIT: 28.1 % — AB (ref 36.0–46.0)
Hemoglobin: 9.3 g/dL — ABNORMAL LOW (ref 12.0–15.0)
MCH: 33.3 pg (ref 26.0–34.0)
MCHC: 33.1 g/dL (ref 30.0–36.0)
MCV: 100.7 fL — ABNORMAL HIGH (ref 78.0–100.0)
Platelets: 111 10*3/uL — ABNORMAL LOW (ref 150–400)
RBC: 2.79 MIL/uL — ABNORMAL LOW (ref 3.87–5.11)
RDW: 16.6 % — ABNORMAL HIGH (ref 11.5–15.5)
WBC: 4.5 10*3/uL (ref 4.0–10.5)

## 2013-11-01 MED ORDER — LABETALOL HCL 5 MG/ML IV SOLN
10.0000 mg | INTRAVENOUS | Status: DC | PRN
  Administered 2013-11-01: 10 mg via INTRAVENOUS
  Filled 2013-11-01: qty 4

## 2013-11-01 MED ORDER — LABETALOL HCL 5 MG/ML IV SOLN
20.0000 mg | INTRAVENOUS | Status: DC | PRN
  Administered 2013-11-01 (×2): 20 mg via INTRAVENOUS
  Filled 2013-11-01 (×3): qty 4

## 2013-11-01 MED ORDER — HYDRALAZINE HCL 20 MG/ML IJ SOLN
10.0000 mg | INTRAMUSCULAR | Status: DC | PRN
  Administered 2013-11-01 – 2013-11-02 (×3): 10 mg via INTRAVENOUS
  Filled 2013-11-01 (×4): qty 1

## 2013-11-01 NOTE — Progress Notes (Signed)
PULMONARY / CRITICAL CARE MEDICINE   Name: [name] MRN: [id number] DOB: [birth year]    ADMISSION DATE:  10/30/2013 CONSULTATION DATE:  10/30/2013  REFERRING MD :  EDP PRIMARY SERVICE: PCCM  CHIEF COMPLAINT:  AMS  BRIEF PATIENT DESCRIPTION: 68 year old female with CKD ( refused HD in the past ), HTN, and psych history presented from [facility] 6/26 for AMS. She was bradycardic and hypotensive thought to be related to scheduled beta-blocker use. PCCM asked to consult for admission.  SIGNIFICANT EVENTS / STUDIES:  5/6-5/03/2014 > admission to [facility] for AMS thought secondary to UTI, was bradycardic on admit, labetolol was decreased.   LINES / TUBES:  CULTURES:  ANTIBIOTICS: Blood 6/26 >>> Urine 6/26 >>>   INTERVAL HISTORY: As of Dr Agustina Caroli note 6/26- no HD, no ACLS. She is asking foley be removed. Denies acute issues. 6/28- No acute requests, wants to talk w nurse about her phone numbers  VITAL SIGNS: Temp:  [98.5 F (36.9 C)-98.6 F (37 C)] 98.5 F (36.9 C) (06/28 0535) Pulse Rate:  [65-86] 69 (06/28 0535) Resp:  [15-16] 16 (06/27 1939) BP: (170-200)/(71-88) 170/75 mmHg (06/28 0535) SpO2:  [97 %-98 %] 97 % (06/28 0535) Weight:  [65.9 kg (145 lb 4.5 oz)] 65.9 kg (145 lb 4.5 oz) (06/28 0413) HEMODYNAMICS:   VENTILATOR SETTINGS:   INTAKE / OUTPUT: Intake/Output     06/27 0701 - 06/28 0700 06/28 0701 - 06/29 0700   P.O. 1110    I.V. (mL/kg)     Blood     Total Intake(mL/kg) 1110 (16.8)    Urine (mL/kg/hr) 750 (0.5)    Total Output 750     Net +360          Urine Occurrence 206 x 1 x     PHYSICAL EXAMINATION: General:  Chronically ill appearing female in NAD, feeding herself again Neuro:  Spontaneously awake, alert. Continues mild confusion. Unclear how far this is from baseline.  HEENT:  [place]/AT, No JVD noted, cervical kyphosis Cardiovascular:  Loletha Grayer, regular. 3-7/6 systolic M LUSB, no edema Lungs:  Even, unlabored, Clear anteriorly Abdomen:  Soft, non-tender.   Musculoskeletal: Kyphosis, no acute deformity  Skin:  Several ecchymotic areas to extremities.   LABS:  CBC  Recent Labs Lab 10/30/13 0043 10/30/13 0047 10/30/13 0525 11/01/13 0510  WBC 3.0*  --  4.8 4.5  HGB 7.1* 7.5* 6.4* 9.3*  HCT 21.2* 22.0* 19.5* 28.1*  PLT 144*  --  125* 111*   Coag's No results found for this basename: APTT, INR,  in the last 168 hours BMET  Recent Labs Lab 10/30/13 0043 10/30/13 0047 10/30/13 0525 11/01/13 0510  NA 137 134* 138 144  K 5.5* 5.2 5.5* 5.5*  CL 102 113* 105 112  CO2 18*  --  15* 13*  BUN 88* 99* 88* 81*  CREATININE 5.03* 5.40* 4.73* 4.31*  GLUCOSE 90 84 100* 83   Electrolytes  Recent Labs Lab 10/30/13 0043 10/30/13 0525 11/01/13 0510  CALCIUM 11.4* 10.5 9.6  MG  --  2.5  --   PHOS  --  5.8* 4.1   Sepsis Markers  Recent Labs Lab 10/30/13 0048  LATICACIDVEN <0.30*   ABG No results found for this basename: PHART, PCO2ART, PO2ART,  in the last 168 hours Liver Enzymes  Recent Labs Lab 10/30/13 0043 11/01/13 0510  AST 10  --   ALT 12  --   ALKPHOS 90  --   BILITOT <0.2*  --   ALBUMIN  3.7 3.2*   Cardiac Enzymes  Recent Labs Lab 10/30/13 0043  TROPONINI <0.30   Glucose  Recent Labs Lab 10/30/13 0433 10/30/13 0509 10/30/13 0807 10/30/13 1241 10/30/13 1403  GLUCAP 51* 119* 73 69* 82    IMAGING:   No results found.  ASSESSMENT / PLAN:  PULMONARY A: No active issues P:   No intervention required  CARDIOVASCULAR A:  Sinus bradycardia -  Currently 69 on labetalol Hypotension-resolved H/o  AF Hypertension- labetalol restarted. Has required addition of prn apresoline and iv labetalol  P:  Atropine at bedside Watch BP trend   RENAL A:   CKD - baseline creat 4.3 Hyperkalemia P:   Gentle IVF hydration  Has previously refused dialysis- confirmed Consulted renal 6/27 AM Continue home calcitriol, sevelamer  GASTROINTESTINAL A:   Hx GERD P:   Continue home pepcid Hold home  lubiprostone  HEMATOLOGIC A:   Anemia of chronic illness secondary to CKD   6/28- HgB 9.3 P:  Follow CBC Transfuse per  protocol  INFECTIOUS A:   No overt infection P:   Monitor off ABX for now Follow UA results, Cultures Trend WBC and fever curve  ENDOCRINE A:   Hypothyroid-TSH 0.034 on synthroid 100 mcg. May be over treated P:   Recheck TSH, T4 Continue synthroid but consider dose adjustment  NEUROLOGIC A:   Bipolar disorder Anxiety  P:   Holding sedating medications for now, restart when able ( flexaril, lamictal, oxcarbazepime, risperidone) Continue Xanax to avoid withdrawal    Deneise Lever, MD Pulmonary and [place] Pager: [phone]  11/01/2013, 9:28 AM

## 2013-11-01 NOTE — Progress Notes (Signed)
eLink Physician-Brief Progress Note Patient Name: BATOOL MAJID DOB: 1946-02-28 MRN: [id number]  Date of Service  11/01/2013   HPI/Events of Note   Hypertensive.  eICU Interventions  Will order prn IV labetalol for SBP > 170   Intervention Category Intermediate Interventions: Other:  SOOD,VINEET 11/01/2013, 1:11 AM

## 2013-11-01 NOTE — Progress Notes (Signed)
Patient's b/p came down to 170/75 this am- the lowest reading all night. Will pass on to day nurse

## 2013-11-01 NOTE — Progress Notes (Signed)
Order obtained for CBC this am for prior unit of blood given.

## 2013-11-01 NOTE — Progress Notes (Signed)
Patient's b/p continues to run high. Starting around 194/78 and pulse 66- after hydralazine and labetalol, b/p has gone up to 200/81 and pulse 86. MD called- adding new orders

## 2013-11-02 DIAGNOSIS — N186 End stage renal disease: Secondary | ICD-10-CM

## 2013-11-02 DIAGNOSIS — N189 Chronic kidney disease, unspecified: Secondary | ICD-10-CM

## 2013-11-02 DIAGNOSIS — N179 Acute kidney failure, unspecified: Secondary | ICD-10-CM

## 2013-11-02 LAB — TSH: TSH: 0.042 u[IU]/mL — AB (ref 0.350–4.500)

## 2013-11-02 LAB — T4, FREE: FREE T4: 0.45 ng/dL — AB (ref 0.80–1.80)

## 2013-11-02 MED ORDER — HYDRALAZINE HCL 25 MG PO TABS
25.0000 mg | ORAL_TABLET | Freq: Three times a day (TID) | ORAL | Status: DC
  Administered 2013-11-02 – 2013-11-04 (×7): 25 mg via ORAL
  Filled 2013-11-02 (×9): qty 1

## 2013-11-02 NOTE — Progress Notes (Signed)
PULMONARY / CRITICAL CARE MEDICINE   Name: [name] MRN: [id number] DOB: August 08, 1945    ADMISSION DATE:  10/30/2013 CONSULTATION DATE:  10/30/2013  REFERRING MD :  EDP PRIMARY SERVICE: PCCM  CHIEF COMPLAINT:  AMS  BRIEF PATIENT DESCRIPTION: 68 year old female with CKD ( refused HD in the past ), HTN, and psych history presented from [facility] 6/26 for AMS. She was bradycardic and hypotensive thought to be related to scheduled beta-blocker use. PCCM asked to consult for admission.  SIGNIFICANT EVENTS / STUDIES:  5/6-5/03/2014 > admission to [facility] for AMS thought secondary to UTI, was bradycardic on admit, labetolol was decreased.   LINES / TUBES:  CULTURES: Blood 6/26 >>> Urine 6/26 >>>  >> 100 k non spefic ANTIBIOTICS:    INTERVAL HISTORY: As of Dr Agustina Caroli note 6/26- no HD, no ACLS. She is asking foley be removed. Denies acute issues. 6/28- No acute requests, wants to talk w nurse about her phone numbers 6/29 no current IV line, IV team called VITAL SIGNS: Temp:  [98.5 F (36.9 C)-98.8 F (37.1 C)] 98.5 F (36.9 C) (06/29 0430) Pulse Rate:  [63-71] 69 (06/29 0430) Resp:  [16] 16 (06/29 0430) BP: (140-194)/(68-85) 179/79 mmHg (06/29 0430) SpO2:  [96 %-99 %] 97 % (06/29 0430) Weight:  [145 lb 4.5 oz (65.9 kg)] 145 lb 4.5 oz (65.9 kg) (06/29 0430) HEMODYNAMICS:   VENTILATOR SETTINGS:   INTAKE / OUTPUT: Intake/Output     06/28 0701 - 06/29 0700 06/29 0701 - 06/30 0700   P.O. 220    I.V. (mL/kg) 635.8 (9.6)    Total Intake(mL/kg) 855.8 (13)    Urine (mL/kg/hr)  300 (1.7)   Total Output   300   Net +855.8 -300        Urine Occurrence 5 x    Stool Occurrence 3 x      PHYSICAL EXAMINATION: General:  Chronically ill appearing female in NAD, feeding herself  Neuro:  Spontaneously awake, alert. Continues mild confusion. Unclear how far this is from baseline.  HEENT:  [place]/AT, No JVD noted, cervical kyphosis Cardiovascular:  Loletha Grayer, regular. 3-7/9 systolic M LUSB, no  edema Lungs:  Even, unlabored, Clear anteriorly, diminished in bases Abdomen:  Soft, non-tender.  Musculoskeletal: Kyphosis, no acute deformity  Skin:  Several ecchymotic areas to extremities.   LABS:  CBC  Recent Labs Lab 10/30/13 0043 10/30/13 0047 10/30/13 0525 11/01/13 0510  WBC 3.0*  --  4.8 4.5  HGB 7.1* 7.5* 6.4* 9.3*  HCT 21.2* 22.0* 19.5* 28.1*  PLT 144*  --  125* 111*   Coag's No results found for this basename: APTT, INR,  in the last 168 hours BMET  Recent Labs Lab 10/30/13 0043 10/30/13 0047 10/30/13 0525 11/01/13 0510  NA 137 134* 138 144  K 5.5* 5.2 5.5* 5.5*  CL 102 113* 105 112  CO2 18*  --  15* 13*  BUN 88* 99* 88* 81*  CREATININE 5.03* 5.40* 4.73* 4.31*  GLUCOSE 90 84 100* 83   Electrolytes  Recent Labs Lab 10/30/13 0043 10/30/13 0525 11/01/13 0510  CALCIUM 11.4* 10.5 9.6  MG  --  2.5  --   PHOS  --  5.8* 4.1   Sepsis Markers  Recent Labs Lab 10/30/13 0048  LATICACIDVEN <0.30*   ABG No results found for this basename: PHART, PCO2ART, PO2ART,  in the last 168 hours Liver Enzymes  Recent Labs Lab 10/30/13 0043 11/01/13 0510  AST 10  --   ALT 12  --  ALKPHOS 90  --   BILITOT <0.2*  --   ALBUMIN 3.7 3.2*   Cardiac Enzymes  Recent Labs Lab 10/30/13 0043  TROPONINI <0.30   Glucose  Recent Labs Lab 10/30/13 0433 10/30/13 0509 10/30/13 0807 10/30/13 1241 10/30/13 1403  GLUCAP 51* 119* 73 69* 82    IMAGING:   No results found.  ASSESSMENT / PLAN:  PULMONARY A: No active issues P:   No intervention required  CARDIOVASCULAR A:  Sinus bradycardia -  Currently 71 on labetalol Hypotension-resolved H/o  AF Hypertension- labetalol restarted. Has required addition of prn apresoline and iv labetalol ,poor control Consider apresoline po daily not PRN. P:  Watch BP trend   RENAL Lab Results  Component Value Date   CREATININE 4.31* 11/01/2013   CREATININE 4.73* 10/30/2013   CREATININE 5.40* 10/30/2013    CREATININE 1.52* 12/31/2008    A:   CKD - baseline creat 4.3 Hyperkalemia P:   Gentle IVF hydration  Has previously refused dialysis- confirmed Consulted renal 6/27 AM Continue home calcitriol, sevelamer  GASTROINTESTINAL A:   Hx GERD P:   Continue home pepcid Hold home lubiprostone  HEMATOLOGIC  Recent Labs  11/01/13 0510  HGB 9.3*    A:   Anemia of chronic illness secondary to CKD   6/28- HgB 9.3 P:  Follow CBC Transfuse per  protocol  INFECTIOUS A:   No overt infection P:   Monitor off ABX for now Follow UA results, Cultures Trend WBC and fever curve  ENDOCRINE A:   Hypothyroid-TSH 0.034 on synthroid 100 mcg. May be over treated P:   Recheck TSH, T4 Continue synthroid but consider dose adjustment  NEUROLOGIC A:   Bipolar disorder Anxiety  P:   Holding sedating medications for now, restart when able ( flexaril, lamictal, oxcarbazepime, risperidone) Continue Xanax to avoid withdrawal  Back to NH soon.  Richardson Landry Minor ACNP Maryanna Shape PCCM Pager [phone] till 3 pm If no answer page [phone] 11/02/2013, 9:52 AM  Will likely be able to transfer back to SNF in the next 1 or 2 days.  Patient seen and examined, agree with above note.  I dictated the care and orders written for this patient under my direction.  Rush Farmer, MD [phone]

## 2013-11-03 DIAGNOSIS — G9340 Encephalopathy, unspecified: Secondary | ICD-10-CM

## 2013-11-03 LAB — BASIC METABOLIC PANEL
BUN: 84 mg/dL — ABNORMAL HIGH (ref 6–23)
CHLORIDE: 115 meq/L — AB (ref 96–112)
CO2: 14 mEq/L — ABNORMAL LOW (ref 19–32)
Calcium: 10 mg/dL (ref 8.4–10.5)
Creatinine, Ser: 4.14 mg/dL — ABNORMAL HIGH (ref 0.50–1.10)
GFR calc Af Amer: 12 mL/min — ABNORMAL LOW (ref >90)
GFR, EST NON AFRICAN AMERICAN: 10 mL/min — AB (ref >90)
Glucose, Bld: 87 mg/dL (ref 70–99)
Potassium: 5.2 mEq/L (ref 3.7–5.3)
SODIUM: 147 meq/L (ref 137–147)

## 2013-11-03 MED ORDER — ALPRAZOLAM 0.25 MG PO TABS
0.2500 mg | ORAL_TABLET | Freq: Every evening | ORAL | Status: AC | PRN
  Administered 2013-11-03: 0.25 mg via ORAL

## 2013-11-03 MED ORDER — LEVOTHYROXINE SODIUM 75 MCG PO TABS
75.0000 ug | ORAL_TABLET | Freq: Every day | ORAL | Status: DC
  Administered 2013-11-04: 75 ug via ORAL
  Filled 2013-11-03 (×2): qty 1

## 2013-11-03 NOTE — Progress Notes (Signed)
PULMONARY / CRITICAL CARE MEDICINE   Name: [name] MRN: [id number] DOB: 1946/04/08    ADMISSION DATE:  10/30/2013 CONSULTATION DATE:  10/30/2013  REFERRING MD :  EDP PRIMARY SERVICE: PCCM  CHIEF COMPLAINT:  AMS  BRIEF PATIENT DESCRIPTION: 68 year old female with CKD ( refused HD in the past ), HTN, and psych history presented from ALF 6/26 for AMS. She was bradycardic and hypotensive thought to be related to scheduled beta-blocker use. PCCM asked to consult for admission.  SIGNIFICANT EVENTS / STUDIES:  5/6-5/03/2014 > admission to [facility] for AMS thought secondary to UTI, was bradycardic on admit, labetolol was decreased.  6/30 appears to have reache MHB LINES / TUBES:  CULTURES: Blood 6/26 >>> Urine 6/26 >>>  >> 100 k non spefic ANTIBIOTICS:    INTERVAL HISTORY: As of Dr Agustina Caroli note 6/26- no HD, no ACLS. She is asking foley be removed. Denies acute issues. 6/28- No acute requests, wants to talk w nurse about her phone numbers 6/29 no current IV line, IV team called 6/30 case manager consult for return to SNF. VITAL SIGNS: Temp:  [98.4 F (36.9 C)-98.7 F (37.1 C)] 98.7 F (37.1 C) (06/30 0610) Pulse Rate:  [69-80] 70 (06/30 0911) Resp:  [20] 20 (06/30 0610) BP: (108-186)/(65-83) 150/68 mmHg (06/30 0911) SpO2:  [96 %-99 %] 97 % (06/30 0610) Weight:  [140 lb 6.9 oz (63.7 kg)] 140 lb 6.9 oz (63.7 kg) (06/30 0435) HEMODYNAMICS:   VENTILATOR SETTINGS:   INTAKE / OUTPUT: Intake/Output     06/29 0701 - 06/30 0700 06/30 0701 - 07/01 0700   P.O. 725    I.V. (mL/kg) 1213.3 (19)    Total Intake(mL/kg) 1938.3 (30.4)    Urine (mL/kg/hr) 1001 (0.7) 200 (1.2)   Total Output 1001 200   Net +937.3 -200        Urine Occurrence 3 x 1 x   Stool Occurrence 2 x      PHYSICAL EXAMINATION: General:  Chronically ill appearing female in NAD, feeding herself , better. Neuro:  Spontaneously awake, alert. Continues mild confusion. Unclear how far this is from baseline.  HEENT:   [place]/AT, No JVD noted, cervical kyphosis Cardiovascular:  Loletha Grayer, regular. 5-4/2 systolic M LUSB, no edema Lungs:  Even, unlabored, Clear anteriorly, diminished in bases Abdomen:  Soft, non-tender.  Musculoskeletal: Kyphosis, no acute deformity  Skin:  Several ecchymotic areas to extremities.   LABS:  CBC  Recent Labs Lab 10/30/13 0043 10/30/13 0047 10/30/13 0525 11/01/13 0510  WBC 3.0*  --  4.8 4.5  HGB 7.1* 7.5* 6.4* 9.3*  HCT 21.2* 22.0* 19.5* 28.1*  PLT 144*  --  125* 111*   Coag's No results found for this basename: APTT, INR,  in the last 168 hours BMET  Recent Labs Lab 10/30/13 0525 11/01/13 0510 11/03/13 0524  NA 138 144 147  K 5.5* 5.5* 5.2  CL 105 112 115*  CO2 15* 13* 14*  BUN 88* 81* 84*  CREATININE 4.73* 4.31* 4.14*  GLUCOSE 100* 83 87   Electrolytes  Recent Labs Lab 10/30/13 0525 11/01/13 0510 11/03/13 0524  CALCIUM 10.5 9.6 10.0  MG 2.5  --   --   PHOS 5.8* 4.1  --    Sepsis Markers  Recent Labs Lab 10/30/13 0048  LATICACIDVEN <0.30*   ABG No results found for this basename: PHART, PCO2ART, PO2ART,  in the last 168 hours Liver Enzymes  Recent Labs Lab 10/30/13 0043 11/01/13 0510  AST 10  --  ALT 12  --   ALKPHOS 90  --   BILITOT <0.2*  --   ALBUMIN 3.7 3.2*   Cardiac Enzymes  Recent Labs Lab 10/30/13 0043  TROPONINI <0.30   Glucose  Recent Labs Lab 10/30/13 0433 10/30/13 0509 10/30/13 0807 10/30/13 1241 10/30/13 1403  GLUCAP 51* 119* 73 69* 82    IMAGING:   No results found.  ASSESSMENT / PLAN:  PULMONARY A: No active issues P:   No intervention required  CARDIOVASCULAR A:  Sinus bradycardia -  Currently 71 on labetalol Hypotension-resolved H/o  AF Hypertension- labetalol restarted. Has required addition of prn apresoline and iv labetalol ,poor control  apresoline po daily not PRN. P:  Watch BP trend, improved on apresoline   RENAL Lab Results  Component Value Date   CREATININE 4.14*  11/03/2013   CREATININE 4.31* 11/01/2013   CREATININE 4.73* 10/30/2013   CREATININE 1.52* 12/31/2008    Recent Labs Lab 10/30/13 0525 11/01/13 0510 11/03/13 0524  K 5.5* 5.5* 5.2      A:   CKD - baseline creat 4.3 Hyperkalemia(improving) P:   Gentle IVF hydration  Has previously refused dialysis- confirmed Consulted renal 6/27 AM Continue home calcitriol, sevelamer  GASTROINTESTINAL A:   Hx GERD P:   Continue home pepcid Hold home lubiprostone  HEMATOLOGIC  Recent Labs  11/01/13 0510  HGB 9.3*    A:   Anemia of chronic illness secondary to CKD   6/28- HgB 9.3 P:  Follow CBC Transfuse per  protocol  INFECTIOUS A:   No overt infection P:   Monitor off ABX for now Follow UA results, Cultures Trend WBC and fever curve  ENDOCRINE A:   Hypothyroid-TSH 0.034 on synthroid 100 mcg. May be over treated P:   Recheck TSH, T4 Continue synthroid but decreased to 75 mcg and will follow up TSH at SNF.  NEUROLOGIC A:   Bipolar disorder Anxiety  P:   Holding sedating medications for now, restart when able ( flexaril, lamictal, oxcarbazepime, risperidone) Resume  at SNF. Continue Xanax to avoid withdrawal  Back to NH now if possible  Richardson Landry Minor ACNP Maryanna Shape PCCM Pager [phone] till 3 pm If no answer page [phone] 11/03/2013, 9:34 AM  Awaiting placement at this point but can not return to ALF, needs to SNF.  Patient seen and examined, agree with above note.  I dictated the care and orders written for this patient under my direction.  Rush Farmer, MD [phone]

## 2013-11-03 NOTE — Evaluation (Signed)
Physical Therapy Evaluation Patient Details Name: [name] MRN: [id number] DOB: [birth year] Today's Date: 11/03/2013   History of Present Illness  68 yo female admitted with AMS, UTI , bradycardia. PTA lives at [place] with husband Shanon Brow. Pt with hx of  falls . hx of psychiatric illness (bipolar and schizophrenia)    Clinical Impression  Pt initially very anxious and unsure if she could mobilize requiring call to [facility] for PLOF as well as encouragement to pt to mobilize. Pt benefits from reassurance as well as family encouragement and increased time to participate. Pt with decreasing function for the last 3-4wks per sister and agrees to ST-SNF prior to return to ALF with spouse. If ALF able to provide min assist for all mobility and ADLs then recommend return to ALF with HHPT but if they cannot provide that level of care would recommend ST-SNF. Will follow acutely to maximize strength, function, gait and safety with pt encouraged to be OOB for meals as well as continue HEP.     Follow Up Recommendations SNF;Supervision/Assistance - 24 hour    Equipment Recommendations  None recommended by PT    Recommendations for Other Services       Precautions / Restrictions Precautions Precautions: Fall      Mobility  Bed Mobility Overal bed mobility: Needs Assistance Bed Mobility: Supine to Sit     Supine to sit: Min guard     General bed mobility comments: cues for hand placement and safety  Transfers Overall transfer level: Needs assistance   Transfers: Sit to/from Stand;Stand Pivot Transfers Sit to Stand: Min guard Stand pivot transfers: Min guard       General transfer comment: cues for hand placement and safety  Ambulation/Gait Ambulation/Gait assistance:  (pt declined)              Stairs            Wheelchair Mobility    Modified Rankin (Stroke Patients Only)       Balance Overall balance assessment: Needs assistance   Sitting balance-Leahy Scale:  Fair       Standing balance-Leahy Scale: Poor                               Pertinent Vitals/Pain No pain    Home Living Family/patient expects to be discharged to:: Skilled nursing facility                 Additional Comments: lives at [facility] with husband. Pt has been using a w/c more for the last 4 weeks with progressive weakness due to medical issues and  regression with mental illness. Sister [name] states "when she has medical issues it causes trouble with her mental illness and she wont be able to walk for a few weeks then returns to normal as it gets better. typically there is no physical reason she can't move"    Prior Function Level of Independence: Needs assistance   Gait / Transfers Assistance Needed: pt normally can walk to dining hall in AM, uses WC in pm  ADL's / Homemaking Assistance Needed: pt has supervision for dressing, sponge bathes in standing on her own, and has assist for shower 1x/wk  Comments: per sister she would like pt to be in SNF but keeps her at ALF to be with spouse and normally she moves well enough staff can handle her     Hand Dominance  Extremity/Trunk Assessment   Upper Extremity Assessment: Generalized weakness           Lower Extremity Assessment: Generalized weakness      Cervical / Trunk Assessment: Kyphotic;Other exceptions  Communication   Communication: No difficulties  Cognition Arousal/Alertness: Awake/alert Behavior During Therapy: Anxious Overall Cognitive Status: History of cognitive impairments - at baseline                      General Comments      Exercises General Exercises - Lower Extremity Long Arc Quad: AROM;Seated;Both;10 reps Hip Flexion/Marching: AROM;Seated;Both;10 reps      Assessment/Plan    PT Assessment Patient needs continued PT services  PT Diagnosis Difficulty walking;Generalized weakness;Altered mental status   PT Problem List Decreased  strength;Decreased activity tolerance;Decreased balance;Decreased safety awareness;Decreased knowledge of use of DME;Decreased mobility  PT Treatment Interventions Gait training;Functional mobility training;Therapeutic activities;Therapeutic exercise;Patient/family education;DME instruction;Balance training   PT Goals (Current goals can be found in the Care Plan section) Acute Rehab PT Goals Patient Stated Goal: be able to return with dave PT Goal Formulation: With patient Time For Goal Achievement: 11/17/13 Potential to Achieve Goals: Fair    Frequency Min 2X/week   Barriers to discharge Decreased caregiver support      Co-evaluation               End of Session   Activity Tolerance: Patient tolerated treatment well Patient left: in chair;with call bell/phone within reach;with chair alarm set Nurse Communication: Mobility status         Time: 1335-1406 PT Time Calculation (min): 31 min   Charges:   PT Evaluation $Initial PT Evaluation Tier I: 1 Procedure PT Treatments $Therapeutic Activity: 8-22 mins   PT G CodesMelford Aase 11/03/2013, 2:17 PM Elwyn Reach, [place]

## 2013-11-03 NOTE — Clinical Social Work Note (Signed)
Patient has SNF bed offers and patient/family is prepared for a DC on 11/04/13.  Liz Beach MSW, [place], [place], [phone]

## 2013-11-03 NOTE — Progress Notes (Signed)
Pt anxious - no prns ordered - on call physician notified.

## 2013-11-03 NOTE — Progress Notes (Signed)
CARE MANAGEMENT NOTE 11/03/2013  Patient:  [name], [name]   Account Number:  [id number]  Date Initiated:  10/30/2013  Documentation initiated by:  [facility]  Subjective/Objective Assessment:   Admitted with hypotension, bradycardic, renal failure.  Patient from ALF     Action/Plan:   PT eval-recommended SNF   Anticipated DC Date:  11/05/2013   Anticipated DC Plan:  [place] referral  Clinical Social Worker      DC Planning Services  CM consult      Choice offered to / List presented to:             Status of service:  In process, will continue to follow Medicare Important Message given?  YES (If response is "NO", the following Medicare IM given date fields will be blank) Date Medicare IM given:  11/03/2013 Medicare IM given by:  [facility] Date Additional Medicare IM given:   Additional Medicare IM given by:    Discharge Disposition:    Per UR Regulation:  Reviewed for med. necessity/level of care/duration of stay  If discussed at [place] of Stay Meetings, dates discussed:    Comments:  ContactEliezer Mccoy Sister [phone]  11/03/13 PT eval recommended SNF.Referral made to CSW. Patient from ALF.CM will continue to follow. Fuller Plan RN, BSN, CCM

## 2013-11-04 LAB — BASIC METABOLIC PANEL
BUN: 80 mg/dL — ABNORMAL HIGH (ref 6–23)
CHLORIDE: 116 meq/L — AB (ref 96–112)
CO2: 15 meq/L — AB (ref 19–32)
Calcium: 9.7 mg/dL (ref 8.4–10.5)
Creatinine, Ser: 4.45 mg/dL — ABNORMAL HIGH (ref 0.50–1.10)
GFR calc non Af Amer: 9 mL/min — ABNORMAL LOW (ref >90)
GFR, EST AFRICAN AMERICAN: 11 mL/min — AB (ref >90)
Glucose, Bld: 82 mg/dL (ref 70–99)
Potassium: 5.2 mEq/L (ref 3.7–5.3)
SODIUM: 147 meq/L (ref 137–147)

## 2013-11-04 MED ORDER — LEVOTHYROXINE SODIUM 75 MCG PO TABS: 75.0000 ug | ORAL_TABLET | Freq: Every day | ORAL | Status: AC

## 2013-11-04 MED ORDER — ALPRAZOLAM 0.25 MG PO TABS: 0.2500 mg | ORAL_TABLET | Freq: Three times a day (TID) | ORAL | Status: AC

## 2013-11-04 MED ORDER — RISPERIDONE 2 MG PO TABS: 2.0000 mg | ORAL_TABLET | Freq: Two times a day (BID) | ORAL | Status: AC

## 2013-11-04 MED ORDER — HYDRALAZINE HCL 25 MG PO TABS: 25.0000 mg | ORAL_TABLET | Freq: Three times a day (TID) | ORAL | Status: AC

## 2013-11-04 NOTE — Discharge Summary (Signed)
Physician Discharge Summary  Patient ID: [name] MRN: [id number] DOB/AGE: 06/01/1945 68 y.o.  Admit date: 10/30/2013 Discharge date: 11/04/2013  Problem List Active Problems:   CKD (chronic kidney disease)   Essential hypertension, benign  HPI:  68 year old female with history as below, which includes CKD (has refused dialysis in past), HTN, A-fib, Bipolar, and anxiety. Recent admission for AMS in May. This was thought to be secondary to UTI. She was treated with ABX and discharged to [facility] 5/11 as DNR status. Of note, during that admission she was found to be bradycardic while on labetolol 300mg  BID. This dose was reduced to 200mg  BID. 6/26 she presented to [facility] ED again for AMS. In ED she was noted to be mildly lethargic, hypotensive, and bradycardic (30's). Her CKD is also worse than prior admission. PCCM asked to see for admission.  PAST MEDICAL HISTORY :  Past Medical History   Diagnosis  Date   .  Constipation    .  Bipolar 1 disorder    .  Hypertension    .  Esophageal reflux    .  Anxiety    .  Back pain    .  Osteoarthritis    .  Gout    .  Adenomatous polyps  03-27-07   .  Diverticulosis    .  Internal hemorrhoids    .  Hypothyroidism    .  Renal insufficiency    .  Atrial fibrillation    .  CKD (chronic kidney disease), stage IV    .  Kyphosis     Past Surgical History   Procedure  Laterality  Date   .  Cholecystectomy     .  Appendectomy     .  Tonsillectomy     .  Tubal ligation       Hospital Course:  CULTURES:  Blood 6/26 >>> ngtd Urine 6/26 >>> >> 100 k non spefic  INTERVAL HISTORY: As of Dr Agustina Caroli note 6/26- no HD, no ACLS. She is asking foley be removed. Denies acute issues.  6/28- No acute requests, wants to talk w nurse about her phone numbers  6/29 no current IV line, IV team called  6/30 case manager consult for return to SNF. ASSESSMENT / PLAN:  PULMONARY  A:  No active issues  P:  No intervention required  CARDIOVASCULAR  A:  Sinus  bradycardia - Currently 71 on labetalol  Hypotension-resolved  H/o AF  Hypertension- labetalol restarted. Has required addition of prn apresoline and iv labetalol ,poor control  apresoline po daily not PRN.  P:  Watch BP trend, improved on apresoline  RENAL  Lab Results   Component  Value  Date    CREATININE  4.14*  11/03/2013    CREATININE  4.31*  11/01/2013    CREATININE  4.73*  10/30/2013    CREATININE  1.52*  12/31/2008     Recent Labs  Lab  10/30/13 0525  11/01/13 0510  11/03/13 0524   K  5.5*  5.5*  5.2    A:  CKD - baseline creat 4.3  Hyperkalemia(improving)  P:  Gentle IVF hydration  Has previously refused dialysis- confirmed  Consulted renal 6/27 AM  Continue home calcitriol, sevelamer  GASTROINTESTINAL  A:  Hx GERD  P:  Continue home pepcid  Hold home lubiprostone  HEMATOLOGIC   Recent Labs   11/01/13 0510   HGB  9.3*    A:  Anemia of chronic illness secondary  to CKD 6/28- HgB 9.3  P:  Follow CBC  Transfuse per protocol  INFECTIOUS  A:  No overt infection  P:  Monitor off ABX for now  Follow UA results, Cultures  Trend WBC and fever curve  ENDOCRINE  A:  Hypothyroid-TSH 0.034 on synthroid 100 mcg. May be over treated  P:  Recheck TSH, T4  Continue synthroid but decreased to 75 mcg and will follow up TSH at SNF.  NEUROLOGIC  A:  Bipolar disorder  Anxiety  P:  Resume outpatient medications but xanax at lower dose..     Labs at discharge Lab Results  Component Value Date   CREATININE 4.45* 11/04/2013   BUN 80* 11/04/2013   NA 147 11/04/2013   K 5.2 11/04/2013   CL 116* 11/04/2013   CO2 15* 11/04/2013   Lab Results  Component Value Date   WBC 4.5 11/01/2013   HGB 9.3* 11/01/2013   HCT 28.1* 11/01/2013   MCV 100.7* 11/01/2013   PLT 111* 11/01/2013   Lab Results  Component Value Date   ALT 12 10/30/2013   AST 10 10/30/2013   ALKPHOS 90 10/30/2013   BILITOT <0.2* 10/30/2013   Lab Results  Component Value Date   INR 1.44 11/27/2009   INR  1.58* 11/25/2009    Current radiology studies No results found.  Disposition:  04-Intermediate Care Facility     Medication List    STOP taking these medications       amLODipine 10 MG tablet  Commonly known as:  NORVASC      TAKE these medications       acetaminophen 500 MG tablet  Commonly known as:  TYLENOL  Take 500 mg by mouth every 4 (four) hours as needed for moderate pain.     ALPRAZolam 0.25 MG tablet  Commonly known as:  XANAX  Take 1 tablet (0.25 mg total) by mouth 4 (four) times daily -  before meals and at bedtime.     calcitRIOL 0.25 MCG capsule  Commonly known as:  ROCALTROL  Take 0.5 mcg by mouth daily.     Cranberry 425 MG Caps  Take 1 capsule by mouth 2 (two) times daily.     docusate sodium 100 MG capsule  Commonly known as:  COLACE  Take 200-300 mg by mouth 2 (two) times daily. 3 caps in am, 2 caps in pm     ergocalciferol 50000 UNITS capsule  Commonly known as:  VITAMIN D2  Take 50,000 Units by mouth once a week. On thursday     famotidine 20 MG tablet  Commonly known as:  PEPCID  Take 1 tablet (20 mg total) by mouth daily.     guaifenesin 100 MG/5ML syrup  Commonly known as:  ROBITUSSIN  Take 200 mg by mouth 4 (four) times daily as needed for cough.     hydrALAZINE 25 MG tablet  Commonly known as:  APRESOLINE  Take 1 tablet (25 mg total) by mouth every 8 (eight) hours.     labetalol 100 MG tablet  Commonly known as:  NORMODYNE  Take 1 tablet (100 mg total) by mouth 2 (two) times daily.     lamoTRIgine 200 MG tablet  Commonly known as:  LAMICTAL  Take 200 mg by mouth 2 (two) times daily.     levothyroxine 75 MCG tablet  Commonly known as:  SYNTHROID, LEVOTHROID  Take 1 tablet (75 mcg total) by mouth daily before breakfast.     LINZESS 290 MCG Caps  capsule  Generic drug:  Linaclotide  Take 290 mcg by mouth daily.     magnesium hydroxide 400 MG/5ML suspension  Commonly known as:  MILK OF MAGNESIA  Take 30 mLs by mouth daily  as needed for constipation.     meclizine 25 MG tablet  Commonly known as:  ANTIVERT  Take 25 mg by mouth 3 (three) times daily as needed for dizziness.     Oxcarbazepine 300 MG tablet  Commonly known as:  TRILEPTAL  Take 900 mg by mouth 2 (two) times daily.     polyethylene glycol packet  Commonly known as:  MIRALAX / GLYCOLAX  Take 17 g by mouth daily.     risperiDONE 2 MG tablet  Commonly known as:  RISPERDAL  Take 1 tablet (2 mg total) by mouth 2 (two) times daily.     sevelamer carbonate 800 MG tablet  Commonly known as:  RENVELA  Take 1 tablet (800 mg total) by mouth 3 (three) times daily with meals.     sodium phosphate enema  Commonly known as:  FLEET  Place 1 enema rectally as needed (for constipation, may self-administer.).          Discharged Condition: fair  Time spent on discharge greater than 40 minutes.  Vital signs at Discharge. Temp:  [98.3 F (36.8 C)-98.7 F (37.1 C)] 98.7 F (37.1 C) (07/01 0444) Pulse Rate:  [62-70] 70 (07/01 0922) Resp:  [18-20] 20 (07/01 0444) BP: (156-188)/(69-89) 160/74 mmHg (07/01 0922) SpO2:  [96 %-98 %] 98 % (07/01 0444) Weight:  [141 lb 1.5 oz (64 kg)] 141 lb 1.5 oz (64 kg) (07/01 0421) Office follow up Special Information or instructions. Per [facility] service. Signed: Richardson Landry Minor ACNP Maryanna Shape PCCM Pager [phone] till 3 pm If no answer page [phone] 11/04/2013, 11:13 AM  Patient seen and examined, agree with above note.  I dictated the care and orders written for this patient under my direction.  Rush Farmer, MD [phone]

## 2013-11-04 NOTE — Progress Notes (Signed)
Nsg Discharge Note  Admit Date:  10/30/2013 Discharge date: 11/04/2013   [place] to be D/C'd Nursing Home per MD order.  AVS completed.  Copy for chart, and copy for patient signed, and dated. Patient/caregiver able to verbalize understanding.  Discharge Medication:   Medication List    STOP taking these medications       amLODipine 10 MG tablet  Commonly known as:  NORVASC      TAKE these medications       acetaminophen 500 MG tablet  Commonly known as:  TYLENOL  Take 500 mg by mouth every 4 (four) hours as needed for moderate pain.     ALPRAZolam 0.25 MG tablet  Commonly known as:  XANAX  Take 1 tablet (0.25 mg total) by mouth 4 (four) times daily -  before meals and at bedtime.     calcitRIOL 0.25 MCG capsule  Commonly known as:  ROCALTROL  Take 0.5 mcg by mouth daily.     Cranberry 425 MG Caps  Take 1 capsule by mouth 2 (two) times daily.     docusate sodium 100 MG capsule  Commonly known as:  COLACE  Take 200-300 mg by mouth 2 (two) times daily. 3 caps in am, 2 caps in pm     ergocalciferol 50000 UNITS capsule  Commonly known as:  VITAMIN D2  Take 50,000 Units by mouth once a week. On thursday     famotidine 20 MG tablet  Commonly known as:  PEPCID  Take 1 tablet (20 mg total) by mouth daily.     guaifenesin 100 MG/5ML syrup  Commonly known as:  ROBITUSSIN  Take 200 mg by mouth 4 (four) times daily as needed for cough.     hydrALAZINE 25 MG tablet  Commonly known as:  APRESOLINE  Take 1 tablet (25 mg total) by mouth every 8 (eight) hours.     labetalol 100 MG tablet  Commonly known as:  NORMODYNE  Take 1 tablet (100 mg total) by mouth 2 (two) times daily.     lamoTRIgine 200 MG tablet  Commonly known as:  LAMICTAL  Take 200 mg by mouth 2 (two) times daily.     levothyroxine 75 MCG tablet  Commonly known as:  SYNTHROID, LEVOTHROID  Take 1 tablet (75 mcg total) by mouth daily before breakfast.     LINZESS 290 MCG Caps capsule  Generic drug:   Linaclotide  Take 290 mcg by mouth daily.     magnesium hydroxide 400 MG/5ML suspension  Commonly known as:  MILK OF MAGNESIA  Take 30 mLs by mouth daily as needed for constipation.     meclizine 25 MG tablet  Commonly known as:  ANTIVERT  Take 25 mg by mouth 3 (three) times daily as needed for dizziness.     Oxcarbazepine 300 MG tablet  Commonly known as:  TRILEPTAL  Take 900 mg by mouth 2 (two) times daily.     polyethylene glycol packet  Commonly known as:  MIRALAX / GLYCOLAX  Take 17 g by mouth daily.     risperiDONE 2 MG tablet  Commonly known as:  RISPERDAL  Take 1 tablet (2 mg total) by mouth 2 (two) times daily.     sevelamer carbonate 800 MG tablet  Commonly known as:  RENVELA  Take 1 tablet (800 mg total) by mouth 3 (three) times daily with meals.     sodium phosphate enema  Commonly known as:  FLEET  Place 1 enema rectally  as needed (for constipation, may self-administer.).        Discharge Assessment: Filed Vitals:   11/04/13 1439  BP: 166/68  Pulse: 61  Temp: 98.8 F (37.1 C)  Resp: 18   Skin clean, dry and intact without evidence of skin break down, no evidence of skin tears noted. IV catheter discontinued intact. Site without signs and symptoms of complications - no redness or edema noted at insertion site, patient denies c/o pain - only slight tenderness at site.  Dressing with slight pressure applied.  D/c Instructions-Education: Discharge instructions given to patient/family with verbalized understanding. D/c education completed with patient/family including follow up instructions, medication list, d/c activities limitations if indicated, with other d/c instructions as indicated by MD - patient able to verbalize understanding, all questions fully answered. Patient instructed to return to ED, call 911, or call MD for any changes in condition.  Patient escorted via EMS to Nursing Home, report called into [facility].  Dayle Points, RN 11/04/2013 5:42  PM

## 2013-11-04 NOTE — Clinical Social Work Placement (Signed)
Clinical Social Work Department CLINICAL SOCIAL WORK PLACEMENT NOTE 11/04/2013  Patient:  [name], [name]  Account Number:  [id number] Admit date:  10/30/2013  Clinical Social Worker:  Lovey Newcomer  Date/time:  11/04/2013 08:54 AM  Clinical Social Work is seeking post-discharge placement for this patient at the following level of care:   [employer]   (*CSW will update this form in Epic as items are completed)   11/04/2013  Patient/family provided with [place] Department of Clinical Social Work's list of facilities offering this level of care within the geographic area requested by the patient (or if unable, by the patient's family).  11/04/2013  Patient/family informed of their freedom to choose among providers that offer the needed level of care, that participate in Medicare, Medicaid or managed care program needed by the patient, have an available bed and are willing to accept the patient.  11/04/2013  Patient/family informed of [facility]' ownership interest in [facility], as well as of the fact that they are under no obligation to receive care at this facility.  PASARR submitted to EDS on  PASARR number received on   FL2 transmitted to all facilities in geographic area requested by pt/family on  11/04/2013 FL2 transmitted to all facilities within larger geographic area on   Patient informed that his/her managed care company has contracts with or will negotiate with  certain facilities, including the following:     Patient/family informed of bed offers received:  11/04/2013 Patient chooses bed at [facility] Physician recommends and patient chooses bed at    Patient to be transferred to [place] on  11/04/2013 Patient to be transferred to facility by Ambulance Patient and family notified of transfer on 11/04/2013 Name of family member notified:  [name]  The following physician request were  entered in Epic:   Additional Comments: Per MD patient ready to DC to [facility], patient, sister [name], and facility notified of DC. RN given number for report. Dc packet on chart. CSW requested ambulance transport for 2:30PM. CSW signing off at this time.   Liz Beach MSW, [place], [place], [phone]

## 2013-11-04 NOTE — Clinical Social Work Psychosocial (Signed)
Clinical Social Work Department BRIEF PSYCHOSOCIAL ASSESSMENT 11/04/2013  Patient:  [name], [name]     Account Number:  [id number]     Admit date:  10/30/2013  Clinical Social Worker:  Lovey Newcomer  Date/Time:  11/03/2013 08:49 AM  Referred by:  Physician  Date Referred:  11/04/2013 Referred for  SNF Placement   Other Referral:   Interview type:  Patient Other interview type:   Patient and sister interviewed to complete assessment. Patient AOx4    PSYCHOSOCIAL DATA Living Status:  FACILITY Admitted from facility:  [facility] Level of care:  Assisted Living Primary support name:  [name] Primary support relationship to patient:  SIBLING Degree of support available:   Support is good.    CURRENT CONCERNS Current Concerns  Post-Acute Placement   Other Concerns:    SOCIAL WORK ASSESSMENT / PLAN CSW met with patient at bedside to complete assessment. Patient confirms that she is from [facility] and agrees to go to SNF for short term rehab. Patient and sister would prefer [facility] as patient has been to this facility in the past. CSW explained SNF search/placement process to patient and sister and answered questions. Patient seems optimisitc about going to rehab.   Assessment/plan status:  Psychosocial Support/Ongoing Assessment of Needs Other assessment/ plan:   Complete Fl2, Fax, PASRR   Information/referral to community resources:   CSW contact info given    PATIENT'S/FAMILY'S RESPONSE TO PLAN OF CARE: Patient and sister agreeable to DC to SNF with eventual return to ALF. CSW will assist.       Liz Beach MSW, [place], [place], [phone]

## 2013-11-04 NOTE — Clinical Social Work Placement (Signed)
Clinical Social Work Department CLINICAL SOCIAL WORK PLACEMENT NOTE 11/04/2013  Patient:  [name], [name]  Account Number:  [id number] Admit date:  10/30/2013  Clinical Social Worker:  Lovey Newcomer  Date/time:  11/04/2013 08:54 AM  Clinical Social Work is seeking post-discharge placement for this patient at the following level of care:   [place]   (*CSW will update this form in Epic as items are completed)   11/04/2013  Patient/family provided with [place] Department of Clinical Social Work's list of facilities offering this level of care within the geographic area requested by the patient (or if unable, by the patient's family).  11/04/2013  Patient/family informed of their freedom to choose among providers that offer the needed level of care, that participate in Medicare, Medicaid or managed care program needed by the patient, have an available bed and are willing to accept the patient.  11/04/2013  Patient/family informed of [facility]' ownership interest in [facility], as well as of the fact that they are under no obligation to receive care at this facility.  PASARR submitted to EDS on  PASARR number received on   FL2 transmitted to all facilities in geographic area requested by pt/family on  11/04/2013 FL2 transmitted to all facilities within larger geographic area on   Patient informed that his/her managed care company has contracts with or will negotiate with  certain facilities, including the following:     Patient/family informed of bed offers received:  11/04/2013 Patient chooses bed at  Physician recommends and patient chooses bed at    Patient to be transferred to  on   Patient to be transferred to facility by  Patient and family notified of transfer on  Name of family member notified:    The following physician request were entered in Epic:   Additional Comments:     Liz Beach MSW, [place], [place], [phone]

## 2013-11-05 LAB — CULTURE, BLOOD (ROUTINE X 2)
Culture: NO GROWTH
Culture: NO GROWTH

## 2013-11-20 ENCOUNTER — Inpatient Hospital Stay (HOSPITAL_COMMUNITY): Payer: Medicare Other

## 2013-11-20 ENCOUNTER — Inpatient Hospital Stay (HOSPITAL_COMMUNITY)
Admission: EM | Admit: 2013-11-20 | Discharge: 2013-12-05 | DRG: 682 | Disposition: E | Payer: Medicare Other | Attending: Emergency Medicine | Admitting: Emergency Medicine

## 2013-11-20 ENCOUNTER — Encounter (HOSPITAL_COMMUNITY): Payer: Self-pay | Admitting: Emergency Medicine

## 2013-11-20 DIAGNOSIS — T68XXXA Hypothermia, initial encounter: Secondary | ICD-10-CM

## 2013-11-20 DIAGNOSIS — R5381 Other malaise: Secondary | ICD-10-CM | POA: Diagnosis present

## 2013-11-20 DIAGNOSIS — E039 Hypothyroidism, unspecified: Secondary | ICD-10-CM | POA: Diagnosis present

## 2013-11-20 DIAGNOSIS — G9341 Metabolic encephalopathy: Secondary | ICD-10-CM | POA: Diagnosis present

## 2013-11-20 DIAGNOSIS — N186 End stage renal disease: Secondary | ICD-10-CM | POA: Diagnosis present

## 2013-11-20 DIAGNOSIS — E875 Hyperkalemia: Secondary | ICD-10-CM | POA: Diagnosis present

## 2013-11-20 DIAGNOSIS — Z66 Do not resuscitate: Secondary | ICD-10-CM | POA: Diagnosis present

## 2013-11-20 DIAGNOSIS — Z79899 Other long term (current) drug therapy: Secondary | ICD-10-CM | POA: Diagnosis not present

## 2013-11-20 DIAGNOSIS — I4891 Unspecified atrial fibrillation: Secondary | ICD-10-CM | POA: Diagnosis present

## 2013-11-20 DIAGNOSIS — J9600 Acute respiratory failure, unspecified whether with hypoxia or hypercapnia: Secondary | ICD-10-CM | POA: Diagnosis present

## 2013-11-20 DIAGNOSIS — N179 Acute kidney failure, unspecified: Secondary | ICD-10-CM | POA: Diagnosis present

## 2013-11-20 DIAGNOSIS — Z87891 Personal history of nicotine dependence: Secondary | ICD-10-CM

## 2013-11-20 DIAGNOSIS — F319 Bipolar disorder, unspecified: Secondary | ICD-10-CM | POA: Diagnosis present

## 2013-11-20 DIAGNOSIS — R680 Hypothermia, not associated with low environmental temperature: Secondary | ICD-10-CM | POA: Diagnosis present

## 2013-11-20 DIAGNOSIS — I120 Hypertensive chronic kidney disease with stage 5 chronic kidney disease or end stage renal disease: Secondary | ICD-10-CM | POA: Diagnosis present

## 2013-11-20 DIAGNOSIS — R570 Cardiogenic shock: Secondary | ICD-10-CM | POA: Diagnosis present

## 2013-11-20 DIAGNOSIS — E872 Acidosis, unspecified: Secondary | ICD-10-CM | POA: Diagnosis present

## 2013-11-20 DIAGNOSIS — F259 Schizoaffective disorder, unspecified: Secondary | ICD-10-CM | POA: Diagnosis present

## 2013-11-20 DIAGNOSIS — F411 Generalized anxiety disorder: Secondary | ICD-10-CM | POA: Diagnosis present

## 2013-11-20 DIAGNOSIS — I498 Other specified cardiac arrhythmias: Secondary | ICD-10-CM | POA: Diagnosis present

## 2013-11-20 DIAGNOSIS — F314 Bipolar disorder, current episode depressed, severe, without psychotic features: Secondary | ICD-10-CM

## 2013-11-20 DIAGNOSIS — D638 Anemia in other chronic diseases classified elsewhere: Secondary | ICD-10-CM | POA: Diagnosis present

## 2013-11-20 DIAGNOSIS — K219 Gastro-esophageal reflux disease without esophagitis: Secondary | ICD-10-CM | POA: Diagnosis present

## 2013-11-20 DIAGNOSIS — Z515 Encounter for palliative care: Secondary | ICD-10-CM | POA: Diagnosis not present

## 2013-11-20 DIAGNOSIS — N189 Chronic kidney disease, unspecified: Secondary | ICD-10-CM

## 2013-11-20 DIAGNOSIS — I959 Hypotension, unspecified: Secondary | ICD-10-CM

## 2013-11-20 DIAGNOSIS — R4182 Altered mental status, unspecified: Secondary | ICD-10-CM | POA: Diagnosis present

## 2013-11-20 LAB — URINALYSIS, ROUTINE W REFLEX MICROSCOPIC
GLUCOSE, UA: 250 mg/dL — AB
Ketones, ur: NEGATIVE mg/dL
Nitrite: POSITIVE — AB
PH: 7.5 (ref 5.0–8.0)
PROTEIN: 100 mg/dL — AB
Specific Gravity, Urine: 1.025 (ref 1.005–1.030)
Urobilinogen, UA: 0.2 mg/dL (ref 0.0–1.0)

## 2013-11-20 LAB — URINE MICROSCOPIC-ADD ON

## 2013-11-20 LAB — I-STAT CHEM 8, ED
BUN: >140 mg/dL — ABNORMAL HIGH (ref 6–23)
CALCIUM ION: 1.34 mmol/L — AB (ref 1.13–1.30)
CREATININE: 10.6 mg/dL — AB (ref 0.50–1.10)
Chloride: 109 mEq/L (ref 96–112)
GLUCOSE: 106 mg/dL — AB (ref 70–99)
HEMATOCRIT: 22 % — AB (ref 36.0–46.0)
Hemoglobin: 7.5 g/dL — ABNORMAL LOW (ref 12.0–15.0)
POTASSIUM: 6.8 meq/L — AB (ref 3.7–5.3)
Sodium: 124 mEq/L — ABNORMAL LOW (ref 137–147)
TCO2: 9 mmol/L (ref 0–100)

## 2013-11-20 LAB — POCT I-STAT 3, ART BLOOD GAS (G3+)
Acid-base deficit: 22 mmol/L — ABNORMAL HIGH (ref 0.0–2.0)
Bicarbonate: 7.6 mEq/L — ABNORMAL LOW (ref 20.0–24.0)
O2 SAT: 90 %
PCO2 ART: 30.5 mmHg — AB (ref 35.0–45.0)
Patient temperature: 98.6
TCO2: 9 mmol/L (ref 0–100)
pH, Arterial: 7.005 — CL (ref 7.350–7.450)
pO2, Arterial: 85 mmHg (ref 80.0–100.0)

## 2013-11-20 LAB — CBC WITH DIFFERENTIAL/PLATELET
BASOS ABS: 0 10*3/uL (ref 0.0–0.1)
Basophils Relative: 0 % (ref 0–1)
Eosinophils Absolute: 0.1 10*3/uL (ref 0.0–0.7)
Eosinophils Relative: 1 % (ref 0–5)
HCT: 20.7 % — ABNORMAL LOW (ref 36.0–46.0)
Hemoglobin: 6.8 g/dL — CL (ref 12.0–15.0)
Lymphocytes Relative: 6 % — ABNORMAL LOW (ref 12–46)
Lymphs Abs: 0.3 10*3/uL — ABNORMAL LOW (ref 0.7–4.0)
MCH: 33.3 pg (ref 26.0–34.0)
MCHC: 32.9 g/dL (ref 30.0–36.0)
MCV: 101.5 fL — ABNORMAL HIGH (ref 78.0–100.0)
MONO ABS: 0.2 10*3/uL (ref 0.1–1.0)
MONOS PCT: 4 % (ref 3–12)
NEUTROS ABS: 4.3 10*3/uL (ref 1.7–7.7)
Neutrophils Relative %: 89 % — ABNORMAL HIGH (ref 43–77)
PLATELETS: 180 10*3/uL (ref 150–400)
RBC: 2.04 MIL/uL — ABNORMAL LOW (ref 3.87–5.11)
RDW: 15.9 % — AB (ref 11.5–15.5)
WBC: 4.9 10*3/uL (ref 4.0–10.5)

## 2013-11-20 LAB — TSH: TSH: 0.135 u[IU]/mL — ABNORMAL LOW (ref 0.350–4.500)

## 2013-11-20 LAB — GLUCOSE, CAPILLARY: Glucose-Capillary: 192 mg/dL — ABNORMAL HIGH (ref 70–99)

## 2013-11-20 LAB — PREPARE RBC (CROSSMATCH)

## 2013-11-20 LAB — MAGNESIUM: Magnesium: 2.8 mg/dL — ABNORMAL HIGH (ref 1.5–2.5)

## 2013-11-20 LAB — I-STAT TROPONIN, ED: Troponin i, poc: 0.03 ng/mL (ref 0.00–0.08)

## 2013-11-20 LAB — I-STAT CG4 LACTIC ACID, ED: Lactic Acid, Venous: 0.51 mmol/L (ref 0.5–2.2)

## 2013-11-20 MED ORDER — GLUCAGON HCL RDNA (DIAGNOSTIC) 1 MG IJ SOLR
1.0000 mg | Freq: Once | INTRAMUSCULAR | Status: DC
  Filled 2013-11-20: qty 1

## 2013-11-20 MED ORDER — INSULIN ASPART 100 UNIT/ML ~~LOC~~ SOLN
10.0000 [IU] | Freq: Once | SUBCUTANEOUS | Status: AC
  Administered 2013-11-20: 06:00:00 via INTRAVENOUS
  Filled 2013-11-20: qty 1

## 2013-11-20 MED ORDER — FAMOTIDINE IN NACL 20-0.9 MG/50ML-% IV SOLN
20.0000 mg | Freq: Two times a day (BID) | INTRAVENOUS | Status: DC
  Filled 2013-11-20 (×2): qty 50

## 2013-11-20 MED ORDER — GLUCAGON HCL RDNA (DIAGNOSTIC) 1 MG IJ SOLR
3.0000 mg | Freq: Once | INTRAMUSCULAR | Status: AC
  Administered 2013-11-20: 3 mg via INTRAVENOUS
  Filled 2013-11-20: qty 3

## 2013-11-20 MED ORDER — ASPIRIN 300 MG RE SUPP: 300.0000 mg | RECTAL | Status: DC

## 2013-11-20 MED ORDER — SODIUM CHLORIDE 0.9 % IV SOLN
INTRAVENOUS | Status: DC
  Administered 2013-11-20: 07:00:00 via INTRAVENOUS

## 2013-11-20 MED ORDER — ALBUTEROL SULFATE (2.5 MG/3ML) 0.083% IN NEBU: 10.0000 mg | INHALATION_SOLUTION | Freq: Once | RESPIRATORY_TRACT | Status: AC

## 2013-11-20 MED ORDER — ALBUTEROL (5 MG/ML) CONTINUOUS INHALATION SOLN
INHALATION_SOLUTION | RESPIRATORY_TRACT | Status: AC
  Administered 2013-11-20: 06:00:00
  Filled 2013-11-20: qty 20

## 2013-11-20 MED ORDER — SODIUM CHLORIDE 0.9 % IV SOLN
1.0000 mg/h | INTRAVENOUS | Status: DC
  Administered 2013-11-20: 5 mg/h via INTRAVENOUS
  Filled 2013-11-20: qty 10

## 2013-11-20 MED ORDER — SODIUM CHLORIDE 0.9 % IV SOLN: 250.0000 mL | INTRAVENOUS | Status: DC | PRN

## 2013-11-20 MED ORDER — SODIUM CHLORIDE 0.9 % IV BOLUS (SEPSIS)
1000.0000 mL | Freq: Once | INTRAVENOUS | Status: AC
  Administered 2013-11-20: 1000 mL via INTRAVENOUS

## 2013-11-20 MED ORDER — DOPAMINE-DEXTROSE 3.2-5 MG/ML-% IV SOLN
2.0000 ug/kg/min | INTRAVENOUS | Status: DC
  Administered 2013-11-20: 20 ug/kg/min via INTRAVENOUS

## 2013-11-20 MED ORDER — MORPHINE SULFATE 2 MG/ML IJ SOLN
2.0000 mg | INTRAMUSCULAR | Status: DC | PRN
  Administered 2013-11-20: 4 mg via INTRAVENOUS
  Filled 2013-11-20 (×2): qty 1

## 2013-11-20 MED ORDER — DOPAMINE-DEXTROSE 3.2-5 MG/ML-% IV SOLN
INTRAVENOUS | Status: AC
  Administered 2013-11-20: 800 mg
  Filled 2013-11-20: qty 250

## 2013-11-20 MED ORDER — SODIUM CHLORIDE 0.9 % IV BOLUS (SEPSIS)
1000.0000 mL | Freq: Once | INTRAVENOUS | Status: DC
  Administered 2013-11-20: 1000 mL via INTRAVENOUS

## 2013-11-20 MED ORDER — ATROPINE SULFATE 1 MG/ML IJ SOLN
1.0000 mg | Freq: Once | INTRAMUSCULAR | Status: AC
  Administered 2013-11-20: 1 mg via INTRAVENOUS

## 2013-11-20 MED ORDER — ATROPINE SULFATE 0.1 MG/ML IJ SOLN
INTRAMUSCULAR | Status: AC
  Filled 2013-11-20: qty 10

## 2013-11-20 MED ORDER — ASPIRIN 81 MG PO CHEW: 324.0000 mg | CHEWABLE_TABLET | ORAL | Status: DC

## 2013-11-20 MED ORDER — SODIUM POLYSTYRENE SULFONATE 15 GM/60ML PO SUSP
30.0000 g | Freq: Once | ORAL | Status: DC
  Filled 2013-11-20: qty 120

## 2013-11-20 MED ORDER — CALCIUM CHLORIDE 10 % IV SOLN
1.0000 g | Freq: Once | INTRAVENOUS | Status: AC
  Administered 2013-11-20: 1 g via INTRAVENOUS

## 2013-11-20 MED ORDER — INSULIN ASPART 100 UNIT/ML IV SOLN: 10.0000 [IU] | Freq: Once | INTRAVENOUS | Status: DC

## 2013-11-20 MED ORDER — DEXTROSE 50 % IV SOLN
1.0000 | Freq: Once | INTRAVENOUS | Status: AC
  Administered 2013-11-20: 50 mL via INTRAVENOUS
  Filled 2013-11-20: qty 50

## 2013-11-20 MED ORDER — SODIUM CHLORIDE 0.9 % IV SOLN
1.0000 g | Freq: Once | INTRAVENOUS | Status: DC
  Filled 2013-11-20: qty 10

## 2013-11-20 MED FILL — Medication: Qty: 1 | Status: AC

## 2013-11-23 LAB — URINE CULTURE: Colony Count: >=100000

## 2013-11-23 LAB — TYPE AND SCREEN
ABO/RH(D): O POS
Antibody Screen: NEGATIVE
UNIT DIVISION: 0
Unit division: 0

## 2013-11-23 NOTE — Progress Notes (Signed)
   CSW contacted the Pt's sister Eliezer Mccoy [phone] (sister) and Pt's sister was able to work with [employer] to have funeral costs completely covered.    CSW Mudlogger notified.    [facility]  [address];  [phone] Phone: [phone]

## 2013-11-26 LAB — CULTURE, BLOOD (ROUTINE X 2)
CULTURE: NO GROWTH
Culture: NO GROWTH

## 2013-12-05 NOTE — ED Provider Notes (Signed)
CSN: [id number]     Arrival date & time 12-17-2013  0444 History   First MD Initiated Contact with Patient 12-17-13 0454     Chief Complaint  Patient presents with  . Altered Mental Status     (Consider location/radiation/quality/duration/timing/severity/associated sxs/prior Treatment) HPI 68 yo female presents to the ER from her nursing facility via EMS with report of altered mental status.  Limited history available.  Per EMS, pt was unable to walk to the bathroom as she normally does.  Pt found to be hypotensive, bradycardic.  Pt had DNR from 5/15, but MOST form indicating preference for CPR among others from 7/15.  Pt paced in route, but difficult to capture.  Pt seen by me about 3 weeks ago for similar presentation.  Pt has h/o chronic renal failure, refuses dialysis.  Pt thought to be bradycardic at that time due to beta blocker.  She improved with fluid, time.  Pt unable to give significant history.   Past Medical History  Diagnosis Date  . Constipation   . Bipolar 1 disorder   . Hypertension   . Esophageal reflux   . Anxiety   . Back pain   . Osteoarthritis   . Gout   . Adenomatous polyps 03-27-07  . Diverticulosis   . Internal hemorrhoids   . Hypothyroidism   . Renal insufficiency   . Atrial fibrillation   . CKD (chronic kidney disease), stage IV   . Kyphosis    Past Surgical History  Procedure Laterality Date  . Cholecystectomy    . Appendectomy    . Tonsillectomy    . Tubal ligation     Family History  Problem Relation Age of Onset  . Hypertension Mother   . Heart disease Mother     before age 25  . Hyperlipidemia Mother   . Atrial fibrillation Father   . Colon cancer Neg Hx    History  Substance Use Topics  . Smoking status: Former Smoker    Quit date: 05/08/2003  . Smokeless tobacco: Never Used  . Alcohol Use: No   OB History   Grav Para Term Preterm Abortions TAB SAB Ect Mult Living                 Review of Systems  Unable to perform ROS:  Acuity of condition      Allergies  Review of patient's allergies indicates no known allergies.  Home Medications   Prior to Admission medications   Medication Sig Start Date End Date Taking? Authorizing Provider  acetaminophen (TYLENOL) 500 MG tablet Take 500 mg by mouth every 4 (four) hours as needed for moderate pain.     Historical Provider, MD  ALPRAZolam Duanne Moron) 0.25 MG tablet Take 1 tablet (0.25 mg total) by mouth 4 (four) times daily -  before meals and at bedtime. 11/04/13   Grace Bushy Minor, NP  calcitRIOL (ROCALTROL) 0.25 MCG capsule Take 0.5 mcg by mouth daily.    Historical Provider, MD  Cranberry 425 MG CAPS Take 1 capsule by mouth 2 (two) times daily.    Historical Provider, MD  docusate sodium (COLACE) 100 MG capsule Take 200-300 mg by mouth 2 (two) times daily. 3 caps in am, 2 caps in pm    Historical Provider, MD  ergocalciferol (VITAMIN D2) 50000 UNITS capsule Take 50,000 Units by mouth once a week. On thursday    Historical Provider, MD  famotidine (PEPCID) 20 MG tablet Take 1 tablet (20 mg total) by mouth daily.  11/17/12   Modena Jansky, MD  guaifenesin (ROBITUSSIN) 100 MG/5ML syrup Take 200 mg by mouth 4 (four) times daily as needed for cough.     Historical Provider, MD  hydrALAZINE (APRESOLINE) 25 MG tablet Take 1 tablet (25 mg total) by mouth every 8 (eight) hours. 11/04/13   Grace Bushy Minor, NP  labetalol (NORMODYNE) 100 MG tablet Take 1 tablet (100 mg total) by mouth 2 (two) times daily. 09/14/13   Delfina Redwood, MD  lamoTRIgine (LAMICTAL) 200 MG tablet Take 200 mg by mouth 2 (two) times daily.     Historical Provider, MD  levothyroxine (SYNTHROID, LEVOTHROID) 75 MCG tablet Take 1 tablet (75 mcg total) by mouth daily before breakfast. 11/04/13   Grace Bushy Minor, NP  Linaclotide (LINZESS) 290 MCG CAPS capsule Take 290 mcg by mouth daily.    Historical Provider, MD  magnesium hydroxide (MILK OF MAGNESIA) 400 MG/5ML suspension Take 30 mLs by mouth daily as needed for  constipation.     Historical Provider, MD  meclizine (ANTIVERT) 25 MG tablet Take 25 mg by mouth 3 (three) times daily as needed for dizziness.    Historical Provider, MD  Oxcarbazepine (TRILEPTAL) 300 MG tablet Take 900 mg by mouth 2 (two) times daily.     Historical Provider, MD  polyethylene glycol (MIRALAX / GLYCOLAX) packet Take 17 g by mouth daily.     Historical Provider, MD  risperiDONE (RISPERDAL) 2 MG tablet Take 1 tablet (2 mg total) by mouth 2 (two) times daily. 11/04/13   Grace Bushy Minor, NP  sevelamer carbonate (RENVELA) 800 MG tablet Take 1 tablet (800 mg total) by mouth 3 (three) times daily with meals. 11/17/12   Modena Jansky, MD  sodium phosphate (FLEET) enema Place 1 enema rectally as needed (for constipation, may self-administer.).     Historical Provider, MD   BP 89/27  Pulse 44  Temp(Src) 91.9 F (33.3 C) (Rectal)  Resp 16  Ht 5\' 3"  (1.6 m)  Wt 160 lb (72.576 kg)  BMI 28.35 kg/m2  SpO2 100% Physical Exam  Nursing note and vitals reviewed. Constitutional:  Chronically ill appearing, holds head to right  HENT:  Head: Normocephalic and atraumatic.  Dry mucous membranes  Neck: Normal range of motion. Neck supple. No JVD present. No tracheal deviation present. No thyromegaly present.  Keeps head turned to right  Cardiovascular: Exam reveals no gallop and no friction rub.   Murmur heard. Bradycardia noted  Pulmonary/Chest: Effort normal and breath sounds normal. No stridor. No respiratory distress. She has no wheezes. She has no rales. She exhibits no tenderness.  Abdominal: Soft. Bowel sounds are normal. She exhibits no distension and no mass. There is no tenderness. There is no rebound and no guarding.  Musculoskeletal: She exhibits edema. She exhibits no tenderness.  Lymphadenopathy:    She has no cervical adenopathy.  Neurological: She is alert.  Skin: Skin is warm and dry. No rash noted. No erythema. No pallor.  Cool to touch    ED Course  Procedures  (including critical care time)  CRITICAL CARE Performed by: Kalman Drape Total critical care time: 90 min Critical care time was exclusive of separately billable procedures and treating other patients. Critical care was necessary to treat or prevent imminent or life-threatening deterioration. Critical care was time spent personally by me on the following activities: development of treatment plan with patient and/or surrogate as well as nursing, discussions with consultants, evaluation of patient's response to treatment, examination of patient,  obtaining history from patient or surrogate, ordering and performing treatments and interventions, ordering and review of laboratory studies, ordering and review of radiographic studies, pulse oximetry and re-evaluation of patient's condition.  Labs Review Labs Reviewed  CBC WITH DIFFERENTIAL - Abnormal; Notable for the following:    RBC 2.04 (*)    Hemoglobin 6.8 (*)    HCT 20.7 (*)    MCV 101.5 (*)    RDW 15.9 (*)    Neutrophils Relative % 89 (*)    Lymphocytes Relative 6 (*)    Lymphs Abs 0.3 (*)    All other components within normal limits  TSH - Abnormal; Notable for the following:    TSH 0.135 (*)    All other components within normal limits  MAGNESIUM - Abnormal; Notable for the following:    Magnesium 2.8 (*)    All other components within normal limits  URINALYSIS, ROUTINE W REFLEX MICROSCOPIC - Abnormal; Notable for the following:    Color, Urine BROWN (*)    APPearance TURBID (*)    Glucose, UA 250 (*)    Hgb urine dipstick SMALL (*)    Bilirubin Urine MODERATE (*)    Protein, ur 100 (*)    Nitrite POSITIVE (*)    Leukocytes, UA MODERATE (*)    All other components within normal limits  URINE MICROSCOPIC-ADD ON - Abnormal; Notable for the following:    Bacteria, UA MANY (*)    All other components within normal limits  I-STAT CHEM 8, ED - Abnormal; Notable for the following:    Sodium 124 (*)    Potassium 6.8 (*)    BUN  >140 (*)    Creatinine, Ser 10.60 (*)    Glucose, Bld 106 (*)    Calcium, Ion 1.34 (*)    Hemoglobin 7.5 (*)    HCT 22.0 (*)    All other components within normal limits  POCT I-STAT 3, ART BLOOD GAS (G3+) - Abnormal; Notable for the following:    pH, Arterial 7.005 (*)    pCO2 arterial 30.5 (*)    Bicarbonate 7.6 (*)    Acid-base deficit 22.0 (*)    All other components within normal limits  CULTURE, BLOOD (ROUTINE X 2)  CULTURE, BLOOD (ROUTINE X 2)  URINE CULTURE  I-STAT CG4 LACTIC ACID, ED  I-STAT TROPOININ, ED  PREPARE RBC (CROSSMATCH)  TYPE AND SCREEN    Imaging Review No results found.   EKG Interpretation   Date/Time:  11-28-13 04:52:44 EDT Ventricular Rate:  40 PR Interval:    QRS Duration: 180 QT Interval:  550 QTC Calculation: 449 R Axis:   37 Text Interpretation:  sinus  with 1st degree A-V block Left bundle branch  block Confirmed by Zenas Santa  MD, [place] Lesure ([id number]) on Nov 28, 2013 5:48:39 AM      MDM   Final diagnoses:  Acute renal failure, unspecified acute renal failure type  Hyperkalemia  Hypothermia, initial encounter  Anemia of chronic disease  Hypotension, unspecified hypotension type    68 yo female with bradycardia, hypotension.  Differential includes beta blocker overdose, acute on chronic renal failure with hyperkalemia.  No improvement with atropine, glucagon.  Istat 8 shows K of 6.8, BUN >140, Cr of 10.6.  Fluids started.  Hyperkalemia protocol started.  Pt now requesting dialysis.  D/w Critical care, who requests nephrology consult first.  D/w pt's sister in [place] who reports pt was awaiting appt with nephrology later this month, had been on fluid restriction  for the last several days.   Kalman Drape, MD Dec 04, 2013 [phone]

## 2013-12-05 NOTE — Progress Notes (Signed)
Consult was issued for this pt. Pt is at the end of life. Chaplain visited pt and family. Pt was not responsive, but family member requested prayer. Chaplain provided prayer and spiritual comfort to family member. Chaplain advised family member if further service were needed to have the nursing staff to contact [facility].  Charyl Dancer, Chaplain

## 2013-12-05 NOTE — ED Notes (Signed)
Pt found unresponsive at Nursing home by EMS.  Being paced upon arrival.  Pt alert, able to speak.

## 2013-12-05 NOTE — Progress Notes (Signed)
50 cc of morphine wasted in sink with Tiney Rouge, RN.

## 2013-12-05 NOTE — H&P (Signed)
PULMONARY / CRITICAL CARE MEDICINE   Name: [name] MRN: [id number] DOB: December 15, 1945    ADMISSION DATE:  11/25/13  CHIEF COMPLAINT:  Hyperkalemia, hypotension, encephalopathy  BRIEF PATIENT DESCRIPTION:   SIGNIFICANT EVENTS / STUDIES:   LINES / TUBES: Foley 7/17 >>  CULTURES: Blood 7/17 >>  Urine 7/17 >>   ANTIBIOTICS: None   HISTORY OF PRESENT ILLNESS:  68 yo woman debilitated woman with schizoaffective d/o, bipolar disease, MMP that include HTN, A Fib, and chronic progressive renal failure. She has been evaluated and considered for HD but had decided with [name] not to pursue. This was confirmed on her last hospital admission. She was found to have altered MS and unable to get herself OOB at the SNF early 7/17 am. She presented to [facility] ED with bradycardia, hypotension, confusion. She was treated empirically with glucagon because she had been admitted in past with b-blocker toxicity. Initial eval revealed worsening renal failure and hyperkalemia and she then received Ca, insulin, D50, albuterol, IVF's. Apparently she had reconsidered HD and had indicated that she would be willing to start it, although she cannot have that conversation with me now. Her code status and wishes were changed and documented on a Must form dated 11/04/13. She will be admitted to ICU by PCCM     PAST MEDICAL HISTORY :  Past Medical History  Diagnosis Date  . Constipation   . Bipolar 1 disorder   . Hypertension   . Esophageal reflux   . Anxiety   . Back pain   . Osteoarthritis   . Gout   . Adenomatous polyps 03-27-07  . Diverticulosis   . Internal hemorrhoids   . Hypothyroidism   . Renal insufficiency   . Atrial fibrillation   . CKD (chronic kidney disease), stage IV   . Kyphosis    Past Surgical History  Procedure Laterality Date  . Cholecystectomy    . Appendectomy    . Tonsillectomy    . Tubal ligation     Prior to Admission medications   Medication Sig Start Date End  Date Taking? Authorizing Provider  acetaminophen (TYLENOL) 500 MG tablet Take 500 mg by mouth every 4 (four) hours as needed for moderate pain.    Yes Historical Provider, MD  ALPRAZolam (XANAX) 0.25 MG tablet Take 1 tablet (0.25 mg total) by mouth 4 (four) times daily -  before meals and at bedtime. 11/04/13  Yes Anna Bushy Minor, NP  calcitRIOL (ROCALTROL) 0.25 MCG capsule Take 0.5 mcg by mouth daily.   Yes Historical Provider, MD  Cranberry 425 MG CAPS Take 1 capsule by mouth 2 (two) times daily.   Yes Historical Provider, MD  docusate sodium (COLACE) 100 MG capsule Take 200-300 mg by mouth 2 (two) times daily. 3 capsules every morning and  2 capsules every evening   Yes Historical Provider, MD  ergocalciferol (VITAMIN D2) 50000 UNITS capsule Take 50,000 Units by mouth once a week. On thursday   Yes Historical Provider, MD  famotidine (PEPCID) 20 MG tablet Take 1 tablet (20 mg total) by mouth daily. 11/17/12  Yes Modena Jansky, MD  guaifenesin (ROBITUSSIN) 100 MG/5ML syrup Take 200 mg by mouth 4 (four) times daily as needed for cough.    Yes Historical Provider, MD  hydrALAZINE (APRESOLINE) 25 MG tablet Take 1 tablet (25 mg total) by mouth every 8 (eight) hours. 11/04/13  Yes Anna Bushy Minor, NP  labetalol (NORMODYNE) 100 MG tablet Take 1 tablet (100 mg total) by  mouth 2 (two) times daily. 09/14/13  Yes Delfina Redwood, MD  lamoTRIgine (LAMICTAL) 200 MG tablet Take 200 mg by mouth 2 (two) times daily.    Yes Historical Provider, MD  levothyroxine (SYNTHROID, LEVOTHROID) 75 MCG tablet Take 1 tablet (75 mcg total) by mouth daily before breakfast. 11/04/13  Yes Anna Bushy Minor, NP  Linaclotide (LINZESS) 290 MCG CAPS capsule Take 290 mcg by mouth daily.   Yes Historical Provider, MD  magnesium hydroxide (MILK OF MAGNESIA) 400 MG/5ML suspension Take 30 mLs by mouth daily as needed for constipation.    Yes Historical Provider, MD  meclizine (ANTIVERT) 25 MG tablet Take 25 mg by mouth 3 (three) times daily  as needed for dizziness.   Yes Historical Provider, MD  Oxcarbazepine (TRILEPTAL) 300 MG tablet Take 900 mg by mouth 2 (two) times daily.    Yes Historical Provider, MD  polyethylene glycol (MIRALAX / GLYCOLAX) packet Take 17 g by mouth daily.    Yes Historical Provider, MD  risperiDONE (RISPERDAL) 2 MG tablet Take 1 tablet (2 mg total) by mouth 2 (two) times daily. 11/04/13  Yes Anna Bushy Minor, NP  sevelamer carbonate (RENVELA) 800 MG tablet Take 1 tablet (800 mg total) by mouth 3 (three) times daily with meals. 11/17/12  Yes Modena Jansky, MD  sodium phosphate (FLEET) enema Place 1 enema rectally as needed (for constipation, may self-administer.).    Yes Historical Provider, MD   No Known Allergies  FAMILY HISTORY:  Family History  Problem Relation Age of Onset  . Hypertension Mother   . Heart disease Mother     before age 62  . Hyperlipidemia Mother   . Atrial fibrillation Father   . Colon cancer Neg Hx    SOCIAL HISTORY:  reports that she quit smoking about 10 years ago. She has never used smokeless tobacco. She reports that she does not drink alcohol or use illicit drugs. HCPOA is her sister [name]   REVIEW OF SYSTEMS:  Pt c/o back and abd pain, otherwise unable to give a full hx of sx due to confusion  SUBJECTIVE:   VITAL SIGNS: Temp:  [91.9 F (33.3 C)] 91.9 F (33.3 C) (07/17 0526) Pulse Rate:  [44] 44 (07/17 0500) Resp:  [16-19] 16 (07/17 0500) BP: (86-89)/(24-27) 89/27 mmHg (07/17 0500) SpO2:  [100 %] 100 % (07/17 0546) Weight:  [72.576 kg (160 lb)] 72.576 kg (160 lb) (07/17 0454) HEMODYNAMICS:   VENTILATOR SETTINGS:   INTAKE / OUTPUT: Intake/Output   None     PHYSICAL EXAMINATION: General:  Debilitated, ill-appearing woman,  Neuro:  Awake, slurred speech but answers questions, moves all ext HEENT:  OP clear, disconjugate gaze with some mild exophthalmos,  Cardiovascular:  Bradycardic, regular, no M Lungs:  Clear anteriorly Abdomen:  Soft, obese, NT,  hypoactive BS Musculoskeletal:  No deformities, trace LE edema Skin:  No rash or notable lesions  LABS:  CBC  Recent Labs Lab Dec 15, 2013 0504 15-Dec-2013 0513  WBC 4.9  --   HGB 6.8* 7.5*  HCT 20.7* 22.0*  PLT 180  --    Coag's No results found for this basename: APTT, INR,  in the last 168 hours BMET  Recent Labs Lab 2013-12-15 0513  NA 124*  K 6.8*  CL 109  BUN >140*  CREATININE 10.60*  GLUCOSE 106*   Electrolytes  Recent Labs Lab 12/15/2013 0504  MG 2.8*   Sepsis Markers  Recent Labs Lab 2013-12-15 0513  LATICACIDVEN 0.51   ABG No  results found for this basename: PHART, PCO2ART, PO2ART,  in the last 168 hours Liver Enzymes No results found for this basename: AST, ALT, ALKPHOS, BILITOT, ALBUMIN,  in the last 168 hours Cardiac Enzymes No results found for this basename: TROPONINI, PROBNP,  in the last 168 hours Glucose No results found for this basename: GLUCAP,  in the last 168 hours  Imaging No results found.   CXR: pending  ASSESSMENT / PLAN:  RENAL A:  Acute on chronic renal failure, clearly end-stage and now with life-threatening metabolic disarray  P:   - I spoke with the pt's sister and HCPOA this am. She understands the complexity of this issue whether to commit to HD or not. The pt has stated recently to her sister and to her healthcare providers that she would be willing to do HD, but her sister believes that Anzleigh lacks the insight into what this means for her life and future. She will not be able to tolerate or emotionally handle HD 3 times a week. I have spoken to [name] and she has counseled Donata and [name] in this direction. We agreed that Ms Feldt should NOT under go HD and that we should work to make her comfortable. I fully support this decision. Will leave her on dopamine for now as [name] is on her way to [place] and I would like her to see her sister.  DNR orders will be placed.   PULMONARY A: hypoxemia  P:   - O2 in  place  CARDIOVASCULAR A: shock, due to metabolic acidosis, bradycardia P:  - supporting with dopamine and IVF for now; goal will be to keep her stable and comfortable for her sister to arrive  HEMATOLOGIC A:  anemia P:  - PRBC's were ordered in the ED, will defer these  INFECTIOUS A:  No evidence acute infxn P:   - cultures drawn  NEUROLOGIC A:  Encephalopathy  Pain P:   RASS goal: 0 - will use morphine prn, consider transition to morphine gtt if we are unable to control pain adequately   I have personally obtained a history, examined the patient, evaluated laboratory and imaging results, formulated the assessment and plan and placed orders. CRITICAL CARE: The patient is critically ill with multiple organ systems failure and requires high complexity decision making for assessment and support, frequent evaluation and titration of therapies, application of advanced monitoring technologies and extensive interpretation of multiple databases. Critical Care Time devoted to patient care services described in this note is 80 minutes.    Baltazar Apo, MD, PhD 2013/11/28, 7:56 AM [facility] [phone] or if no answer [phone]

## 2013-12-05 NOTE — Progress Notes (Signed)
UR Completed.  [facility] [phone] 11/21/13

## 2013-12-05 NOTE — ED Notes (Signed)
No noticeable change in HR s/p atropine.

## 2013-12-05 NOTE — Progress Notes (Signed)
Clinical Social Work Department BRIEF PSYCHOSOCIAL ASSESSMENT 2013/12/20  Patient:  [name], [name]     Account Number:  [id number]     Admit date:  12-20-2013  Clinical Social Worker:  Pete Pelt, CLINICAL SOCIAL WORKER  Date/Time:  December 20, 2013 12:00 M  Referred by:  Physician  Date Referred:  12-20-2013 Referred for  Other - See comment   Other Referral:   Cremation information and comfort care support for family.   Interview type:  Family Other interview type:   CSW met with Pt's sister outside of Pts room.    Eliezer Mccoy  [phone]  (sister)    PSYCHOSOCIAL DATA Living Status:  FACILITY Admitted from facility:  [facility] Level of care:  [place] Primary support name:  Eliezer Mccoy  [phone]  (sister) Primary support relationship to patient:  [place] Degree of support available:   Pt has good support system from [facility] prior to going to [facility] for rehab.    CURRENT CONCERNS Current Concerns  Other - See comment  Financial Resources   Other Concerns:   Pt's sister would like information on cremation and bereivnent services. Pt's sister believes that DSS would be able to pay for burial.    [place] / PLAN CSW met with the Pt's sister outside of the Pt's room. CSW introduced self and reason for meeting. Pt's sister provided name.  Per chart and sister, Pt past just minutes prior (1:11pm) to [place] meeting with Pt's sister. CSW expressed condollences and provided the information provided to Pt's sister. Pt's sister explained to CSW that she and Pt's husband are the only family members and the family would not be able to pay for cremation expences. Pt's sister was extremely concerned about limited funds and stated that "she could maybe come up with $100 or $200 dollars for cremation but does not know what to do about the rest of the money. CSW consulted with another CSW and  Mudlogger. CSW then revisted topic with Pt's sister and gave other options for sister to consider for fund raising and then return a call to CSW with amount of funds raised. CSW made no promises for coverage of additional funds coverage, however only to offer support and give other alternative options for fund raising. CSW will contact Pt's sister on Monday to assess further.   Assessment/plan status:  Information/Referral to [employer] Other assessment/ plan:   Information/referral to community resources:   Resources for grief and Agricultural engineer.    PATIENT'S/FAMILY'S RESPONSE TO PLAN OF CARE: Pt's sister was appreciative of assistance and was going to speak with the Pts spouse to inform him of Pt's death. CSW also informed both [facility] and [facility] of Pt's passing and that Pt's sister would be the one to speak with the spouse, facilities voiced understanding. CSW provided support and solution focus counseling.      [facility]  [address];  [phone] Phone: [phone]

## 2013-12-05 NOTE — Progress Notes (Signed)
[name] is well known to me from the OP setting and I did see her during her last admit.  There have been discussions around with possibly starting [name] on dialysis ( even though discussions in the past have led to decision to not do dialysis)  and there was an appt scheduled with [facility] to further discuss.  I have come to the determination that chronic dialysis would not be appropriate in this setting due to reasons noted in my consult note from 6/26.  I have discussed this with the patient's sister [name] and she is in agreement.  She is open to a comfort care approach for Anna. Koziol which i support as well.  I will not inform the renal consult team of this admission due to decisions made.   [name] A

## 2013-12-05 NOTE — ED Notes (Signed)
MD at bedside. Dr. Lamonte Sakai at bedside. Dr. Lamonte Sakai wants dopamine hung using current piv; hold blood for now.

## 2013-12-05 NOTE — ED Notes (Signed)
Dr. Sharol Given talks to Sister.

## 2013-12-05 NOTE — Progress Notes (Signed)
Pt DNR on comfort care. Pt became bradycardic then asystole. No breath sounds or heart sounds auscultated. Pupils fixed, dilated, and nonreactive. Verified with Dario Ave, RN. Family at bedside. [facility] notified. [place] notified. Time of death 42.

## 2013-12-05 DEATH — deceased

## 2013-12-11 NOTE — Discharge Summary (Signed)
PULMONARY / CRITICAL CARE MEDICINE DEATH SUMMARY   Name: [name] MRN: [id number] DOB: 1946/02/27    ADMISSION DATE:  December 07, 2013 DATE OF DEATH: 2013/12/07  CHIEF COMPLAINT:  Hyperkalemia, hypotension, encephalopathy  FINAL CAUSE OF DEATH:  Acute on chronic (end-stage) renal failure  SECONDARY CAUSES OF DEATH:  Schizoaffective disorder Bipolar disorder HTN Atrial fibrillation Hypokalemia  Bradycardia with Cardiogenic shock Metabolic  Acidosis Chronic anemia of chronic disease Metabolic encephalopathy Hypoxemia respiratory failure   LINES / TUBES: Foley 12-08-22 >>  CULTURES: Blood 08-Dec-2022 >>  Urine 12-08-22 >>   ANTIBIOTICS: None   BRIEF Mcmahon COURSE:  68 yo woman debilitated woman with schizoaffective d/o, bipolar disease, MMP that include HTN, A Fib, and chronic progressive renal failure. She has been evaluated and considered for HD (both as an outpatient and on previos Mcmahon admissions) but had decided with Dr [name] not to pursue.  She was found December 07, 2013 to have altered Anna and unable to get herself OOB at the SNF early am. She presented to [facility] ED with bradycardia, hypotension, confusion. She was treated empirically with glucagon because she had been admitted in past with b-blocker toxicity. Initial lab eval revealed worsening renal failure and hyperkalemia, and she then received Ca, insulin, D50, albuterol, IVF's. Apparently per outpatient notes she had reconsidered HD and had indicated that she would be willing to start it, although she could not have that conversation in the ED. Her code status and wishes were changed and documented on a Must form dated 11/04/13. She was admitted with clear instability with respiratory compromise, shock and life-threatening renal failure. If she were to survive, she would have needed MV, emergent HD, aggressive care that would commit her to chronic HD and possibly to LTAC. In conversation with the patient's sister 2022/12/08, she understood the  complexity of this issue whether to commit to HD or not. The pt has stated recently to her sister and to her healthcare providers that she would be willing to do HD, but her sister believes that [name] lacked the insight into what this means for her life and future.  She would not have been able to tolerate or emotionally handle HD 3 times a week. Dr [name] counseled [name] and [name] in this direction and agreed with transition to comfort care on 2022-12-08. We agreed that [name] would NOT under go HD and that we would work to make her comfortable. She was left on dopamine to give her sister [name] a chance to get to [name] to see her. She died later that same day 2013-12-07.   PAST MEDICAL HISTORY :  Past Medical History  Diagnosis Date  . Constipation   . Bipolar 1 disorder   . Hypertension   . Esophageal reflux   . Anxiety   . Back pain   . Osteoarthritis   . Gout   . Adenomatous polyps 03-27-07  . Diverticulosis   . Internal hemorrhoids   . Hypothyroidism   . Renal insufficiency   . Atrial fibrillation   . CKD (chronic kidney disease), stage IV   . Kyphosis    Past Surgical History  Procedure Laterality Date  . Cholecystectomy    . Appendectomy    . Tonsillectomy    . Tubal ligation     Prior to Admission medications   Medication Sig Start Date End Date Taking? Authorizing Provider  acetaminophen (TYLENOL) 500 MG tablet Take 500 mg by mouth every 4 (four) hours as needed for moderate pain.  Yes Historical Provider, MD  ALPRAZolam (XANAX) 0.25 MG tablet Take 1 tablet (0.25 mg total) by mouth 4 (four) times daily -  before meals and at bedtime. 11/04/13  Yes Grace Bushy Minor, NP  calcitRIOL (ROCALTROL) 0.25 MCG capsule Take 0.5 mcg by mouth daily.   Yes Historical Provider, MD  Cranberry 425 MG CAPS Take 1 capsule by mouth 2 (two) times daily.   Yes Historical Provider, MD  docusate sodium (COLACE) 100 MG capsule Take 200-300 mg by mouth 2 (two) times daily. 3 capsules  every morning and  2 capsules every evening   Yes Historical Provider, MD  ergocalciferol (VITAMIN D2) 50000 UNITS capsule Take 50,000 Units by mouth once a week. On thursday   Yes Historical Provider, MD  famotidine (PEPCID) 20 MG tablet Take 1 tablet (20 mg total) by mouth daily. 11/17/12  Yes Modena Jansky, MD  guaifenesin (ROBITUSSIN) 100 MG/5ML syrup Take 200 mg by mouth 4 (four) times daily as needed for cough.    Yes Historical Provider, MD  hydrALAZINE (APRESOLINE) 25 MG tablet Take 1 tablet (25 mg total) by mouth every 8 (eight) hours. 11/04/13  Yes Grace Bushy Minor, NP  labetalol (NORMODYNE) 100 MG tablet Take 1 tablet (100 mg total) by mouth 2 (two) times daily. 09/14/13  Yes Delfina Redwood, MD  lamoTRIgine (LAMICTAL) 200 MG tablet Take 200 mg by mouth 2 (two) times daily.    Yes Historical Provider, MD  levothyroxine (SYNTHROID, LEVOTHROID) 75 MCG tablet Take 1 tablet (75 mcg total) by mouth daily before breakfast. 11/04/13  Yes Grace Bushy Minor, NP  Linaclotide (LINZESS) 290 MCG CAPS capsule Take 290 mcg by mouth daily.   Yes Historical Provider, MD  magnesium hydroxide (MILK OF MAGNESIA) 400 MG/5ML suspension Take 30 mLs by mouth daily as needed for constipation.    Yes Historical Provider, MD  meclizine (ANTIVERT) 25 MG tablet Take 25 mg by mouth 3 (three) times daily as needed for dizziness.   Yes Historical Provider, MD  Oxcarbazepine (TRILEPTAL) 300 MG tablet Take 900 mg by mouth 2 (two) times daily.    Yes Historical Provider, MD  polyethylene glycol (MIRALAX / GLYCOLAX) packet Take 17 g by mouth daily.    Yes Historical Provider, MD  risperiDONE (RISPERDAL) 2 MG tablet Take 1 tablet (2 mg total) by mouth 2 (two) times daily. 11/04/13  Yes Grace Bushy Minor, NP  sevelamer carbonate (RENVELA) 800 MG tablet Take 1 tablet (800 mg total) by mouth 3 (three) times daily with meals. 11/17/12  Yes Modena Jansky, MD  sodium phosphate (FLEET) enema Place 1 enema rectally as needed (for  constipation, may self-administer.).    Yes Historical Provider, MD   No Known Allergies  FAMILY HISTORY:  Family History  Problem Relation Age of Onset  . Hypertension Mother   . Heart disease Mother     before age 24  . Hyperlipidemia Mother   . Atrial fibrillation Father   . Colon cancer Neg Hx      Baltazar Apo, MD, PhD 12/11/2013, 1:03 PM [facility] [phone] or if no answer [phone]

## 2014-07-01 IMAGING — CR DG HAND COMPLETE 3+V*R*
3 series · 3 of 3 positions shown · non-contrast
Comparison: None.

CLINICAL DATA: Status post fall; bruising about the right wrist.

EXAM:
RIGHT HAND - COMPLETE 3+ VIEW

[x hand pa right]
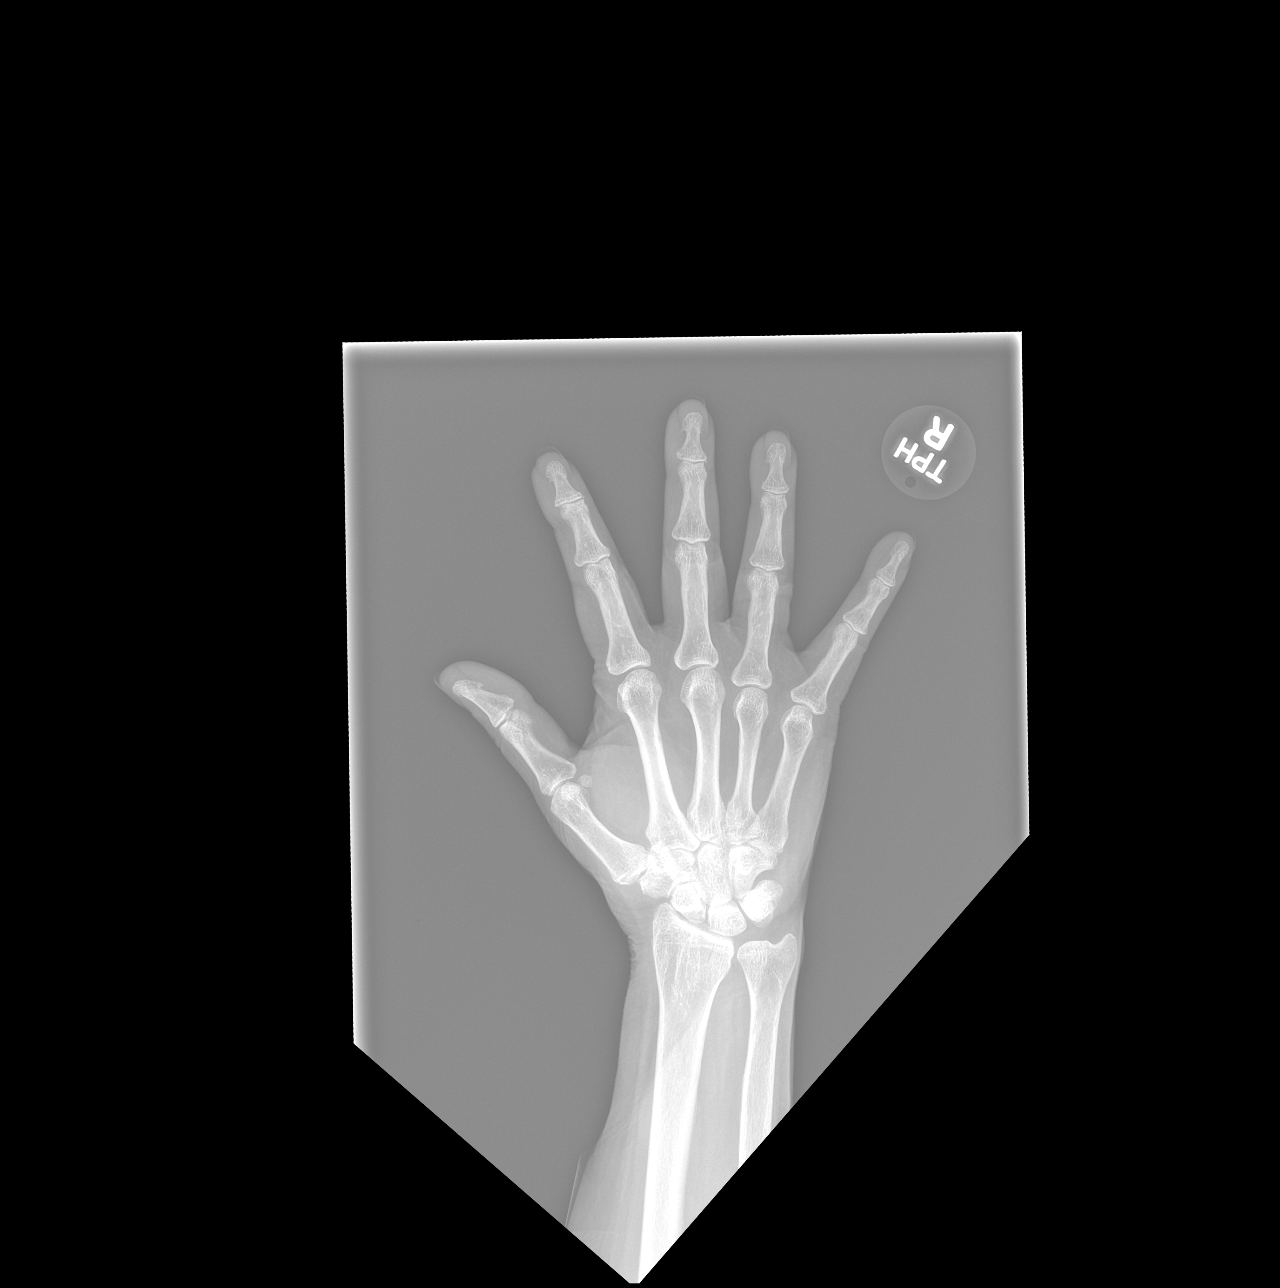

[x hand obl right]
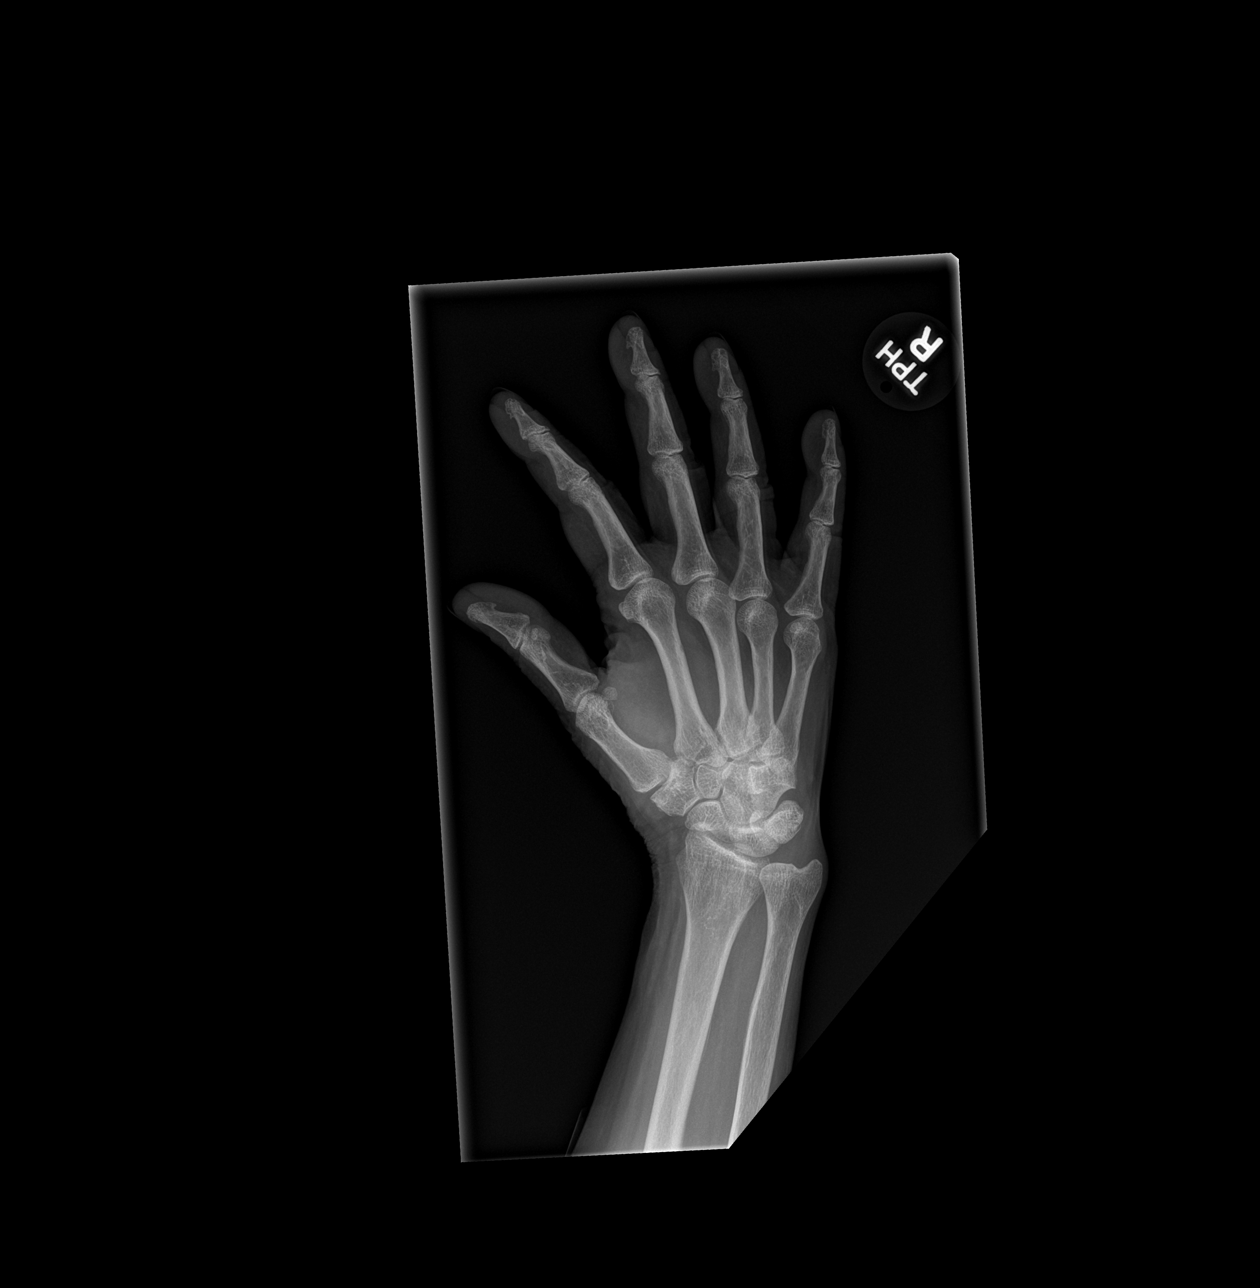

[x hand lat right]
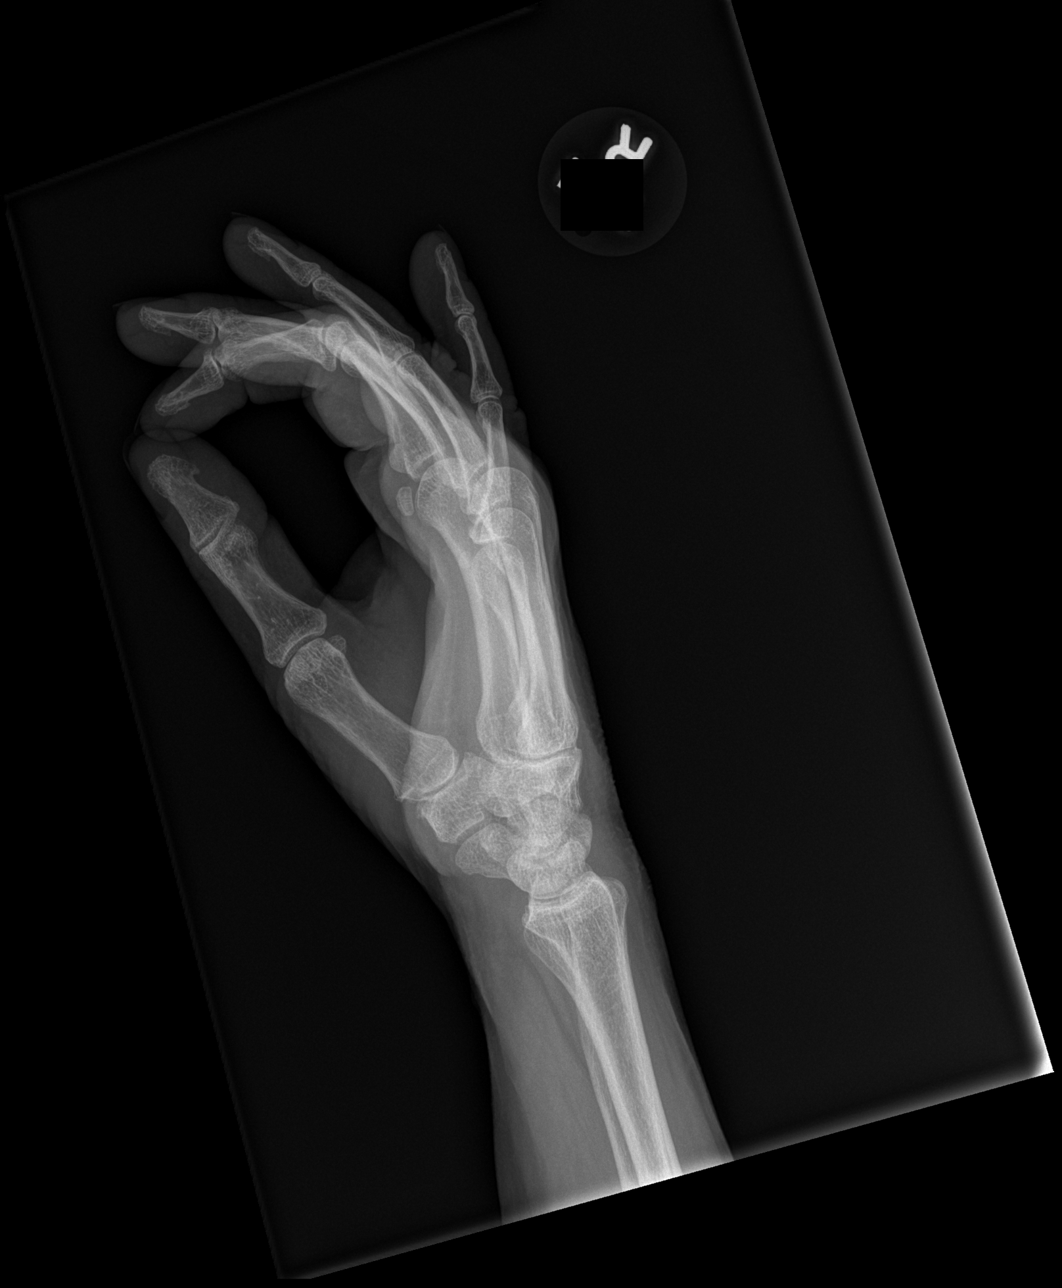

[3 of 3 positions shown; findings below may reference images not displayed]

FINDINGS: There is no evidence of fracture or dislocation. The joint spaces
are preserved; the soft tissues are unremarkable in appearance. The
carpal rows are intact, and demonstrate normal alignment.
IMPRESSION: No evidence of fracture or dislocation.

## 2014-07-01 IMAGING — CR DG SHOULDER 2+V*R*
4 series · 4 of 4 positions shown · non-contrast
Comparison: Chest radiograph from 06/13/2013

CLINICAL DATA: Status post fall; generalized right shoulder pain.

EXAM:
RIGHT SHOULDER - 2+ VIEW

[x shoulder ap right (1 of 2)]
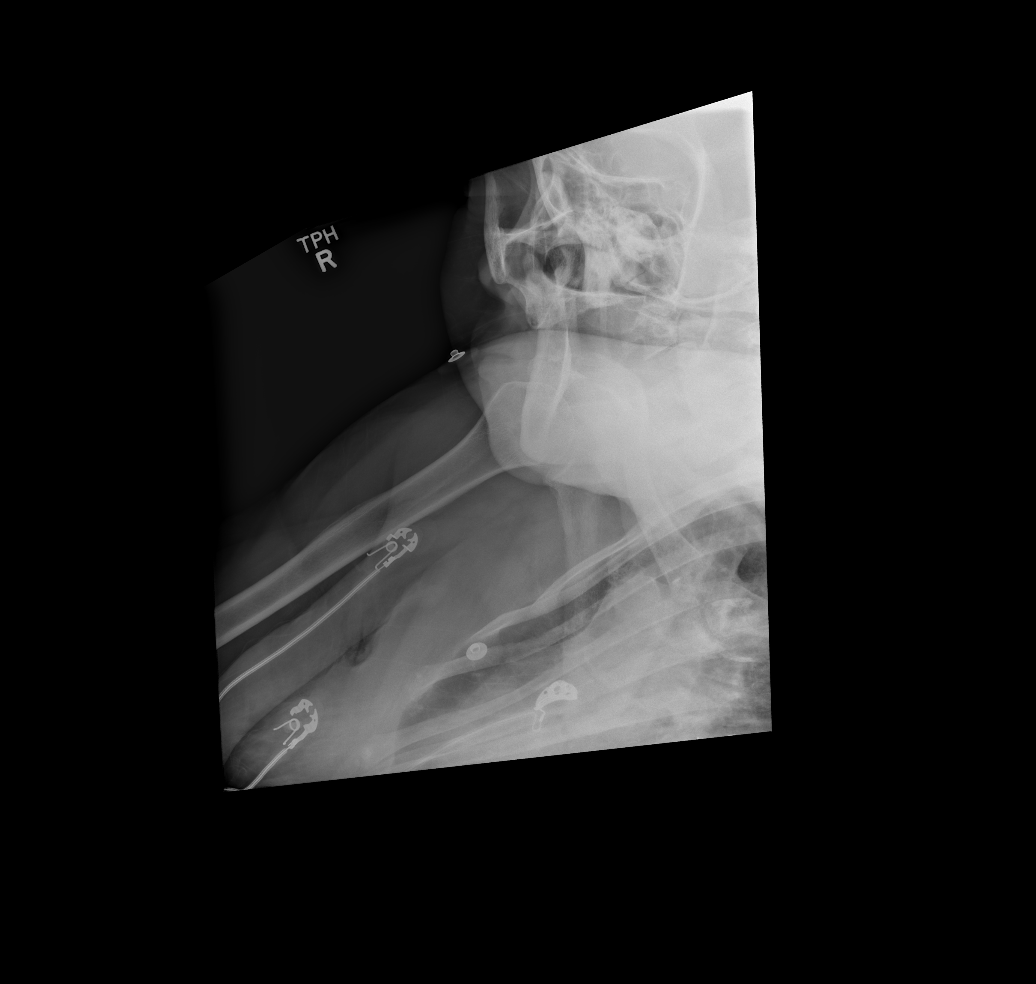

[x shoulder ap right (2 of 2)]
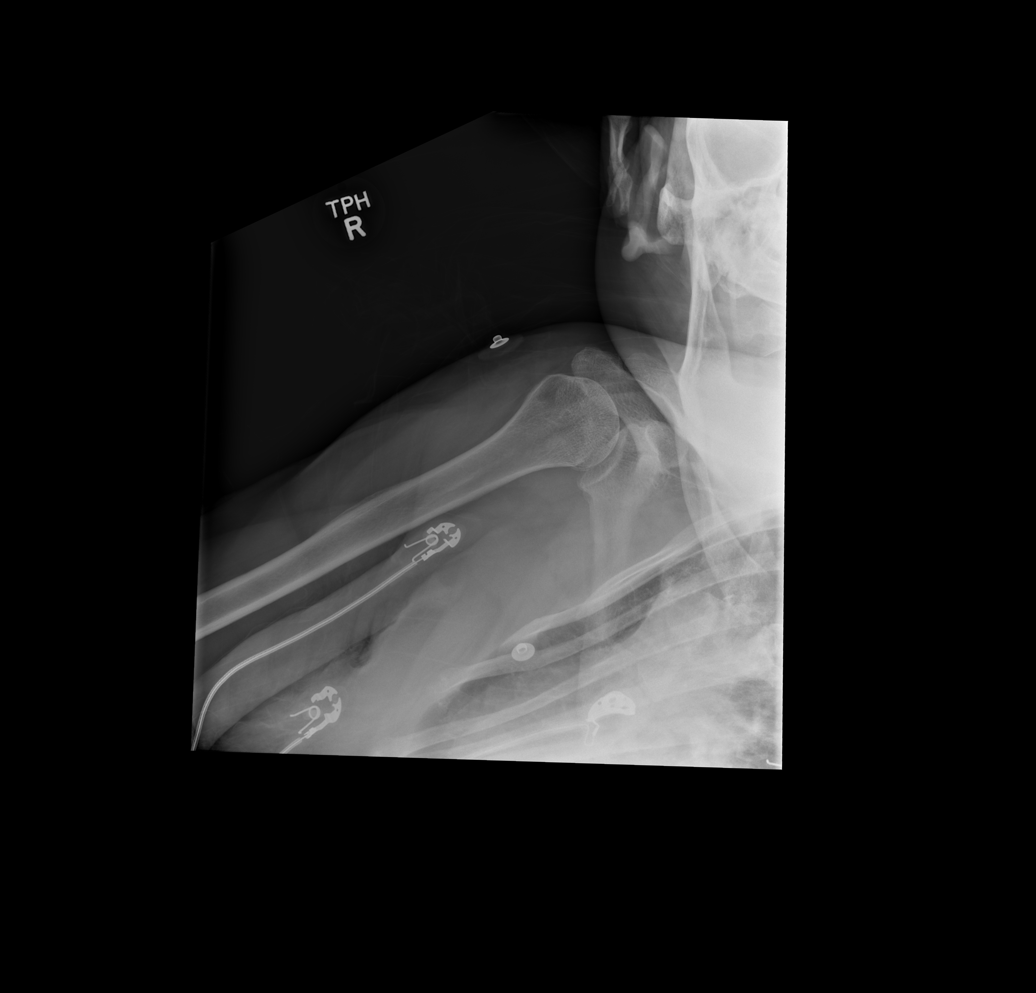

[x scapula y-view right (1 of 2)]
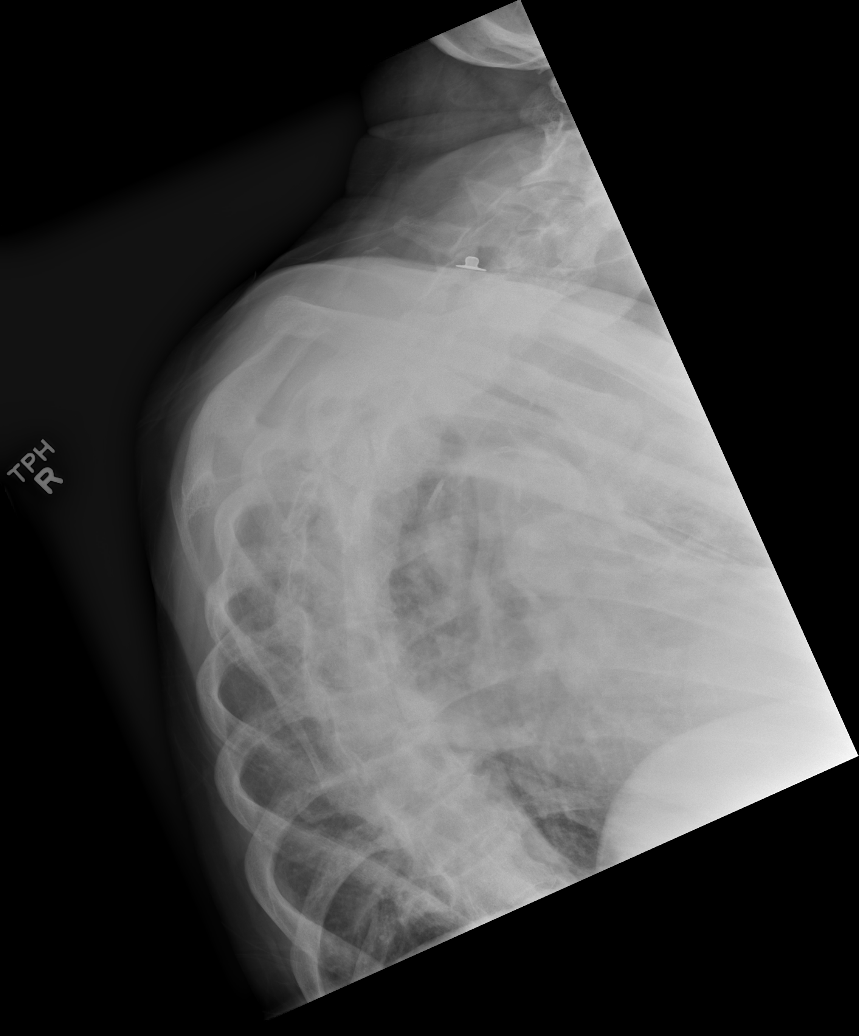

[x scapula y-view right (2 of 2)]
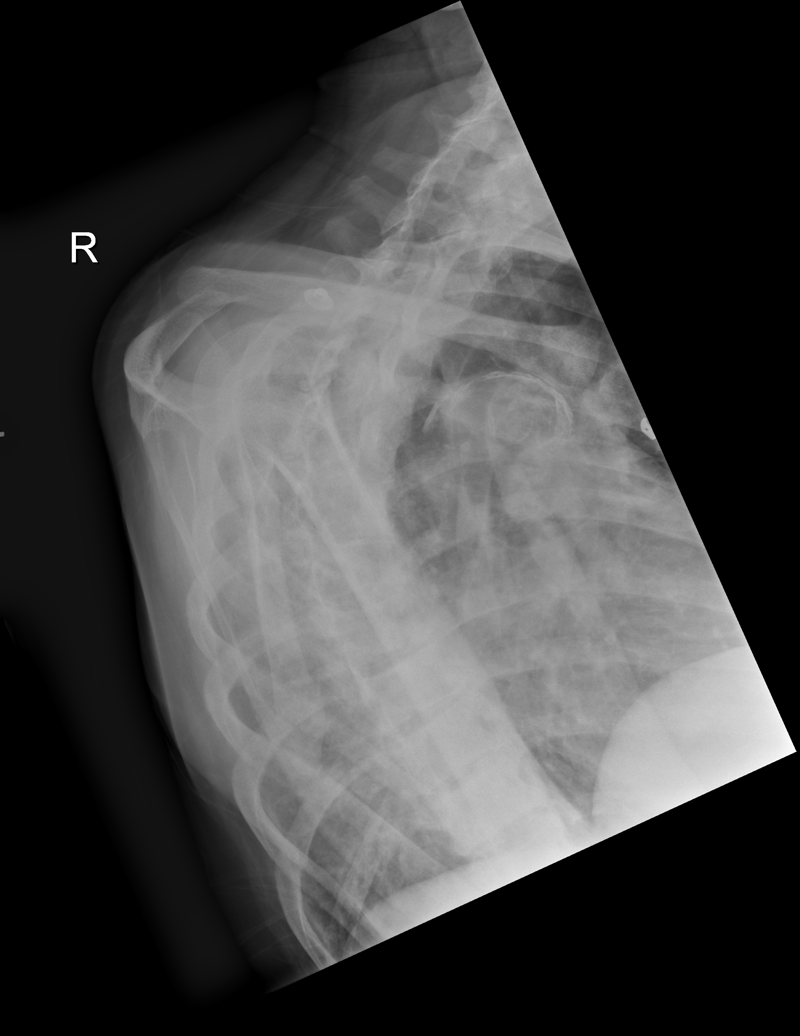

[4 of 4 positions shown; findings below may reference images not displayed]

FINDINGS: There is no evidence of fracture or dislocation. The right humeral
head is seated within the glenoid fossa. The acromioclavicular joint
is unremarkable in appearance. No significant soft tissue
abnormalities are seen. The visualized portions of the right lung
are clear.
IMPRESSION: No evidence of fracture or dislocation.

## 2021-10-25 ENCOUNTER — Other Ambulatory Visit (HOSPITAL_COMMUNITY): Payer: Self-pay
